# Patient Record
Sex: Female | Born: 1970
Health system: Southern US, Community
[De-identification: ages and names within clinical notes are randomized; demographics above are authoritative.]

## PROBLEM LIST (undated history)

## (undated) DIAGNOSIS — D649 Anemia, unspecified: Secondary | ICD-10-CM

## (undated) DIAGNOSIS — Z853 Personal history of malignant neoplasm of breast: Secondary | ICD-10-CM

## (undated) DIAGNOSIS — C50919 Malignant neoplasm of unspecified site of unspecified female breast: Secondary | ICD-10-CM

## (undated) DIAGNOSIS — M810 Age-related osteoporosis without current pathological fracture: Principal | ICD-10-CM

## (undated) DIAGNOSIS — L509 Urticaria, unspecified: Secondary | ICD-10-CM

## (undated) DIAGNOSIS — K219 Gastro-esophageal reflux disease without esophagitis: Secondary | ICD-10-CM

## (undated) DIAGNOSIS — E785 Hyperlipidemia, unspecified: Secondary | ICD-10-CM

## (undated) DIAGNOSIS — M419 Scoliosis, unspecified: Secondary | ICD-10-CM

## (undated) DIAGNOSIS — Z5189 Encounter for other specified aftercare: Secondary | ICD-10-CM

## (undated) DIAGNOSIS — C801 Malignant (primary) neoplasm, unspecified: Secondary | ICD-10-CM

## (undated) HISTORY — DX: Encounter for other specified aftercare: Z51.89

## (undated) HISTORY — DX: Age-related osteoporosis without current pathological fracture: M81.0

## (undated) HISTORY — DX: Malignant neoplasm of unspecified site of unspecified female breast: C50.919

## (undated) HISTORY — DX: Hyperlipidemia, unspecified: E78.5

## (undated) HISTORY — DX: Urticaria, unspecified: L50.9

## (undated) HISTORY — DX: Personal history of malignant neoplasm of breast: Z85.3

## (undated) HISTORY — PX: MASTECTOMY: SHX3

## (undated) HISTORY — PX: WISDOM TOOTH EXTRACTION: SHX21

## (undated) HISTORY — DX: Malignant (primary) neoplasm, unspecified: C80.1

## (undated) HISTORY — PX: BREAST SURGERY: SHX581

## (undated) HISTORY — PX: REDUCTION MAMMAPLASTY: SUR839

---

## 2004-01-26 ENCOUNTER — Ambulatory Visit (HOSPITAL_COMMUNITY): Admission: RE | Admit: 2004-01-26 | Discharge: 2004-01-26 | Payer: Self-pay | Admitting: Obstetrics and Gynecology

## 2011-08-02 ENCOUNTER — Encounter: Payer: Self-pay | Admitting: Physical Medicine and Rehabilitation

## 2011-08-02 ENCOUNTER — Emergency Department (HOSPITAL_COMMUNITY): Payer: 59

## 2011-08-02 ENCOUNTER — Emergency Department (HOSPITAL_COMMUNITY)
Admission: EM | Admit: 2011-08-02 | Discharge: 2011-08-02 | Disposition: A | Discharge status: Home / Self Care | Payer: 59 | Attending: Emergency Medicine | Admitting: Emergency Medicine

## 2011-08-02 DIAGNOSIS — M549 Dorsalgia, unspecified: Secondary | ICD-10-CM

## 2011-08-02 DIAGNOSIS — M545 Low back pain, unspecified: Secondary | ICD-10-CM | POA: Insufficient documentation

## 2011-08-02 DIAGNOSIS — M4120 Other idiopathic scoliosis, site unspecified: Secondary | ICD-10-CM | POA: Insufficient documentation

## 2011-08-02 HISTORY — DX: Scoliosis, unspecified: M41.9

## 2011-08-02 MED ORDER — ONDANSETRON 4 MG PO TBDP
ORAL_TABLET | ORAL | Status: AC
  Administered 2011-08-02: 4 mg
  Filled 2011-08-02: qty 1

## 2011-08-02 MED ORDER — OXYCODONE-ACETAMINOPHEN 5-325 MG PO TABS: 2.0000 | ORAL_TABLET | ORAL | Status: AC | PRN

## 2011-08-02 MED ORDER — PREDNISONE 10 MG PO TABS: 20.0000 mg | ORAL_TABLET | Freq: Every day | ORAL | Status: DC

## 2011-08-02 MED ORDER — OXYCODONE-ACETAMINOPHEN 5-325 MG PO TABS
2.0000 | ORAL_TABLET | Freq: Once | ORAL | Status: AC
  Administered 2011-08-02: 2 via ORAL
  Filled 2011-08-02: qty 2

## 2011-08-02 MED ORDER — ONDANSETRON 4 MG PO TBDP: 4.0000 mg | ORAL_TABLET | Freq: Once | ORAL | Status: DC

## 2011-08-02 MED ORDER — KETOROLAC TROMETHAMINE 30 MG/ML IJ SOLN
60.0000 mg | Freq: Once | INTRAMUSCULAR | Status: AC
  Administered 2011-08-02: 60 mg via INTRAMUSCULAR
  Filled 2011-08-02: qty 2

## 2011-08-02 NOTE — ED Notes (Addendum)
Pt presents to department for evaluation of lower back pain. Onset this morning. Pt states she got into car and began driving when pain started. 10/10 pain upon arrival to ED. States too painful to walk or bear weight at the time. Pt states she usually doesn't have back problems. She is conscious alert and oriented x4. Denies recent injury to area. Pt has history of scoliosis.

## 2011-08-02 NOTE — ED Notes (Signed)
Pt states she has a history of scolilosis.Marland Kitchenstates she cannot stand due pain. Denies numbness to her legs or weakness in her legs. Denies loss of bowel or bladder control.

## 2011-08-02 NOTE — ED Provider Notes (Signed)
History     CSN: [id number] Arrival date & time: 08/02/2011  6:00 PM   First MD Initiated Contact with Patient 08/02/11 1854      Chief Complaint  Patient presents with  . Back Pain    (Consider location/radiation/quality/duration/timing/severity/associated sxs/prior treatment) HPI  Past Medical History  Diagnosis Date  . Scoliosis     History reviewed. No pertinent past surgical history.  History reviewed. No pertinent family history.  History  Substance Use Topics  . Smoking status: Never Smoker   . Smokeless tobacco: Not on file  . Alcohol Use: No    OB History    Grav Para Term Preterm Abortions TAB SAB Ect Mult Living                  Review of Systems  Allergies  Review of patient's allergies indicates no known allergies.  Home Medications   Current Outpatient Rx  Name Route Sig Dispense Refill  . ACETAMINOPHEN-ASPIRIN BUFFERED 250-250 MG PO TABS Oral Take 1 tablet by mouth every 4 (four) hours as needed. For pain     . CYCLOBENZAPRINE HCL 10 MG PO TABS Oral Take 10 mg by mouth 3 (three) times daily as needed. For muscle spasms     . NAPROXEN 250 MG PO TABS Oral Take 250 mg by mouth 2 (two) times daily as needed. For pain and inflammation       BP 119/76  Pulse 85  Temp(Src) 99 F (37.2 C) (Oral)  Resp 22  SpO2 100%  Physical Exam  ED Course  Procedures (including critical care time)  Labs Reviewed - No data to display Dg Lumbar Spine 2-3 Views  08/02/2011  *RADIOLOGY REPORT*  Clinical Data: Low back pain x1 day, scoliosis  LUMBAR SPINE - 2-3 VIEW  Comparison: None.  Findings: Five lumbar-type vertebral bodies with a partially lumbarized S1.  Normal lumbar lordosis.  Thoracolumbar scoliosis.  No evidence fracture of dislocation.  Vertebral body heights and interverebral disc spaces are maintained.  IMPRESSION: No fracture or dislocation is seen.  Thoracolumbar scoliosis.  Original Report Authenticated By: Charline Bills, M.D.     No  diagnosis found.  Radiology results reviewed and discussed with patient.  MDM         Jimmye Norman, NP 08/13/11 1020

## 2011-08-02 NOTE — ED Notes (Signed)
To car in wheelchair.

## 2011-08-06 NOTE — ED Provider Notes (Signed)
Medical screening examination/treatment/procedure(s) were performed by non-physician practitioner and as supervising physician I was immediately available for consultation/collaboration.  Nicholes Stairs, MD 08/06/11 [phone]

## 2011-08-13 NOTE — ED Provider Notes (Signed)
Medical screening examination/treatment/procedure(s) were performed by non-physician practitioner and as supervising physician I was immediately available for consultation/collaboration.  Nicholes Stairs, MD 08/13/11 1104

## 2012-06-10 ENCOUNTER — Other Ambulatory Visit: Payer: Self-pay | Admitting: Nurse Practitioner

## 2012-06-10 ENCOUNTER — Other Ambulatory Visit: Payer: Self-pay | Admitting: Obstetrics and Gynecology

## 2012-06-10 DIAGNOSIS — Z1231 Encounter for screening mammogram for malignant neoplasm of breast: Secondary | ICD-10-CM

## 2012-07-14 ENCOUNTER — Ambulatory Visit
Admission: RE | Admit: 2012-07-14 | Discharge: 2012-07-14 | Disposition: A | Discharge status: Home / Self Care | Payer: 59 | Source: Ambulatory Visit | Attending: Obstetrics and Gynecology | Admitting: Obstetrics and Gynecology

## 2012-07-14 DIAGNOSIS — Z1231 Encounter for screening mammogram for malignant neoplasm of breast: Secondary | ICD-10-CM

## 2013-08-26 DIAGNOSIS — Z9221 Personal history of antineoplastic chemotherapy: Secondary | ICD-10-CM

## 2013-08-26 HISTORY — DX: Personal history of antineoplastic chemotherapy: Z92.21

## 2013-09-05 HISTORY — PX: PORTACATH PLACEMENT: SHX2246

## 2014-05-09 ENCOUNTER — Other Ambulatory Visit: Payer: Self-pay

## 2014-05-09 DIAGNOSIS — Z1231 Encounter for screening mammogram for malignant neoplasm of breast: Secondary | ICD-10-CM

## 2014-05-24 ENCOUNTER — Ambulatory Visit: Payer: 59

## 2014-05-25 ENCOUNTER — Ambulatory Visit
Admission: RE | Admit: 2014-05-25 | Discharge: 2014-05-25 | Disposition: A | Discharge status: Home / Self Care | Payer: 59 | Source: Ambulatory Visit

## 2014-05-25 DIAGNOSIS — Z1231 Encounter for screening mammogram for malignant neoplasm of breast: Secondary | ICD-10-CM

## 2014-05-26 ENCOUNTER — Other Ambulatory Visit: Payer: Self-pay | Admitting: Obstetrics and Gynecology

## 2014-05-26 DIAGNOSIS — R928 Other abnormal and inconclusive findings on diagnostic imaging of breast: Secondary | ICD-10-CM

## 2014-06-03 ENCOUNTER — Other Ambulatory Visit: Payer: Self-pay | Admitting: Obstetrics and Gynecology

## 2014-06-03 DIAGNOSIS — R928 Other abnormal and inconclusive findings on diagnostic imaging of breast: Secondary | ICD-10-CM

## 2014-06-07 ENCOUNTER — Ambulatory Visit
Admission: RE | Admit: 2014-06-07 | Discharge: 2014-06-07 | Disposition: A | Discharge status: Home / Self Care | Payer: 59 | Source: Ambulatory Visit | Attending: Obstetrics and Gynecology | Admitting: Obstetrics and Gynecology

## 2014-06-07 ENCOUNTER — Other Ambulatory Visit: Payer: Self-pay | Admitting: Obstetrics and Gynecology

## 2014-06-07 DIAGNOSIS — R928 Other abnormal and inconclusive findings on diagnostic imaging of breast: Secondary | ICD-10-CM

## 2014-06-08 ENCOUNTER — Ambulatory Visit
Admission: RE | Admit: 2014-06-08 | Discharge: 2014-06-08 | Disposition: A | Discharge status: Home / Self Care | Payer: 59 | Source: Ambulatory Visit | Attending: Obstetrics and Gynecology | Admitting: Obstetrics and Gynecology

## 2014-06-08 DIAGNOSIS — R928 Other abnormal and inconclusive findings on diagnostic imaging of breast: Secondary | ICD-10-CM

## 2014-06-09 ENCOUNTER — Other Ambulatory Visit: Payer: Self-pay | Admitting: Hematology and Oncology

## 2014-06-09 ENCOUNTER — Other Ambulatory Visit: Payer: Self-pay | Admitting: Obstetrics and Gynecology

## 2014-06-09 ENCOUNTER — Telehealth: Payer: Self-pay | Admitting: *Deleted

## 2014-06-09 DIAGNOSIS — C50911 Malignant neoplasm of unspecified site of right female breast: Secondary | ICD-10-CM

## 2014-06-09 DIAGNOSIS — C50311 Malignant neoplasm of lower-inner quadrant of right female breast: Secondary | ICD-10-CM | POA: Insufficient documentation

## 2014-06-09 NOTE — Telephone Encounter (Signed)
Confirmed [facility] for 06/22/14 at 0800.  Instructions and contact information given.

## 2014-06-17 ENCOUNTER — Ambulatory Visit
Admission: RE | Admit: 2014-06-17 | Discharge: 2014-06-17 | Disposition: A | Discharge status: Home / Self Care | Payer: 59 | Source: Ambulatory Visit | Attending: Hematology and Oncology | Admitting: Hematology and Oncology

## 2014-06-17 MED ORDER — GADOBENATE DIMEGLUMINE 529 MG/ML IV SOLN
19.0000 mL | Freq: Once | INTRAVENOUS | Status: AC | PRN
Start: 2014-06-17 — End: 2014-06-17
  Administered 2014-06-17: 19 mL via INTRAVENOUS

## 2014-06-22 ENCOUNTER — Ambulatory Visit (INDEPENDENT_AMBULATORY_CARE_PROVIDER_SITE_OTHER): Payer: Self-pay | Admitting: Surgery

## 2014-06-22 ENCOUNTER — Encounter (INDEPENDENT_AMBULATORY_CARE_PROVIDER_SITE_OTHER): Payer: Self-pay

## 2014-06-22 ENCOUNTER — Telehealth: Payer: Self-pay | Admitting: Hematology and Oncology

## 2014-06-22 ENCOUNTER — Encounter: Payer: Self-pay | Admitting: Dietician

## 2014-06-22 ENCOUNTER — Ambulatory Visit: Payer: 59 | Attending: Surgery | Admitting: Physical Therapy

## 2014-06-22 ENCOUNTER — Ambulatory Visit (HOSPITAL_BASED_OUTPATIENT_CLINIC_OR_DEPARTMENT_OTHER): Payer: 59 | Admitting: Hematology and Oncology

## 2014-06-22 ENCOUNTER — Encounter: Payer: Self-pay | Admitting: Hematology and Oncology

## 2014-06-22 ENCOUNTER — Other Ambulatory Visit (HOSPITAL_BASED_OUTPATIENT_CLINIC_OR_DEPARTMENT_OTHER): Payer: 59

## 2014-06-22 ENCOUNTER — Encounter: Payer: Self-pay | Admitting: *Deleted

## 2014-06-22 ENCOUNTER — Ambulatory Visit: Payer: 59

## 2014-06-22 ENCOUNTER — Encounter: Payer: Self-pay | Admitting: Radiation Oncology

## 2014-06-22 ENCOUNTER — Other Ambulatory Visit: Payer: Self-pay | Admitting: *Deleted

## 2014-06-22 ENCOUNTER — Ambulatory Visit
Admission: RE | Admit: 2014-06-22 | Discharge: 2014-06-22 | Disposition: A | Discharge status: Home / Self Care | Payer: 59 | Source: Ambulatory Visit | Attending: Radiation Oncology | Admitting: Radiation Oncology

## 2014-06-22 VITALS — BP 123/69 | HR 87 | Temp 99.6°F | Resp 18 | Ht 68.0 in | Wt 199.5 lb

## 2014-06-22 DIAGNOSIS — J986 Disorders of diaphragm: Secondary | ICD-10-CM

## 2014-06-22 DIAGNOSIS — M419 Scoliosis, unspecified: Secondary | ICD-10-CM | POA: Insufficient documentation

## 2014-06-22 DIAGNOSIS — C50919 Malignant neoplasm of unspecified site of unspecified female breast: Secondary | ICD-10-CM | POA: Diagnosis present

## 2014-06-22 DIAGNOSIS — Z170 Estrogen receptor positive status [ER+]: Secondary | ICD-10-CM

## 2014-06-22 DIAGNOSIS — C50311 Malignant neoplasm of lower-inner quadrant of right female breast: Secondary | ICD-10-CM

## 2014-06-22 DIAGNOSIS — Z808 Family history of malignant neoplasm of other organs or systems: Secondary | ICD-10-CM

## 2014-06-22 DIAGNOSIS — R293 Abnormal posture: Secondary | ICD-10-CM | POA: Insufficient documentation

## 2014-06-22 DIAGNOSIS — Z803 Family history of malignant neoplasm of breast: Secondary | ICD-10-CM

## 2014-06-22 LAB — CBC WITH DIFFERENTIAL/PLATELET
BASO%: 0.4 % (ref 0.0–2.0)
Basophils Absolute: 0 10*3/uL (ref 0.0–0.1)
EOS%: 1 % (ref 0.0–7.0)
Eosinophils Absolute: 0.1 10*3/uL (ref 0.0–0.5)
HCT: 39.3 % (ref 34.8–46.6)
HEMOGLOBIN: 12.9 g/dL (ref 11.6–15.9)
LYMPH%: 19.4 % (ref 14.0–49.7)
MCH: 28.3 pg (ref 25.1–34.0)
MCHC: 32.8 g/dL (ref 31.5–36.0)
MCV: 86.3 fL (ref 79.5–101.0)
MONO#: 0.5 10*3/uL (ref 0.1–0.9)
MONO%: 5.6 % (ref 0.0–14.0)
NEUT#: 6.6 10*3/uL — ABNORMAL HIGH (ref 1.5–6.5)
NEUT%: 73.6 % (ref 38.4–76.8)
Platelets: 290 10*3/uL (ref 145–400)
RBC: 4.56 10*6/uL (ref 3.70–5.45)
RDW: 15.5 % — AB (ref 11.2–14.5)
WBC: 8.9 10*3/uL (ref 3.9–10.3)
lymph#: 1.7 10*3/uL (ref 0.9–3.3)

## 2014-06-22 LAB — COMPREHENSIVE METABOLIC PANEL (CC13)
ALT: 14 U/L (ref 0–55)
AST: 10 U/L (ref 5–34)
Albumin: 3.8 g/dL (ref 3.5–5.0)
Alkaline Phosphatase: 97 U/L (ref 40–150)
Anion Gap: 9 mEq/L (ref 3–11)
BUN: 10.4 mg/dL (ref 7.0–26.0)
CALCIUM: 9.6 mg/dL (ref 8.4–10.4)
CO2: 24 mEq/L (ref 22–29)
Chloride: 108 mEq/L (ref 98–109)
Creatinine: 0.9 mg/dL (ref 0.6–1.1)
Glucose: 98 mg/dl (ref 70–140)
Potassium: 3.6 mEq/L (ref 3.5–5.1)
Sodium: 141 mEq/L (ref 136–145)
TOTAL PROTEIN: 7.3 g/dL (ref 6.4–8.3)
Total Bilirubin: 0.63 mg/dL (ref 0.20–1.20)

## 2014-06-22 NOTE — Progress Notes (Signed)
[facility] Psychosocial Distress Screening Clinical Social Work  Patient completed distress screening protocol and scored a 6 on the Psychosocial Distress Thermometer which indicates moderate distress. Clinical Social Worker met with patient in [facility] to assess for distress and other psychosocial needs.  Patient expressed feeling overwhelmed but stated "it was helpful" meeting with the physicians and getting more information on her treatment plan.  CSW and patient discussed common emotional responses to being diagnosed with cancer and the importance of support.  Patient expressed some concern for her 55 year old son and his adjustment to her diagnosis.  Patient also identified her family and a source of support.  CSW informed patient on the support team and support services at [facility], and patient was agreeable to an alight guide referral.  CSW encouraged patient and family to call with any questions or concerns.       Clinical Social Worker follow up needed: Yes.    If yes, follow up plan: Alight Guide referral   [place] 06/22/2014  Screening Type Initial Screening  Mark the number that describes how much distress you have been experiencing in the past week 6  Practical problem type Insurance;Work/school  Family Problem type Children  Emotional problem type Nervousness/Anxiety  Physician notified of physical symptoms Yes  Referral to clinical social work Yes  Referral to support programs Yes   Johnnye Lana, MSW, LCSW, OSW-C Clinical Social Worker [place] [phone]

## 2014-06-22 NOTE — Progress Notes (Signed)
[facility] CONSULT NOTE  Patient Care Team: No Pcp Per Patient as PCP - General (General Practice) Alphonsa Overall, MD as Consulting Physician (General Surgery) Rulon Eisenmenger, MD as Consulting Physician (Hematology and Oncology) Blair Promise, MD as Consulting Physician (Radiation Oncology)  CHIEF COMPLAINTS/PURPOSE OF CONSULTATION:  Newly diagnosed breast cancer  HISTORY OF PRESENTING ILLNESS:  Lydia Stevens 43 y.o. female is here because of recent diagnosis of right-sided breast cancer. She had a regular screening mammogram that revealed a 9 mm abnormality in the right breast which was further evaluated with an ultrasound and a breast MRI on 06/17/2014. The right breast shadow 1 cm biopsy-proven invasive ductal carcinoma with DCIS. The left breast just 7 mm fibroadenoma. Patient that there was a left upper quadrant abdominal diaphragmatic mass measuring 3 x 1.1 cm in size, unclear significance. She was presented this morning at the multidisciplinary tumor board and she is here today to discuss the treatment plan.  I reviewed her records extensively and collaborated the history with the patient.  SUMMARY OF ONCOLOGIC HISTORY:   Breast cancer of lower-inner quadrant of right female breast   06/07/2014 Initial Biopsy Right breast needle biopsy 5:00 position: Invasive ductal carcinoma with DCIS, ER 100%, PR 71%, Ki-67 33%, HER-2 negative ratio 1.03   06/17/2014 Breast MRI Right breast: 10 x 7 x 5 mm biopsy-proven IDC with DCIS, left breast 7 x 7 x 7 mm fibroadenoma, left upper quadrant of the abdomen abutting the peritoneum 3 x 1.1 cm oval soft tissue mass    In terms of breast cancer risk profile:  She menarched at early age of 36  She had one pregnancy, her first child was born at age 39  She is currently on Depakote shots for unknown time in the [place] She was never exposed to fertility medications or hormone replacement therapy.  She has  family history of  Breast/GYN/GI cancer; 2 half-sisters with breast cancer  MEDICAL HISTORY:  Past Medical History  Diagnosis Date  . Scoliosis   . Breast cancer     SURGICAL HISTORY: History reviewed. No pertinent past surgical history.  SOCIAL HISTORY: History   Social History  . Marital Status: Single    Spouse Name: N/A    Number of Children: N/A  . Years of Education: N/A   Occupational History  . Not on file.   Social History Main Topics  . Smoking status: Never Smoker   . Smokeless tobacco: Not on file  . Alcohol Use: No  . Drug Use: No  . Sexual Activity: Not on file   Other Topics Concern  . Not on file   Social History Narrative  . No narrative on file    FAMILY HISTORY: Family History  Problem Relation Age of Onset  . Breast cancer Paternal Aunt   . Breast cancer Paternal Aunt     ALLERGIES:  has No Known Allergies.  MEDICATIONS:  Current Outpatient Prescriptions  Medication Sig Dispense Refill  . medroxyPROGESTERone (DEPO-SUBQ PROVERA 104) 104 MG/0.65ML injection Inject 104 mg into the skin every 3 (three) months.       No current facility-administered medications for this visit.    REVIEW OF SYSTEMS:   Constitutional: Denies fevers, chills or abnormal night sweats Eyes: Denies blurriness of vision, double vision or watery eyes Ears, nose, mouth, throat, and face: Denies mucositis or sore throat Respiratory: Denies cough, dyspnea or wheezes Cardiovascular: Denies palpitation, chest discomfort or lower extremity swelling Gastrointestinal:  Denies  nausea, heartburn or change in bowel habits Skin: Denies abnormal skin rashes Lymphatics: Denies new lymphadenopathy or easy bruising Neurological:Denies numbness, tingling or new weaknesses Behavioral/Psych: Mood is stable, no new changes  Breast:  Denies any palpable lumps or discharge All other systems were reviewed with the patient and are negative.  PHYSICAL EXAMINATION: ECOG PERFORMANCE STATUS: 0 -  Asymptomatic  Filed Vitals:   06/22/14 0853  BP: 123/69  Pulse: 87  Temp: 99.6 F (37.6 C)  Resp: 18   Filed Weights   06/22/14 0853  Weight: 199 lb 8 oz (90.493 kg)    GENERAL:alert, no distress and comfortable SKIN: skin color, texture, turgor are normal, no rashes or significant lesions EYES: normal, conjunctiva are pink and non-injected, sclera clear OROPHARYNX:no exudate, no erythema and lips, buccal mucosa, and tongue normal  NECK: supple, thyroid normal size, non-tender, without nodularity LYMPH:  no palpable lymphadenopathy in the cervical, axillary or inguinal LUNGS: clear to auscultation and percussion with normal breathing effort HEART: regular rate & rhythm and no murmurs and no lower extremity edema ABDOMEN:abdomen soft, non-tender and normal bowel sounds Musculoskeletal:no cyanosis of digits and no clubbing  PSYCH: alert & oriented x 3 with fluent speech NEURO: no focal motor/sensory deficits BREAST: No palpable nodules in breast. No palpable axillary or supraclavicular lymphadenopathy  LABORATORY DATA:  I have reviewed the data as listed Lab Results  Component Value Date   WBC 8.9 06/22/2014   HGB 12.9 06/22/2014   HCT 39.3 06/22/2014   MCV 86.3 06/22/2014   PLT 290 06/22/2014   Lab Results  Component Value Date   NA 141 06/22/2014   K 3.6 06/22/2014   CO2 24 06/22/2014    RADIOGRAPHIC STUDIES: I have personally reviewed the radiological reports and agreed with the findings in the report.  ASSESSMENT AND PLAN:  Breast cancer of lower-inner quadrant of right female breast Right breast invasive ductal carcinoma 9 mm by ultrasound T1 B. N0 M0 stage IA clinical stage ER/PR positive HER-2 negative Ki-67 33%, grade 3 with DCIS, strong family history of breast cancer  Pathology counseling:Discussed with the patient, the details of pathology including the type of breast cancer,the clinical staging, the significance of ER, PR and HER-2/neu receptors and the  implications for treatment. After reviewing the pathology in detail, we proceeded to discuss the different treatment options between surgery, radiation, chemotherapy, antiestrogen therapies.  Recommendation: Breast conserving surgery lumpectomy followed by evaluation for Oncotype DX and determine if she needs chemotherapy. If she does not need chemotherapy procedure radiation followed by antiestrogen therapy.  Oncotype DX counseling I explained to the patient that this is a 17 gene panel to evaluate patient tumors DNA and determine using a recurrence score, regarding prognosis and prediction for chemotherapy benefit. She understands that if her tumor was found to be high risk, she would benefit from systemic chemotherapy. If she was low risk, there would not be any need or benefit from chemotherapy. If she was found to be intermediate risk, we would need to evaluate the score as well as other risk factors and determine if an abbreviated chemotherapy may be of benefit.  Diaphragmatic mass: PET/CT will be obtained. If PET is negative we will watch this and monitor it. If it is positive, we will need to set her up for interventional radiology guided biopsy versus laparoscopic biopsy.   I would like to discontinue Depo-Provera shots and prescribe her once a month Zoladex injections for ovarian suppression.  All questions were answered. The patient  knows to call the clinic with any problems, questions or concerns. I spent 55 minutes counseling the patient face to face. The total time spent in the appointment was 60 minutes and more than 50% was on counseling.     Rulon Eisenmenger, MD 06/22/2014 11:36 AM

## 2014-06-22 NOTE — Progress Notes (Signed)
Patient was seen by RD during [facility] on 06/22/2014  Provided pt with folder of educational materials regarding general nutrition recommendations for breast cancer patients, plant-based diets, antioxidants, cancer facts vs myths, and information on organic foods  Explained importance of healthy nutrition during treatments and encouraged pt to consume daily recommended amount of fruits and vegetables, emphasizing variety of intake for maximum antioxidant and synergistic health benefits. Promoted adequate fiber intake, with use of whole grain and whole wheat products, beans, and lentils. Encouraged patient to follow a low fat diet with use of heart healthy fats, and to opt for plant-based proteins weekly  Recommended pt maintain healthy weight during treatments, and encouraged gradual weight loss as warranted after procedures.  Diet recall indicated regular meals and snacks but "unhealthy" per patient.  Patient had questions regarding weight  Expect good compliance  Provided pt with outpatient oncology RD contact information. Encouraged pt to contact RD with additional follow up questions or nutrition-related concerns.  Antonieta Iba, RD, LDN Clinical Inpatient Dietitian Pager:  [phone] Weekend and after hours pager:  [phone]

## 2014-06-22 NOTE — Progress Notes (Signed)
Checked in new patient with no financial issues prior to seeing the dr. She has appt card and [place].. She has appt card and I gave her packet. She has my card to call if any asst is needed.

## 2014-06-22 NOTE — Telephone Encounter (Signed)
, °

## 2014-06-22 NOTE — H&P (Signed)
Re:   Lydia Stevens DOB:   [birth year] MRN:   [id number]  [facility]  ASSESSMENT AND PLAN: 1.  Right breast cancer - 5 o'clock  Treating oncologist - [facility]  Biopsy - 06/07/2014 - ([id number]) - IDC, Grade 3, ER - 100%, PR - 71%, Her2Neu - negative, Ki67 - 33%  I discussed the options for breast cancer treatment with the patient.  She is in the multidisciplinary clinic and understands the treatment of breast cancer includes medical oncology and radiation oncology.  I discussed the surgical options of lumpectomy vs. mastectomy.  If mastectomy, there is the possibility of reconstruction.   I discussed the options of lymph node biopsy.  The treatment plan depends on the pathologic staging of the tumor and the patient's personal wishes.  The risks of surgery include, but are not limited to, bleeding, infection, the need for further surgery, and nerve injury.  The patient has been given literature on the treatment of breast cancer.  Plan:  1.)  PET/CT for abdominal mass, 2) Genetics, 3) Right breast lumpectomy and right axillary SLNBx   2.  LUQ subdiaphragmatic mass on MRI  Plan PET/CT  3.  Fibroadenoma - Lower inner left breast 4.  Scoliosis 5.  History of anemia/blood transfusion when young, but no current problem  No chief complaint on file.  REFERRING PHYSICIAN: No PCP Per Patient  HISTORY OF PRESENT ILLNESS: Lydia Stevens is a 43 y.o. (DOB: 02/20/1971)  AA  female whose primary care physician is No PCP Per Patient and comes to the [facility] today for a new right breast cancer. She comes by herself.  Her husband is at home resting - he works 3rd shift.  She has 2 half sisters who have had breast cancer.  Both had mastectomies.  She is unsure of their genetic tests.  Mammogram - 06/07/2014 - 1. Indeterminate right breast mass at 5 o'clock, 10 cm from the  nipple. 2. Benign cluster of cysts within the right axillary tail. 3. Indeterminate left breast  mass at 9 o'clock, 10 cm from the nipple.  MRI - 06/17/2014 - 1. 10 x 7 x 5 mm biopsy-proven invasive ductal carcinoma and ductal  carcinoma in situ in the posterior aspect of the lower inner  quadrant of the right breast.  2. 7 x 7 x 7 mm biopsy-proven fibroadenoma in the posterior aspect of the lower inner quadrant of the left breast.  3. 3.0 x 1.1 cm oval soft tissue mass in the anterior aspect of the left upper quadrant of the abdomen, abutting the peritoneum. Differential considerations include unusual accessory splenic tissue and peritoneal malignancy. Further evaluation with an abdomen and pelvis CT with contrast or PET-CT is recommended. Biopsy 06/07/2014 showed right breast cancer.    Past Medical History  Diagnosis Date  . Scoliosis      No past surgical history on file.    Current Outpatient Prescriptions  Medication Sig Dispense Refill  . Acetaminophen-Aspirin Buffered (EXCEDRIN BACK & BODY) 250-250 MG tablet Take 1 tablet by mouth every 4 (four) hours as needed. For pain       . cyclobenzaprine (FLEXERIL) 10 MG tablet Take 10 mg by mouth 3 (three) times daily as needed. For muscle spasms       . naproxen (NAPROSYN) 250 MG tablet Take 250 mg by mouth 2 (two) times daily as needed. For pain and inflammation       . predniSONE (DELTASONE) 10 MG tablet  Take 2 tablets (20 mg total) by mouth daily.  16 tablet  0   No current facility-administered medications for this visit.     No Known Allergies  REVIEW OF SYSTEMS: Skin:  No history of rash.  No history of abnormal moles. Infection:  No history of hepatitis or HIV.  No history of MRSA. Neurologic:  No history of stroke.  No history of seizure.  No history of headaches. Cardiac:  No history of hypertension. No history of heart disease.   Pulmonary:  Does not smoke cigarettes.  No asthma or bronchitis.  No OSA/CPAP.  Endocrine:  No diabetes. No thyroid disease. Gastrointestinal:  No history of stomach disease.  No history of  liver disease.  No history of gall bladder disease.  No history of pancreas disease.  No history of colon disease. Urologic:  No history of kidney stones.  No history of bladder infections. GYN:  She gets Depo shots for birth control.  She is up for one soon. I told her to talk to Dr. Lindi Adie before doing this. Musculoskeletal:  Scoliosis.  Has a TENS unit for her back. Hematologic:  No bleeding disorder.  No history of anemia.  Not anticoagulated. Psycho-social:  The patient is oriented.   The patient has no obvious psychologic or social impairment to understanding our conversation and plan.  SOCIAL and FAMILY HISTORY: Married. Husband works 3rd shift and he stayed at home. Works as Librarian, academic for Therapist, art at [employer]. Has 57 yo son   PHYSICAL EXAM: There were no vitals taken for this visit.  General: AA F who is alert and generally healthy appearing. She is soft spoken. HEENT: Normal. Pupils equal. Neck: Supple. No mass.  No thyroid mass. Lymph Nodes:  No supraclavicular or cervical or axillary nodes. Lungs: Clear to auscultation and symmetric breath sounds. Heart:  RRR. No murmur or rub. Breast:  Right - bruise at 5 o'clock (maybe some small mass effect)  Left - bruise at 9 o'clock (maybe some small mass effect)  Abdomen: Soft. No mass. No tenderness. No hernia. Normal bowel sounds.  No abdominal scars. Rectal: Not done. Extremities:  Good strength and ROM  in upper and lower extremities. Neurologic:  Grossly intact to motor and sensory function. Psychiatric: Has normal mood and affect. Behavior is normal.   DATA REVIEWED: Epic notes  Alphonsa Overall, MD,  [facility] [address] [place]  [place], [place]    [place] Phone:  [place]:  [phone]

## 2014-06-22 NOTE — Progress Notes (Signed)
Radiation Oncology         [phone] ________________________________  Initial outpatient Consultation  Name: Lydia Stevens MRN: [id number]  Date: 06/22/2014  DOB: 1971-04-06  CC:No PCP Per Patient  Lydia Overall, MD   REFERRING PHYSICIAN: Alphonsa Overall, MD  DIAGNOSIS: Clinical stage IA invasive ductal carcinoma of the right breast presenting in the lower inner quadrant  HISTORY OF PRESENT ILLNESS::Lydia Stevens is a 43 y.o. female who is seen out courtesy of Dr. Alphonsa Stevens for an opinion concerning radiation therapy as part of management of patient's recently diagnosed right breast cancer. Earlier this year on routine screening mammography the patient was noted to have a indeterminate mass within the 5:00 position of the right breast.. In addition the patient was noted to have a lesion in the 9 clock position of the left breast.  the patient proceeded to undergo needle core biopsy of the left breast at the 9 clock position which revealed a fibroadenoma with no evidence of malignancy. Biopsy from the right breast, 5:00 position revealed invasive ductal carcinoma likely grade 3. There was some associated ductal carcinoma in situ. The patient did undergo ultrasound-guided   Aspiration of a cyst within the right axillary tail. The lesion within the right breast was estrogen receptor positive at 100%, progesterone receptor positive at 71% and proliferation marker of 33%. There was no HER-2/neu amplification. An MRI of the chest confirmed the solitary lesion within the lower inner aspect of the right breast. Also confirmed the biopsy-proven fibroadenoma within the lower inner aspect of the left breast. In addition there was a 3.0 x 1.1 cm oval mass within the anterior aspect of the left upper quadrant of abdomen, abutting the peritoneum.  With These findings the patient is now seen in the multidisciplinary breast clinic.  PREVIOUS RADIATION THERAPY: No  PAST MEDICAL HISTORY:  has a  past medical history of Scoliosis and Breast cancer.    PAST SURGICAL HISTORY:History reviewed. No pertinent past surgical history.  FAMILY HISTORY: family history includes Breast cancer in her paternal aunt and paternal aunt.  SOCIAL HISTORY:  reports that she has never smoked. She does not have any smokeless tobacco history on file. She reports that she does not drink alcohol or use illicit drugs. she lives in the [place] area with her husband. She has 41 year old son. 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  MEDICATIONS:  Current Outpatient Prescriptions  Medication Sig Dispense Refill  . medroxyPROGESTERone (DEPO-SUBQ PROVERA 104) 104 MG/0.65ML injection Inject 104 mg into the skin every 3 (three) months.       No current facility-administered medications for this encounter.    REVIEW OF SYSTEMS:  A 15 point review of systems is documented in the electronic medical record. This was obtained by the nursing staff. However, I reviewed this with the patient to discuss relevant findings and make appropriate changes.  Prior to diagnosis the patient denied any pain within the right breast area, left breast area, nipple discharge or bleeding. She denies any new bony pain headaches dizziness or blurred vision.   PHYSICAL EXAM:  Vitals - 1 value per visit 61/44/3154  SYSTOLIC 008  DIASTOLIC 69  Pulse 87  Temperature 99.6  Respirations 18  Weight (lb) 199.5  Height 5' 8"   BMI 30.34  VISIT REPORT   In Gen. this is a very pleasant 43 year old female in no acute distress. Examination of the  neck and supraclavicular region reveals no evidence of adenopathy. Axillary areas are free of adenopathy. Examination of the lungs reveals them to be clear. The heart has a regular rhythm and rate. Examination of the left breast reveals no palpable mass nipple discharge or bleeding.  Examination of the right breast reveals  a 1 cm palpable mass within the lower inner quadrant of the right breast. There some Steri-Strips in place medial to this palpable lesion. No other lesions are palpable within the right breast nipple discharge or bleeding.    ECOG = 0  0 - Asymptomatic (Fully active, able to carry on all predisease activities without restriction)  LABORATORY DATA:  Lab Results  Component Value Date   WBC 8.9 06/22/2014   HGB 12.9 06/22/2014   HCT 39.3 06/22/2014   MCV 86.3 06/22/2014   PLT 290 06/22/2014   NEUTROABS 6.6* 06/22/2014   Lab Results  Component Value Date   NA 141 06/22/2014   K 3.6 06/22/2014   CO2 24 06/22/2014   GLUCOSE 98 06/22/2014   CREATININE 0.9 06/22/2014   CALCIUM 9.6 06/22/2014      RADIOGRAPHY: Mr Breast Bilateral W Wo Contrast  06/17/2014   CLINICAL DATA:  Recently diagnosed right breast invasive ductal carcinoma and ductal carcinoma in situ. Recently biopsied left breast fibroadenoma.  LABS:  None obtained today.  EXAM: BILATERAL BREAST MRI WITH AND WITHOUT CONTRAST  TECHNIQUE: Multiplanar, multisequence MR images of both breasts were obtained prior to and following the intravenous administration of 53m of MultiHance.  THREE-DIMENSIONAL MR IMAGE RENDERING ON INDEPENDENT WORKSTATION:  Three-dimensional MR images were rendered by post-processing of the original MR data on an independent workstation. The three-dimensional MR images were interpreted, and findings are reported in the following complete MRI report for this study. Three dimensional images were evaluated at the independent DynaCad workstation  COMPARISON:  Previous mammogram, ultrasound and biopsy examinations.  FINDINGS: Breast composition: c:  Heterogeneous fibroglandular tissue  Background parenchymal enhancement: Mild to moderate with a nodular pattern  Right breast: 10 x 7 x 5 mm mildly irregular enhancing mass containing a biopsy marker clip in the lower inner quadrant  of the right breast, posteriorly. This has a mixture of enhancement kinetics, including rapid wash-in/washout.  Left breast: 7 x 7 x 7 mm rounded, circumscribed enhancing mass in the posterior aspect of the lower inner quadrant of the left breast, with an adjacent biopsy marker clip artifact.  Lymph nodes: No abnormal appearing lymph nodes.  Ancillary findings: There is a 3.0 x 1.1 cm elongated, oval, circumscribed mass in the anterior aspect of the upper abdomen on the left, abutting the peritoneum. This has signal characteristics similar to the liver and spleen on various sequences and diffusely enhances.  IMPRESSION: 1. 10 x 7 x 5 mm biopsy-proven invasive ductal carcinoma and ductal carcinoma in situ in the posterior aspect of the lower inner quadrant of the right breast. 2. 7 x 7 x 7 mm biopsy-proven fibroadenoma in the posterior aspect of the lower inner quadrant of the left breast. 3. 3.0 x 1.1 cm oval soft tissue mass in the anterior aspect of the left upper quadrant of the abdomen, abutting the peritoneum. Differential considerations include unusual accessory splenic tissue and peritoneal malignancy. Further evaluation with an abdomen and pelvis CT with contrast or PET-CT is recommended.  RECOMMENDATION: 1. CT of the abdomen and pelvis with contrast or PET-CT to evaluate the left upper quadrant abdominal peritoneal mass. 2. Treatment plan for the biopsy proven malignancy  in the right breast.  BI-RADS CATEGORY  6: Known biopsy-proven malignancy%period%   Electronically Signed   By: Enrique Sack M.D.   On: 06/17/2014 14:00   US Aspiration  06/20/2014   ADDENDUM REPORT: 06/20/2014 17:48  ADDENDUM: Recommendation: No further follow-up is needed for the aspirated cluster of cysts. The patient has an appointment for surgical/oncology consultation on 06/22/2014 for her right breast cancer.   Electronically Signed   By: Pamelia Hoit M.D.   On: 06/20/2014 17:48   06/20/2014   CLINICAL DATA:  43 year old female  for aspiration of a cluster of cysts within the right axillary tail  EXAM: ULTRASOUND GUIDED RIGHT BREAST CYST ASPIRATION  COMPARISON:  Previous exams.  PROCEDURE: Using sterile technique, 2% lidocaine, ultrasound guidance, and an 18 gauge spinal needle, aspiration was performed of a cluster of cysts within the right axillary tail. The cluster of cysts completely collapsed after aspiration. Post aspiration mammogram confirms absence of an asymmetry within the right axillary tail which was thought to correspond to the cluster of cysts seen on ultrasound.  IMPRESSION: Ultrasound-guided aspiration of a cluster of cysts within the right axillary tail. No apparent complications.  Electronically Signed: By: Pamelia Hoit M.D. On: 06/07/2014 17:01   Mm Digital Diagnostic Bilat  06/07/2014   CLINICAL DATA:  43 year old female status post ultrasound-guided bilateral breast biopsies  EXAM: DIAGNOSTIC BILATERAL MAMMOGRAM POST ULTRASOUND BIOPSY  COMPARISON:  Previous exams  FINDINGS: Mammographic images were obtained following ultrasound guided biopsy of of bilateral breast masses. Post biopsy images confirm accurate placement of a coil clip within the right breast mass at 5 o'clock. Post biopsy images demonstrate a heart shaped clip to be approximately 2 mm anterior to the mass seen at 9 o'clock.  IMPRESSION: 1. Accurate placement of a coil clip within the right breast mass status post ultrasound-guided biopsy. 2. A heart shaped clip is approximately 2 mm anterior to the left breast mass at 9 o'clock status post ultrasound-guided biopsy.  Final Assessment: Post Procedure Mammograms for Marker Placement   Electronically Signed   By: Pamelia Hoit M.D.   On: 06/07/2014 11:43   Mm Digital Diagnostic Bilat  06/07/2014   CLINICAL DATA:  43 year old female, callback from screening mammogram for possible bilateral breast masses  EXAM: DIGITAL DIAGNOSTIC  BILATERAL MAMMOGRAM WITH CAD  ULTRASOUND BILATERAL BREAST  COMPARISON:   05/25/2014, 07/15/2012  ACR Breast Density Category c: The breast tissue is heterogeneously dense, which may obscure small masses.  FINDINGS: On spot compression views of the right breast, there is an irregular mass within the lower, inner quadrant corresponding to the mass seen on screening mammogram. On spot compression views of the left breast, there is a partially circumscribed, partially obscured mass within the medial left breast.  Mammographic images were processed with CAD.  Targeted physical exam of the lower inner quadrants of both breasts demonstrates no discrete mass.  Targeted ultrasound of the lower, inner right breast demonstrates a 9 x 7 x 7 mm irregular, hypoechoic mass at 5 o'clock, 10 cm from the nipple. No abnormal lymph nodes are seen within the right axilla. A cluster of cysts is incidentally imaged within the axillary tail measuring approximately 8 x 6 x 4 mm.  Targeted ultrasound of the lower, inner quadrant of the left breast demonstrates a 5 x 4 x 4 mm hypoechoic mass at 9 o'clock, 10 cm from the nipple with indistinct margins and increased through transmission. This may represent a complicated cyst versus solid mass. No  abnormal lymph nodes are seen within the left axilla.  IMPRESSION: 1. Indeterminate right breast mass at 5 o'clock, 10 cm from the nipple. 2. Benign cluster of cysts within the right axillary tail. 3. Indeterminate left breast mass at 9 o'clock, 10 cm from the nipple.  RECOMMENDATION: 1. Ultrasound-guided biopsy of the right breast mass at 5 o'clock. The patient desires to have the cluster of cysts aspirated within the axillary tail at the time of right breast biopsy. 2. Ultrasound-guided biopsy of the left breast mass at 9 o'clock.  I have discussed the findings and recommendations with the patient. Results were also provided in writing at the conclusion of the visit. If applicable, a reminder letter will be sent to the patient regarding the next appointment.  BI-RADS  CATEGORY  4: Suspicious.   Electronically Signed   By: Pamelia Hoit M.D.   On: 06/07/2014 11:37   Mm Digital Screening Bilateral  05/25/2014   CLINICAL DATA:  Screening.  EXAM: DIGITAL SCREENING BILATERAL MAMMOGRAM WITH CAD  COMPARISON:  Previous Exam(s)  ACR Breast Density Category c: The breast tissue is heterogeneously dense, which may obscure small masses.  FINDINGS: In the left breast possible mass warrants further imaging evaluation with spot compression views and possibly ultrasound. In the right breast, a possible mass warrants further imaging evaluation with spot compression views and possibly ultrasound. Images were processed with CAD.  IMPRESSION: Further imaging evaluation is suggested for possible mass in the left breast.  Further imaging evaluation is suggested for possible mass in the right breast.  RECOMMENDATION: Diagnostic mammogram and possibly ultrasound of both breasts. (Code:FI-B-81M)  The patient will be contacted regarding the findings, and additional imaging will be scheduled.  BI-RADS CATEGORY  0: Incomplete. Need additional imaging evaluation and/or prior mammograms for comparison.   Electronically Signed   By: Luberta Robertson M.D.   On: 05/25/2014 16:29   Mm Radiologist Eval And Mgmt  06/08/2014   CHIEF COMPLAINT: 43 year old female returns for results of ultrasound-guided biopsies of a right breast mass and a left breast mass. Patient has no complaints from the biopsies.  Current Pain Level: 1  EXAM: ESTABLISHED PATIENT OFFICE VISIT - LEVEL III  HISTORY OF PRESENT ILLNESS: 43 year old female with suspicious 7 x 9 mm mass in the inner lower right breast and indeterminate 5 mm mass in the outer left breast. Both of these areas were biopsied with ultrasound guidance and the patient returns today for results.  REVIEW OF SYSTEMS: Patient has no breast complaints today. Patient denies fever, swelling or bleeding from biopsy sites.  FINDINGS: The right breast biopsy site is clean and dry.  There is no evidence of hematoma or infection.  The left breast biopsy site is clean and dry. There is no evidence of hematoma or infection.  PATHOLOGY: RIGHT BREAST:  INVASIVE DUCTAL CARCINOMA AND DCIS.  LEFT BREAST: FIBROADENOMA WITHOUT ATYPIA/MALIGNANCY.  The findings and results were discussed in detail with the patient and her questions answered.  ASSESSMENT AND PLAN: ASSESSMENT AND PLAN Recommend surgery/oncology consultation. The patient and her family desire treatment in [place]. An appointment at the breast cancer multi-disciplinary clinic has been scheduled for 06/22/2014 and the patient informed.  Recommend bilateral breast MRI and will be scheduled by our office.   Electronically Signed   By: Hassan Rowan M.D.   On: 06/08/2014 16:36   US Breast Ltd Uni Left Inc Axilla  06/07/2014   CLINICAL DATA:  43 year old female, callback from screening mammogram for possible bilateral breast  masses  EXAM: DIGITAL DIAGNOSTIC  BILATERAL MAMMOGRAM WITH CAD  ULTRASOUND BILATERAL BREAST  COMPARISON:  05/25/2014, 07/15/2012  ACR Breast Density Category c: The breast tissue is heterogeneously dense, which may obscure small masses.  FINDINGS: On spot compression views of the right breast, there is an irregular mass within the lower, inner quadrant corresponding to the mass seen on screening mammogram. On spot compression views of the left breast, there is a partially circumscribed, partially obscured mass within the medial left breast.  Mammographic images were processed with CAD.  Targeted physical exam of the lower inner quadrants of both breasts demonstrates no discrete mass.  Targeted ultrasound of the lower, inner right breast demonstrates a 9 x 7 x 7 mm irregular, hypoechoic mass at 5 o'clock, 10 cm from the nipple. No abnormal lymph nodes are seen within the right axilla. A cluster of cysts is incidentally imaged within the axillary tail measuring approximately 8 x 6 x 4 mm.  Targeted ultrasound of the lower,  inner quadrant of the left breast demonstrates a 5 x 4 x 4 mm hypoechoic mass at 9 o'clock, 10 cm from the nipple with indistinct margins and increased through transmission. This may represent a complicated cyst versus solid mass. No abnormal lymph nodes are seen within the left axilla.  IMPRESSION: 1. Indeterminate right breast mass at 5 o'clock, 10 cm from the nipple. 2. Benign cluster of cysts within the right axillary tail. 3. Indeterminate left breast mass at 9 o'clock, 10 cm from the nipple.  RECOMMENDATION: 1. Ultrasound-guided biopsy of the right breast mass at 5 o'clock. The patient desires to have the cluster of cysts aspirated within the axillary tail at the time of right breast biopsy. 2. Ultrasound-guided biopsy of the left breast mass at 9 o'clock.  I have discussed the findings and recommendations with the patient. Results were also provided in writing at the conclusion of the visit. If applicable, a reminder letter will be sent to the patient regarding the next appointment.  BI-RADS CATEGORY  4: Suspicious.   Electronically Signed   By: Pamelia Hoit M.D.   On: 06/07/2014 11:37   US Breast Ltd Uni Right Inc Axilla  06/07/2014   CLINICAL DATA:  43 year old female, callback from screening mammogram for possible bilateral breast masses  EXAM: DIGITAL DIAGNOSTIC  BILATERAL MAMMOGRAM WITH CAD  ULTRASOUND BILATERAL BREAST  COMPARISON:  05/25/2014, 07/15/2012  ACR Breast Density Category c: The breast tissue is heterogeneously dense, which may obscure small masses.  FINDINGS: On spot compression views of the right breast, there is an irregular mass within the lower, inner quadrant corresponding to the mass seen on screening mammogram. On spot compression views of the left breast, there is a partially circumscribed, partially obscured mass within the medial left breast.  Mammographic images were processed with CAD.  Targeted physical exam of the lower inner quadrants of both breasts demonstrates no discrete  mass.  Targeted ultrasound of the lower, inner right breast demonstrates a 9 x 7 x 7 mm irregular, hypoechoic mass at 5 o'clock, 10 cm from the nipple. No abnormal lymph nodes are seen within the right axilla. A cluster of cysts is incidentally imaged within the axillary tail measuring approximately 8 x 6 x 4 mm.  Targeted ultrasound of the lower, inner quadrant of the left breast demonstrates a 5 x 4 x 4 mm hypoechoic mass at 9 o'clock, 10 cm from the nipple with indistinct margins and increased through transmission. This may represent a complicated cyst  versus solid mass. No abnormal lymph nodes are seen within the left axilla.  IMPRESSION: 1. Indeterminate right breast mass at 5 o'clock, 10 cm from the nipple. 2. Benign cluster of cysts within the right axillary tail. 3. Indeterminate left breast mass at 9 o'clock, 10 cm from the nipple.  RECOMMENDATION: 1. Ultrasound-guided biopsy of the right breast mass at 5 o'clock. The patient desires to have the cluster of cysts aspirated within the axillary tail at the time of right breast biopsy. 2. Ultrasound-guided biopsy of the left breast mass at 9 o'clock.  I have discussed the findings and recommendations with the patient. Results were also provided in writing at the conclusion of the visit. If applicable, a reminder letter will be sent to the patient regarding the next appointment.  BI-RADS CATEGORY  4: Suspicious.   Electronically Signed   By: Pamelia Hoit M.D.   On: 06/07/2014 11:37   [name] Lt Breast Bx W Loc Dev 1st Lesion Img Bx Spec US Guide  06/07/2014   CLINICAL DATA:  43 year old female with indeterminate left breast mass  EXAM: ULTRASOUND GUIDED LEFT BREAST CORE NEEDLE BIOPSY  COMPARISON:  Previous exams.  PROCEDURE: I met with the patient and we discussed the procedure of ultrasound-guided biopsy, including benefits and alternatives. We discussed the high likelihood of a successful procedure. We discussed the risks of the procedure including infection,  bleeding, tissue injury, clip migration, and inadequate sampling. Informed written consent was given. The usual time-out protocol was performed immediately prior to the procedure.  Using sterile technique and 2% Lidocaine as local anesthetic, under direct ultrasound visualization, a 12 gauge vacuum-assisteddevice was used to perform biopsy of an indeterminate left breast mass at 9 o'clock using a medial approach. At the conclusion of the procedure, a heart tissue marker clip was deployed into the biopsy cavity. Follow-up 2-view mammogram was performed and dictated separately.  IMPRESSION: Ultrasound-guided biopsy of an indeterminate left breast mass at 9 o'clock. No apparent complications.   Electronically Signed   By: Pamelia Hoit M.D.   On: 06/07/2014 11:36   [name] Rt Breast Bx W Loc Dev 1st Lesion Img Bx Spec US Guide  06/07/2014   CLINICAL DATA:  43 year old female with suspicious right breast mass at 5 o'clock  EXAM: ULTRASOUND GUIDED RIGHT BREAST CORE NEEDLE BIOPSY  COMPARISON:  Previous exams.  PROCEDURE: I met with the patient and we discussed the procedure of ultrasound-guided biopsy, including benefits and alternatives. We discussed the high likelihood of a successful procedure. We discussed the risks of the procedure including infection, bleeding, tissue injury, clip migration, and inadequate sampling. Informed written consent was given. The usual time-out protocol was performed immediately prior to the procedure.  Using sterile technique and 2% Lidocaine as local anesthetic, under direct ultrasound visualization, a 12 gauge vacuum-assisteddevice was used to perform biopsy of a suspicious right breast mass using a medial approach. At the conclusion of the procedure, a coil tissue marker clip was deployed into the biopsy cavity. Follow-up 2-view mammogram was performed and dictated separately.  IMPRESSION: Ultrasound-guided biopsy of a suspicious right breast mass at 5 o'clock. No apparent complications.    Electronically Signed   By: Pamelia Hoit M.D.   On: 06/07/2014 11:36      IMPRESSION: Clinical stage I invasive ductal carcinoma of the right breast (T1 B., N0, Mx).  The patient would be a good candidate for breast conservation with lumpectomy and radiation therapy directed at the right breast. I discussed the treatment course  side effects and potential toxicities of radiation therapy in this situation.  The patient appears interested in breast conservation therapy but also is considering mastectomy which 2 of her family members have undergone in the past for management of breast cancer which she describes as half-sisters. Patient does have a questionable lesion in the upper abdomen and will need further evaluation of this issue prior to proceeding with management of what appears to be stage I right breast cancer. The patient would also likely benefit from genetics evaluation in light of her family history and young age at diagnosis  PLAN: PET CT, genetics.    ------------------------------------------------  Blair Promise, MD

## 2014-06-22 NOTE — Progress Notes (Signed)
Note created during office visit by Dr. Gudena. Copy to patient, original to scan. 

## 2014-06-22 NOTE — Assessment & Plan Note (Signed)
Right breast invasive ductal carcinoma 9 mm by ultrasound T1 B. N0 M0 stage IA clinical stage ER/PR positive HER-2 negative Ki-67 33%, grade 3 with DCIS, strong family history of breast cancer  Pathology counseling:Discussed with the patient, the details of pathology including the type of breast cancer,the clinical staging, the significance of ER, PR and HER-2/neu receptors and the implications for treatment. After reviewing the pathology in detail, we proceeded to discuss the different treatment options between surgery, radiation, chemotherapy, antiestrogen therapies.  Recommendation: Breast conserving surgery lumpectomy followed by evaluation for Oncotype DX and determine if she needs chemotherapy. If she does not need chemotherapy procedure radiation followed by antiestrogen therapy.  Oncotype DX counseling I explained to the patient that this is a 17 gene panel to evaluate patient tumors DNA and determine using a recurrence score, regarding prognosis and prediction for chemotherapy benefit. She understands that if her tumor was found to be high risk, she would benefit from systemic chemotherapy. If she was low risk, there would not be any need or benefit from chemotherapy. If she was found to be intermediate risk, we would need to evaluate the score as well as other risk factors and determine if an abbreviated chemotherapy may be of benefit.  Diaphragmatic mass: PET/CT will be obtained. If PET is negative we will watch this and monitor it. If it is positive, we will need to set her up for interventional radiology guided biopsy versus laparoscopic biopsy.

## 2014-06-23 ENCOUNTER — Other Ambulatory Visit (INDEPENDENT_AMBULATORY_CARE_PROVIDER_SITE_OTHER): Payer: Self-pay | Admitting: Surgery

## 2014-06-23 ENCOUNTER — Ambulatory Visit (HOSPITAL_BASED_OUTPATIENT_CLINIC_OR_DEPARTMENT_OTHER): Payer: 59

## 2014-06-23 ENCOUNTER — Ambulatory Visit (INDEPENDENT_AMBULATORY_CARE_PROVIDER_SITE_OTHER): Payer: Self-pay | Admitting: Surgery

## 2014-06-23 ENCOUNTER — Other Ambulatory Visit: Payer: Self-pay | Admitting: *Deleted

## 2014-06-23 VITALS — BP 122/69 | HR 90 | Temp 98.9°F | Resp 18

## 2014-06-23 DIAGNOSIS — C50311 Malignant neoplasm of lower-inner quadrant of right female breast: Secondary | ICD-10-CM

## 2014-06-23 DIAGNOSIS — Z5111 Encounter for antineoplastic chemotherapy: Secondary | ICD-10-CM

## 2014-06-23 MED ORDER — GOSERELIN ACETATE 3.6 MG ~~LOC~~ IMPL
3.6000 mg | DRUG_IMPLANT | Freq: Once | SUBCUTANEOUS | Status: AC
  Administered 2014-06-23: 3.6 mg via SUBCUTANEOUS
  Filled 2014-06-23: qty 3.6

## 2014-06-23 NOTE — Patient Instructions (Signed)
Goserelin injection What is this medicine? GOSERELIN (GOE se rel in) is similar to a hormone found in the body. It lowers the amount of sex hormones that the body makes. Men will have lower testosterone levels and women will have lower estrogen levels while taking this medicine. In men, this medicine is used to treat prostate cancer; the injection is either given once per month or once every 12 weeks. A once per month injection (only) is used to treat women with endometriosis, dysfunctional uterine bleeding, or advanced breast cancer. This medicine may be used for other purposes; ask your health care provider or pharmacist if you have questions. COMMON BRAND NAME(S): Zoladex What should I tell my health care provider before I take this medicine? They need to know if you have any of these conditions (some only apply to women): -diabetes -heart disease or previous heart attack -high blood pressure -high cholesterol -kidney disease -osteoporosis or low bone density -problems passing urine -spinal cord injury -stroke -tobacco smoker -an unusual or allergic reaction to goserelin, hormone therapy, other medicines, foods, dyes, or preservatives -pregnant or trying to get pregnant -breast-feeding How should I use this medicine? This medicine is for injection under the skin. It is given by a health care professional in a hospital or clinic setting. Men receive this injection once every 4 weeks or once every 12 weeks. Women will only receive the once every 4 weeks injection. Talk to your pediatrician regarding the use of this medicine in children. Special care may be needed. Overdosage: If you think you have taken too much of this medicine contact a poison control center or emergency room at once. NOTE: This medicine is only for you. Do not share this medicine with others. What if I miss a dose? It is important not to miss your dose. Call your doctor or health care professional if you are unable to  keep an appointment. What may interact with this medicine? -female hormones like estrogen -herbal or dietary supplements like black cohosh, chasteberry, or DHEA -female hormones like testosterone -prasterone This list may not describe all possible interactions. Give your health care provider a list of all the medicines, herbs, non-prescription drugs, or dietary supplements you use. Also tell them if you smoke, drink alcohol, or use illegal drugs. Some items may interact with your medicine. What should I watch for while using this medicine? Visit your doctor or health care professional for regular checks on your progress. Your symptoms may appear to get worse during the first weeks of this therapy. Tell your doctor or healthcare professional if your symptoms do not start to get better or if they get worse after this time. Your bones may get weaker if you take this medicine for a long time. If you smoke or frequently drink alcohol you may increase your risk of bone loss. A family history of osteoporosis, chronic use of drugs for seizures (convulsions), or corticosteroids can also increase your risk of bone loss. Talk to your doctor about how to keep your bones strong. This medicine should stop regular monthly menstration in women. Tell your doctor if you continue to menstrate. Women should not become pregnant while taking this medicine or for 12 weeks after stopping this medicine. Women should inform their doctor if they wish to become pregnant or think they might be pregnant. There is a potential for serious side effects to an unborn child. Talk to your health care professional or pharmacist for more information. Do not breast-feed an infant while taking   this medicine. Men should inform their doctors if they wish to father a child. This medicine may lower sperm counts. Talk to your health care professional or pharmacist for more information. What side effects may I notice from receiving this  medicine? Side effects that you should report to your doctor or health care professional as soon as possible: -allergic reactions like skin rash, itching or hives, swelling of the face, lips, or tongue -bone pain -breathing problems -changes in vision -chest pain -feeling faint or lightheaded, falls -fever, chills -pain, swelling, warmth in the leg -pain, tingling, numbness in the hands or feet -signs and symptoms of low blood pressure like dizziness; feeling faint or lightheaded, falls; unusually weak or tired -stomach pain -swelling of the ankles, feet, hands -trouble passing urine or change in the amount of urine -unusually high or low blood pressure -unusually weak or tired Side effects that usually do not require medical attention (report to your doctor or health care professional if they continue or are bothersome): -change in sex drive or performance -changes in breast size in both males and females -changes in emotions or moods -headache -hot flashes -irritation at site where injected -loss of appetite -skin problems like acne, dry skin -vaginal dryness This list may not describe all possible side effects. Call your doctor for medical advice about side effects. You may report side effects to FDA at 1-800-FDA-1088. Where should I keep my medicine? This drug is given in a hospital or clinic and will not be stored at home. NOTE: This sheet is a summary. It may not cover all possible information. If you have questions about this medicine, talk to your doctor, pharmacist, or health care provider.  2015, Elsevier/Gold Standard. (2013-10-19 11:10:35)  

## 2014-06-24 ENCOUNTER — Other Ambulatory Visit (INDEPENDENT_AMBULATORY_CARE_PROVIDER_SITE_OTHER): Payer: Self-pay | Admitting: Surgery

## 2014-06-24 ENCOUNTER — Ambulatory Visit (HOSPITAL_COMMUNITY): Payer: 59

## 2014-06-24 DIAGNOSIS — D0511 Intraductal carcinoma in situ of right breast: Secondary | ICD-10-CM

## 2014-06-26 ENCOUNTER — Ambulatory Visit (INDEPENDENT_AMBULATORY_CARE_PROVIDER_SITE_OTHER): Payer: Self-pay | Admitting: Surgery

## 2014-06-26 DIAGNOSIS — C50911 Malignant neoplasm of unspecified site of right female breast: Secondary | ICD-10-CM

## 2014-06-27 ENCOUNTER — Other Ambulatory Visit: Payer: Self-pay | Admitting: *Deleted

## 2014-06-27 DIAGNOSIS — C50311 Malignant neoplasm of lower-inner quadrant of right female breast: Secondary | ICD-10-CM

## 2014-06-27 NOTE — Progress Notes (Signed)
Spoke to pt concerning [place] from 06/22/14. Pt relate her insurance is not paying for PET scan. Informed pt that we would get a CT CAP/bone scan per Dr. Lindi Adie. Pt has also requested to have right mastectomy with immediate reconstruction. Informed md team. Told pt that as soon as surgery date was scheduled I would then schedule f/u appt with Dr. Lindi Adie 1 wk after surgery. Pt denies further questions. No concerns related to dx or treatment care plan. Encourage pt to call with needs. Received verbal understanding.

## 2014-06-28 ENCOUNTER — Telehealth: Payer: Self-pay | Admitting: *Deleted

## 2014-06-28 ENCOUNTER — Encounter (HOSPITAL_COMMUNITY): Payer: 59

## 2014-06-28 NOTE — Telephone Encounter (Signed)
Spoke to pt concerning [place] from 10/28. Pt denies questions or concerns regarding dx. Pt would like to change surgery to right mastectomy with immediate reconstruction. Also insurance will not pay for PET. CT CAP and bone scan ordered per Dr Lindi Adie. Discussed with pt that surgery will need to be r/s to do combo case. Referral made to Dr. Iran Planas for plastic referral and appt will be made with Dr Lucia Gaskins to discuss mastectomy. Confirmed future appts. Pt denies needs at this time. Encourage pt to call with concerns. Contact information given.

## 2014-06-29 ENCOUNTER — Encounter: Payer: Self-pay | Admitting: Genetic Counselor

## 2014-06-29 ENCOUNTER — Ambulatory Visit (HOSPITAL_BASED_OUTPATIENT_CLINIC_OR_DEPARTMENT_OTHER): Payer: 59 | Admitting: Genetic Counselor

## 2014-06-29 ENCOUNTER — Other Ambulatory Visit: Payer: 59

## 2014-06-29 DIAGNOSIS — C50311 Malignant neoplasm of lower-inner quadrant of right female breast: Secondary | ICD-10-CM

## 2014-06-29 DIAGNOSIS — Z315 Encounter for genetic counseling: Secondary | ICD-10-CM

## 2014-06-29 DIAGNOSIS — Z803 Family history of malignant neoplasm of breast: Secondary | ICD-10-CM

## 2014-06-29 NOTE — Progress Notes (Signed)
Dr.  Nicholas Lose requested a consultation for Lydia counseling and risk assessment for Lydia Stevens, a 43 y.o. female, for discussion of her personal and family history of breast cancer.  She presents to clinic today to discuss the possibility of a Lydia predisposition to cancer, and to further clarify her risks, as well as her family members' risks for cancer.   HISTORY OF PRESENT ILLNESS: In 2015, at the age of 34, Lydia Stevens was diagnosed with invasive dutacl carcinoma of the right breast.  The tumor is ER+/PR+/Her2-. This was treated with unilateral mastectomy, and depending on the oncotypeDX, possible chemotherapy and radiation.  Lydia Stevens has never had a colonoscopy.  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    Past Medical History  Diagnosis Date  . Scoliosis   . Breast cancer     History reviewed. No pertinent past surgical history.  History   Social History  . Marital Status: Single    Spouse Name: N/A    Number of Children: 1  . Years of Education: N/A   Social History Main Topics  . Smoking status: Never Smoker   . Smokeless tobacco: None  . Alcohol Use: No  . Drug Use: No  . Sexual Activity: None   Other Topics Concern  . None   Social History Narrative    REPRODUCTIVE HISTORY AND PERSONAL RISK ASSESSMENT FACTORS: Menarche was at age 28.   premenopausal Uterus Intact: yes Ovaries Intact: yes G1P1A0, first live birth at age 73  She has not previously undergone treatment for infertility.   Oral Contraceptive use: 19 years of depo provera   She has not used HRT in the past.    FAMILY HISTORY:  We obtained a detailed, 4-generation family history.  Significant diagnoses are listed below: Family History  Problem Relation Age of Onset  . Breast cancer Paternal Aunt 86  . Heart attack Father   . Breast cancer Maternal Aunt 36  . Breast cancer Sister 37  . Breast cancer Maternal Aunt 54  The  patient reports that her sister had a partial hysterectomy as a result of fibroids at age 61 and a skin tag on her neck that is "much larger than a skin tag" that was removed when she was little but has grown back.    Patient's maternal ancestors are of [place] and [place] American descent, and paternal ancestors are of African American descent. There is no reported Ashkenazi Jewish ancestry. There is no known consanguinity.  Lydia Stevens is a 43 y.o. female with a personal and family history of breast cancer which somewhat suggestive of a hereditary cancer syndrome and predisposition to cancer. We, therefore, discussed and recommended the following at today's visit.   DISCUSSION: We reviewed the characteristics, features and inheritance patterns of hereditary cancer syndromes. We also discussed Lydia testing, including the appropriate family members to test, the process of testing, insurance coverage and turn-around-time for results. We reviewed specific cancer syndromes based on her family hisotry of cancer including BRCA and PTEN mutations.  We also discussed that there could be other genes involved with an increased risk for cancer.  We discussed targeted testing of these three genes, as well as larger panel testing.  Lydia Stevens wanted to start with the three genes, and have the option of adding on others if these are negative.  To do this we will use [employer]'s custom panel option.  PLAN: After considering the risks, benefits, and limitations, Lydia Stevens provided informed consent to pursue Lydia testing and the blood sample will be sent to [facility] for analysis of the BRCA1/BRCA2 and PTEN genes. We discussed the implications of a positive, negative and/ or variant of uncertain significance Lydia test result. Results should be available within approximately 2-3 weeks' time, at which point they will be disclosed by telephone to  Lydia Stevens, as will any additional recommendations warranted by these results. Lydia Stevens will receive a summary of her Lydia counseling visit and a copy of her results once available. This information will also be available in Epic. We encouraged Lydia Stevens to remain in contact with cancer genetics annually so that we can continuously update the family history and inform her of any changes in cancer genetics and testing that may be of benefit for her family. Lydia Stevens's questions were answered to her satisfaction today. Our contact information was provided should additional questions or concerns arise.  The patient was seen for a total of 60 minutes, greater than 50% of which was spent face-to-face counseling.  This note will also be sent to the referring provider via the electronic medical record. The patient will be supplied with a summary of this Lydia counseling discussion as well as educational information on the discussed hereditary cancer syndromes following the conclusion of their visit.    _______________________________________________________________________ For Office Staff:  Number of people involved in session: 1 Was an Intern/ student involved with case: no

## 2014-07-04 ENCOUNTER — Encounter: Payer: Self-pay | Admitting: Hematology and Oncology

## 2014-07-04 ENCOUNTER — Telehealth: Payer: Self-pay

## 2014-07-04 NOTE — Progress Notes (Signed)
Put fmla form on nurse's desk °

## 2014-07-04 NOTE — Telephone Encounter (Signed)
Let pt know the insurance company denied the PET scan.  Need to cancel the PET for tomorrow.  Dr Lindi Adie will review CT scan results to see if the PET can be re-ordered.  Pt voiced understanding.

## 2014-07-05 ENCOUNTER — Encounter (HOSPITAL_COMMUNITY): Payer: 59

## 2014-07-05 ENCOUNTER — Encounter: Payer: Self-pay | Admitting: Hematology and Oncology

## 2014-07-05 NOTE — Progress Notes (Signed)
Faxed fmla form to [employer] @ [phone] °

## 2014-07-06 ENCOUNTER — Encounter (HOSPITAL_COMMUNITY): Payer: Self-pay

## 2014-07-06 ENCOUNTER — Encounter (HOSPITAL_COMMUNITY)
Admission: RE | Admit: 2014-07-06 | Discharge: 2014-07-06 | Disposition: A | Discharge status: Home / Self Care | Payer: 59 | Source: Ambulatory Visit | Attending: Radiology | Admitting: Radiology

## 2014-07-06 ENCOUNTER — Ambulatory Visit (HOSPITAL_COMMUNITY)
Admission: RE | Admit: 2014-07-06 | Discharge: 2014-07-06 | Disposition: A | Discharge status: Home / Self Care | Payer: 59 | Source: Ambulatory Visit | Attending: Diagnostic Radiology | Admitting: Diagnostic Radiology

## 2014-07-06 ENCOUNTER — Telehealth: Payer: Self-pay | Admitting: Hematology and Oncology

## 2014-07-06 ENCOUNTER — Other Ambulatory Visit: Payer: Self-pay | Admitting: Hematology and Oncology

## 2014-07-06 ENCOUNTER — Encounter (HOSPITAL_COMMUNITY)
Admission: RE | Admit: 2014-07-06 | Discharge: 2014-07-06 | Disposition: A | Discharge status: Home / Self Care | Payer: 59 | Source: Ambulatory Visit | Attending: Hematology and Oncology | Admitting: Hematology and Oncology

## 2014-07-06 DIAGNOSIS — C50311 Malignant neoplasm of lower-inner quadrant of right female breast: Secondary | ICD-10-CM | POA: Insufficient documentation

## 2014-07-06 DIAGNOSIS — N2889 Other specified disorders of kidney and ureter: Secondary | ICD-10-CM | POA: Insufficient documentation

## 2014-07-06 DIAGNOSIS — R188 Other ascites: Secondary | ICD-10-CM | POA: Diagnosis not present

## 2014-07-06 DIAGNOSIS — R1902 Left upper quadrant abdominal swelling, mass and lump: Secondary | ICD-10-CM | POA: Diagnosis not present

## 2014-07-06 MED ORDER — IOHEXOL 300 MG/ML  SOLN
25.0000 mL | Freq: Once | INTRAMUSCULAR | Status: AC | PRN
  Administered 2014-07-06: 25 mL via ORAL

## 2014-07-06 MED ORDER — IOHEXOL 300 MG/ML  SOLN
100.0000 mL | Freq: Once | INTRAMUSCULAR | Status: AC | PRN
  Administered 2014-07-06: 100 mL via INTRAVENOUS

## 2014-07-06 MED ORDER — TECHNETIUM TC 99M MEDRONATE IV KIT
26.4000 | PACK | Freq: Once | INTRAVENOUS | Status: AC | PRN
  Administered 2014-07-06: 26.4 via INTRAVENOUS

## 2014-07-06 NOTE — Telephone Encounter (Signed)
[facility] will contact pt re ct - no other orders per 11/11 pof

## 2014-07-08 ENCOUNTER — Encounter (HOSPITAL_COMMUNITY): Payer: Self-pay

## 2014-07-08 ENCOUNTER — Ambulatory Visit (HOSPITAL_COMMUNITY)
Admission: RE | Admit: 2014-07-08 | Discharge: 2014-07-08 | Disposition: A | Discharge status: Home / Self Care | Payer: 59 | Source: Ambulatory Visit | Attending: Hematology and Oncology | Admitting: Hematology and Oncology

## 2014-07-08 ENCOUNTER — Other Ambulatory Visit: Payer: Self-pay | Admitting: Hematology and Oncology

## 2014-07-08 DIAGNOSIS — C50311 Malignant neoplasm of lower-inner quadrant of right female breast: Secondary | ICD-10-CM | POA: Diagnosis not present

## 2014-07-08 DIAGNOSIS — G939 Disorder of brain, unspecified: Secondary | ICD-10-CM | POA: Diagnosis not present

## 2014-07-08 DIAGNOSIS — R948 Abnormal results of function studies of other organs and systems: Secondary | ICD-10-CM | POA: Diagnosis present

## 2014-07-08 DIAGNOSIS — M278 Other specified diseases of jaws: Secondary | ICD-10-CM | POA: Insufficient documentation

## 2014-07-08 MED ORDER — IOHEXOL 300 MG/ML  SOLN: 100.0000 mL | Freq: Once | INTRAMUSCULAR | Status: AC | PRN

## 2014-07-11 ENCOUNTER — Other Ambulatory Visit (HOSPITAL_COMMUNITY): Payer: 59

## 2014-07-11 ENCOUNTER — Telehealth: Payer: Self-pay

## 2014-07-11 ENCOUNTER — Other Ambulatory Visit: Payer: Self-pay | Admitting: Hematology and Oncology

## 2014-07-11 ENCOUNTER — Ambulatory Visit (HOSPITAL_BASED_OUTPATIENT_CLINIC_OR_DEPARTMENT_OTHER): Admission: RE | Admit: 2014-07-11 | Payer: 59 | Source: Ambulatory Visit | Admitting: Surgery

## 2014-07-11 ENCOUNTER — Telehealth: Payer: Self-pay | Admitting: Hematology and Oncology

## 2014-07-11 ENCOUNTER — Encounter (HOSPITAL_BASED_OUTPATIENT_CLINIC_OR_DEPARTMENT_OTHER): Admission: RE | Payer: Self-pay | Source: Ambulatory Visit

## 2014-07-11 DIAGNOSIS — C50311 Malignant neoplasm of lower-inner quadrant of right female breast: Secondary | ICD-10-CM

## 2014-07-11 SURGERY — RADIOACTIVE SEED GUIDED PARTIAL MASTECTOMY WITH AXILLARY SENTINEL LYMPH NODE BIOPSY
Anesthesia: General | Laterality: Right

## 2014-07-11 NOTE — Telephone Encounter (Signed)
s.w pt and advised on NOV and DEC appt....pt did not know anything about the neurosurgery referral..i transferred her to the nurse

## 2014-07-11 NOTE — Telephone Encounter (Signed)
LMOVM - pt to return call to clinic.   

## 2014-07-14 ENCOUNTER — Ambulatory Visit (INDEPENDENT_AMBULATORY_CARE_PROVIDER_SITE_OTHER): Payer: Self-pay | Admitting: Surgery

## 2014-07-14 ENCOUNTER — Telehealth: Payer: Self-pay | Admitting: Genetic Counselor

## 2014-07-14 DIAGNOSIS — C50911 Malignant neoplasm of unspecified site of right female breast: Secondary | ICD-10-CM

## 2014-07-14 NOTE — Telephone Encounter (Signed)
Revealed negative genetic testing for BRCA1, BRCA2, and PTEN.

## 2014-07-14 NOTE — Progress Notes (Signed)
Faxed FMLA form to [employer].  Sent to scan.

## 2014-07-15 ENCOUNTER — Encounter: Payer: Self-pay | Admitting: Genetic Counselor

## 2014-07-15 DIAGNOSIS — Z1379 Encounter for other screening for genetic and chromosomal anomalies: Secondary | ICD-10-CM | POA: Insufficient documentation

## 2014-07-15 DIAGNOSIS — Z0000 Encounter for general adult medical examination without abnormal findings: Secondary | ICD-10-CM | POA: Insufficient documentation

## 2014-07-15 NOTE — Progress Notes (Signed)
HPI: Ms.. Lydia Stevens was previously seen in the [facility] due to a personal history of cancer and concerns regarding a hereditary predisposition to cancer. Please refer to our prior cancer genetics clinic note for more information regarding Ms.. Lydia Stevens's medical, social and family histories, and our assessment and recommendations, at the time. Ms.. Lydia Stevens's recent genetic test results were disclosed to her, as were recommendations warranted by these results. These results and recommendations are discussed in more detail below.  GENETIC TEST RESULTS: At the time of Ms.. Lydia Stevens's visit, we recommended she pursue genetic testing. This test, which included sequencing and deletion/duplication analysis of the following genes:  BRCA1, BRCA2 and PTEN.  The report date is July 14, 2014.  Testing was performed at [facility]. Genetic testing was normal, and did not reveal a deleterious mutation in these genes. The test report has been scanned into EPIC and is located under the Media tab.   We discussed with Ms.Lydia Stevens that since the current genetic testing is not perfect, it is possible there may be a gene mutation in one of these genes that current testing cannot detect, but that chance is small. We also discussed, that it is possible that another gene that has not yet been discovered, or that we have not yet tested, is responsible for the cancer diagnoses in the family, and it is, therefore, important to remain in touch with cancer genetics in the future so that we can continue to offer Ms.Lydia Stevens the most up to date genetic testing.   CANCER SCREENING RECOMMENDATIONS: This result is reassuring and suggests that Ms.. Lydia Stevens's cancer was most likely not due to an inherited predisposition associated with one of these genes. Most cancers happen by chance and this negative test, along with details of her family history, suggests that her cancer  falls into this category. We, therefore, recommended she continue to follow the cancer management and screening guidelines provided by her oncology and primary providers.   RECOMMENDATIONS FOR FAMILY MEMBERS: Women in this family might be at some increased risk of developing cancer, over the general population risk, simply due to the family history of cancer. We recommended women in this family have a yearly mammogram beginning at age 56, an an annual clinical breast exam, and perform monthly breast self-exams. Women in this family should also have a gynecological exam as recommended by their primary provider. All family members should have a colonoscopy by age 52.  FOLLOW-UP: Lastly, we discussed with Ms.Lydia Stevens that cancer genetics is a rapidly advancing field and it is possible that new genetic tests will be appropriate for her and/or her family members in the future. We encouraged her to remain in contact with cancer genetics on an annual basis so we can update her personal and family histories and let her know of advances in cancer genetics that may benefit this family.   Our contact number was provided. Ms.. Lydia Stevens's questions were answered to her satisfaction, and she knows she is welcome to call us at anytime with additional questions or concerns.   Roma Kayser, MS, [facility] Certified Genetic Counselor [email or web address]

## 2014-07-19 ENCOUNTER — Telehealth: Payer: Self-pay | Admitting: Hematology and Oncology

## 2014-07-19 ENCOUNTER — Encounter: Payer: Self-pay | Admitting: *Deleted

## 2014-07-20 ENCOUNTER — Other Ambulatory Visit (HOSPITAL_COMMUNITY): Payer: 59

## 2014-07-20 NOTE — Pre-Procedure Instructions (Signed)
Lydia Stevens  07/20/2014   Your procedure is scheduled on:  Wednesday, December 2nd  Report to [facility] Admitting at 630 AM.  Call this number if you have problems the morning of surgery: [phone]   Remember:   Do not eat food or drink liquids after midnight.   Take these medicines the morning of surgery with A SIP OF WATER: none  Stop taking excedrin migraine as of today.   Do not wear jewelry, make-up or nail polish.  Do not wear lotions, powders, or perfume, deodorant.  Do not shave 48 hours prior to surgery. Men may shave face and neck.  Do not bring valuables to the hospital.  [facility] is not responsible  for any belongings or valuables.               Contacts, dentures or bridgework may not be worn into surgery.  Leave suitcase in the car. After surgery it may be brought to your room.  For patients admitted to the hospital, discharge time is determined by your  treatment team.       Please read over the following fact sheets that you were given: Pain Booklet, Coughing and Deep Breathing, MRSA Information and Surgical Site Infection Prevention  [facility] - Preparing for Surgery  Before surgery, you can play an important role.  Because skin is not sterile, your skin needs to be as free of germs as possible.  You can reduce the number of germs on you skin by washing with CHG (chlorahexidine gluconate) soap before surgery.  CHG is an antiseptic cleaner which kills germs and bonds with the skin to continue killing germs even after washing.  Please DO NOT use if you have an allergy to CHG or antibacterial soaps.  If your skin becomes reddened/irritated stop using the CHG and inform your nurse when you arrive at Short Stay.  Do not shave (including legs and underarms) for at least 48 hours prior to the first CHG shower.  You may shave your face.  Please follow these instructions carefully:   1.  Shower with CHG Soap the night before surgery and the  morning of Surgery.  2.  If you choose to wash your hair, wash your hair first as usual with your normal shampoo.  3.  After you shampoo, rinse your hair and body thoroughly to remove the shampoo.  4.  Use CHG as you would any other liquid soap.  You can apply CHG directly to the skin and wash gently with scrungie or a clean washcloth.  5.  Apply the CHG Soap to your body ONLY FROM THE NECK DOWN.  Do not use on open wounds or open sores.  Avoid contact with your eyes, ears, mouth and genitals (private parts).  Wash genitals (private parts) with your normal soap.  6.  Wash thoroughly, paying special attention to the area where your surgery will be performed.  7.  Thoroughly rinse your body with warm water from the neck down.  8.  DO NOT shower/wash with your normal soap after using and rinsing off the CHG Soap.  9.  Pat yourself dry with a clean towel.            10.  Wear clean pajamas.            11.  Place clean sheets on your bed the night of your first shower and do not sleep with pets.  Day of Surgery  Do not apply  any lotions/deoderants the morning of surgery.  Please wear clean clothes to the hospital/surgery center.

## 2014-07-22 ENCOUNTER — Encounter (HOSPITAL_COMMUNITY)
Admission: RE | Admit: 2014-07-22 | Discharge: 2014-07-22 | Disposition: A | Discharge status: Home / Self Care | Payer: 59 | Source: Ambulatory Visit | Attending: Surgery | Admitting: Surgery

## 2014-07-22 ENCOUNTER — Ambulatory Visit (HOSPITAL_BASED_OUTPATIENT_CLINIC_OR_DEPARTMENT_OTHER): Payer: 59

## 2014-07-22 ENCOUNTER — Ambulatory Visit: Payer: 59

## 2014-07-22 ENCOUNTER — Encounter (HOSPITAL_COMMUNITY): Payer: Self-pay

## 2014-07-22 DIAGNOSIS — Z01812 Encounter for preprocedural laboratory examination: Secondary | ICD-10-CM | POA: Insufficient documentation

## 2014-07-22 DIAGNOSIS — Z5111 Encounter for antineoplastic chemotherapy: Secondary | ICD-10-CM

## 2014-07-22 DIAGNOSIS — C50311 Malignant neoplasm of lower-inner quadrant of right female breast: Secondary | ICD-10-CM

## 2014-07-22 HISTORY — DX: Anemia, unspecified: D64.9

## 2014-07-22 LAB — BASIC METABOLIC PANEL
Anion gap: 14 (ref 5–15)
BUN: 10 mg/dL (ref 6–23)
CHLORIDE: 107 meq/L (ref 96–112)
CO2: 23 mEq/L (ref 19–32)
CREATININE: 0.85 mg/dL (ref 0.50–1.10)
Calcium: 9.1 mg/dL (ref 8.4–10.5)
GFR calc Af Amer: >90 mL/min (ref >90)
GFR, EST NON AFRICAN AMERICAN: 83 mL/min — AB (ref >90)
Glucose, Bld: 82 mg/dL (ref 70–99)
POTASSIUM: 3.6 meq/L — AB (ref 3.7–5.3)
Sodium: 144 mEq/L (ref 137–147)

## 2014-07-22 LAB — CBC
HCT: 36.8 % (ref 36.0–46.0)
HEMOGLOBIN: 12.3 g/dL (ref 12.0–15.0)
MCH: 28.7 pg (ref 26.0–34.0)
MCHC: 33.4 g/dL (ref 30.0–36.0)
MCV: 85.8 fL (ref 78.0–100.0)
PLATELETS: 273 10*3/uL (ref 150–400)
RBC: 4.29 MIL/uL (ref 3.87–5.11)
RDW: 15.1 % (ref 11.5–15.5)
WBC: 7.8 10*3/uL (ref 4.0–10.5)

## 2014-07-22 LAB — HCG, SERUM, QUALITATIVE: PREG SERUM: NEGATIVE

## 2014-07-22 MED ORDER — CHLORHEXIDINE GLUCONATE 4 % EX LIQD: 1.0000 "application " | Freq: Once | CUTANEOUS | Status: DC

## 2014-07-22 MED ORDER — GOSERELIN ACETATE 3.6 MG ~~LOC~~ IMPL
3.6000 mg | DRUG_IMPLANT | Freq: Once | SUBCUTANEOUS | Status: AC
  Administered 2014-07-22: 3.6 mg via SUBCUTANEOUS
  Filled 2014-07-22: qty 3.6

## 2014-07-22 NOTE — Patient Instructions (Signed)
Goserelin injection What is this medicine? GOSERELIN (GOE se rel in) is similar to a hormone found in the body. It lowers the amount of sex hormones that the body makes. Men will have lower testosterone levels and women will have lower estrogen levels while taking this medicine. In men, this medicine is used to treat prostate cancer; the injection is either given once per month or once every 12 weeks. A once per month injection (only) is used to treat women with endometriosis, dysfunctional uterine bleeding, or advanced breast cancer. This medicine may be used for other purposes; ask your health care provider or pharmacist if you have questions. COMMON BRAND NAME(S): Zoladex What should I tell my health care provider before I take this medicine? They need to know if you have any of these conditions (some only apply to women): -diabetes -heart disease or previous heart attack -high blood pressure -high cholesterol -kidney disease -osteoporosis or low bone density -problems passing urine -spinal cord injury -stroke -tobacco smoker -an unusual or allergic reaction to goserelin, hormone therapy, other medicines, foods, dyes, or preservatives -pregnant or trying to get pregnant -breast-feeding How should I use this medicine? This medicine is for injection under the skin. It is given by a health care professional in a hospital or clinic setting. Men receive this injection once every 4 weeks or once every 12 weeks. Women will only receive the once every 4 weeks injection. Talk to your pediatrician regarding the use of this medicine in children. Special care may be needed. Overdosage: If you think you have taken too much of this medicine contact a poison control center or emergency room at once. NOTE: This medicine is only for you. Do not share this medicine with others. What if I miss a dose? It is important not to miss your dose. Call your doctor or health care professional if you are unable to  keep an appointment. What may interact with this medicine? -female hormones like estrogen -herbal or dietary supplements like black cohosh, chasteberry, or DHEA -female hormones like testosterone -prasterone This list may not describe all possible interactions. Give your health care provider a list of all the medicines, herbs, non-prescription drugs, or dietary supplements you use. Also tell them if you smoke, drink alcohol, or use illegal drugs. Some items may interact with your medicine. What should I watch for while using this medicine? Visit your doctor or health care professional for regular checks on your progress. Your symptoms may appear to get worse during the first weeks of this therapy. Tell your doctor or healthcare professional if your symptoms do not start to get better or if they get worse after this time. Your bones may get weaker if you take this medicine for a long time. If you smoke or frequently drink alcohol you may increase your risk of bone loss. A family history of osteoporosis, chronic use of drugs for seizures (convulsions), or corticosteroids can also increase your risk of bone loss. Talk to your doctor about how to keep your bones strong. This medicine should stop regular monthly menstration in women. Tell your doctor if you continue to menstrate. Women should not become pregnant while taking this medicine or for 12 weeks after stopping this medicine. Women should inform their doctor if they wish to become pregnant or think they might be pregnant. There is a potential for serious side effects to an unborn child. Talk to your health care professional or pharmacist for more information. Do not breast-feed an infant while taking   this medicine. Men should inform their doctors if they wish to father a child. This medicine may lower sperm counts. Talk to your health care professional or pharmacist for more information. What side effects may I notice from receiving this  medicine? Side effects that you should report to your doctor or health care professional as soon as possible: -allergic reactions like skin rash, itching or hives, swelling of the face, lips, or tongue -bone pain -breathing problems -changes in vision -chest pain -feeling faint or lightheaded, falls -fever, chills -pain, swelling, warmth in the leg -pain, tingling, numbness in the hands or feet -signs and symptoms of low blood pressure like dizziness; feeling faint or lightheaded, falls; unusually weak or tired -stomach pain -swelling of the ankles, feet, hands -trouble passing urine or change in the amount of urine -unusually high or low blood pressure -unusually weak or tired Side effects that usually do not require medical attention (report to your doctor or health care professional if they continue or are bothersome): -change in sex drive or performance -changes in breast size in both males and females -changes in emotions or moods -headache -hot flashes -irritation at site where injected -loss of appetite -skin problems like acne, dry skin -vaginal dryness This list may not describe all possible side effects. Call your doctor for medical advice about side effects. You may report side effects to FDA at 1-800-FDA-1088. Where should I keep my medicine? This drug is given in a hospital or clinic and will not be stored at home. NOTE: This sheet is a summary. It may not cover all possible information. If you have questions about this medicine, talk to your doctor, pharmacist, or health care provider.  2015, Elsevier/Gold Standard. (2013-10-19 11:10:35)  

## 2014-07-26 MED ORDER — CEFAZOLIN SODIUM-DEXTROSE 2-3 GM-% IV SOLR
2.0000 g | INTRAVENOUS | Status: AC
  Administered 2014-07-27 (×2): 2 g via INTRAVENOUS
  Filled 2014-07-26: qty 50

## 2014-07-26 NOTE — H&P (Signed)
Re: Lydia Stevens DOB: [birth year] MRN: [id number]  [facility]  ASSESSMENT AND PLAN: 1. Right breast cancer - 5 o'clock Treating oncologist - Gudena/Kinard Biopsy - 06/07/2014 - ([id number]) - IDC, Grade 3, ER - 100%, PR - 71%, Her2Neu - negative, Ki67 - 33% I discussed the options for breast cancer treatment with the patient. She is in the multidisciplinary clinic and understands the treatment of breast cancer includes medical oncology and radiation oncology. I discussed the surgical options of lumpectomy vs. mastectomy. If mastectomy, there is the possibility of reconstruction. I discussed the options of lymph node biopsy. The treatment plan depends on the pathologic staging of the tumor and the patient's personal wishes. The risks of surgery include, but are not limited to, bleeding, infection, the need for further surgery, and nerve injury. The patient has been given literature on the treatment of breast cancer. Plan: 1.) PET/CT for abdominal mass (Note PET was denied by insurance company), 2) Genetics, 3) Right breast lumpectomy and right axillary SLNBx  Addendum Note: The patient has decided to go with a right mastectomy and immediate reconstruction. She has seen Dr. Iran Planas and wants a TRAM. She does not want a foreign body. Genetics per K. Powell on 07/15/2014 were negative.  Her abdominal mass is probably splenic tissue. But this lead to a CT scan of her head on 07/08/2014: 1. 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 2. 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 Follow-up MRI of the brain with and without Contrast recommended for further assessment.  2. LUQ subdiaphragmatic mass on MRI Plan PET/CT  3. Fibroadenoma - Lower  inner left breast 4. Scoliosis 5. History of anemia/blood transfusion when young, but no current problem  No chief complaint on file.  REFERRING PHYSICIAN: No PCP Per Patient  HISTORY OF PRESENT ILLNESS: Lydia Stevens is a 43 y.o. (DOB: [birth year]) AA female whose primary care physician is No PCP Per Patient and comes to the [facility] today for a new right breast cancer. She comes by herself. Her husband is at home resting - he works 3rd shift.  She has 2 half sisters who have had breast cancer. Both had mastectomies. She is unsure of their genetic tests.  Mammogram - 06/07/2014 - 1. Indeterminate right breast mass at 5 o'clock, 10 cm from the  nipple. 2. Benign cluster of cysts within the right axillary tail. 3. Indeterminate left breast mass at 9 o'clock, 10 cm from the nipple.  MRI - 06/17/2014 - 1. 10 x 7 x 5 mm biopsy-proven invasive ductal carcinoma and ductal carcinoma in situ in the posterior aspect of the lower inner quadrant of the right breast.  2. 7 x 7 x 7 mm biopsy-proven fibroadenoma in the posterior aspect of the lower inner quadrant of the left breast.  3. 3.0 x 1.1 cm oval soft tissue mass in the anterior aspect of the left upper quadrant of the abdomen, abutting the peritoneum. Differential considerations include unusual accessory splenic tissue and peritoneal malignancy. Further evaluation with an abdomen and pelvis CT with contrast or PET-CT is recommended. Biopsy 06/07/2014 showed right breast cancer.   Past Medical History  Diagnosis Date  . Scoliosis     No past surgical history on file.   Current Outpatient Prescriptions  Medication Sig Dispense Refill  . Acetaminophen-Aspirin Buffered (EXCEDRIN BACK & BODY) 250-250 MG  tablet Take 1 tablet by mouth every 4 (four) hours as needed. For pain     . cyclobenzaprine (FLEXERIL) 10 MG tablet Take 10 mg by mouth 3 (three) times daily as  needed. For muscle spasms     . naproxen (NAPROSYN) 250 MG tablet Take 250 mg by mouth 2 (two) times daily as needed. For pain and inflammation     . predniSONE (DELTASONE) 10 MG tablet Take 2 tablets (20 mg total) by mouth daily. 16 tablet 0   No current facility-administered medications for this visit.    No Known Allergies  REVIEW OF SYSTEMS: Skin: No history of rash. No history of abnormal moles. Infection: No history of hepatitis or HIV. No history of MRSA. Neurologic: No history of stroke. No history of seizure. No history of headaches. Cardiac: No history of hypertension. No history of heart disease.  Pulmonary: Does not smoke cigarettes. No asthma or bronchitis. No OSA/CPAP.  Endocrine: No diabetes. No thyroid disease. Gastrointestinal: No history of stomach disease. No history of liver disease. No history of gall bladder disease. No history of pancreas disease. No history of colon disease. Urologic: No history of kidney stones. No history of bladder infections. GYN: She gets Depo shots for birth control. She is up for one soon. I told her to talk to Dr. Lindi Adie before doing this. Musculoskeletal: Scoliosis. Has a TENS unit for her back. Hematologic: No bleeding disorder. No history of anemia. Not anticoagulated. Psycho-social: The patient is oriented. The patient has no obvious psychologic or social impairment to understanding our conversation and plan.  SOCIAL and FAMILY HISTORY: Married. Husband works 3rd shift and he stayed at home. Works as Librarian, academic for Therapist, art at [employer]. Has 40 yo son   PHYSICAL EXAM: There were no vitals taken for this visit.  General: AA F who is alert and generally healthy appearing. She is soft spoken. HEENT: Normal. Pupils equal. Neck: Supple. No mass. No thyroid mass. Lymph Nodes: No supraclavicular or cervical or axillary nodes. Lungs: Clear to auscultation and  symmetric breath sounds. Heart: RRR. No murmur or rub. Breast: Right - bruise at 5 o'clock (maybe some small mass effect) Left - bruise at 9 o'clock (maybe some small mass effect)  Abdomen: Soft. No mass. No tenderness. No hernia. Normal bowel sounds. No abdominal scars. Rectal: Not done. Extremities: Good strength and ROM in upper and lower extremities. Neurologic: Grossly intact to motor and sensory function. Psychiatric: Has normal mood and affect. Behavior is normal.   DATA REVIEWED: Epic notes  Alphonsa Overall, MD, [facility], [place] [place], [place]

## 2014-07-26 NOTE — H&P (Signed)
  Subjective:    Patient ID: Lydia Stevens is a 43 y.o. female.  HPI  Here for follow up discussion with husband regarding breast reconstruction. She presented following screening MMG that revealed a 9 mm abnormality in the right breast. Bx demonstrated invasive ductal carcinoma with DCIS. The left breast just 7 mm fibroadenoma. Patient that there was a left upper quadrant abdominal diaphragmatic mass measuring 3 x 1.1 cm in size, unclear significance. Mass 9 mm by ultrasound. ER/PR positive HER-2 negative. Her genetics testing is negative.   She was initially unable to get PET scan due to insurance. CT C/A/P revealed corresponding to MRI mass Isolated left upper quadrant "Mass" which is indeterminate. Given its density similarity to the spleen, splenule is suspected. Atypical isolated appearance of omental/peritoneal metastasis felt less likely. Consider confirmation of splenule with nuclear medicine damaged right blood cell or liver/ spleen MAA scan. Follow up NM scan revealed uptake in mandible. 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 2. 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 Follow-up MRI of the brain with and without Contrast recommended for further assessment." Per chart review has been referred to Neurosurgery.  Current DD bra, wt stable. Desires smaller size.   Review of Systems  All other systems reviewed and are negative.  11 point system review negative.    Objective:    Physical Exam  Cardiovascular: Normal rate. Normal heart sounds Pulmonary/Chest: Effort normal. Clear to auscultation Abdominal: Soft.  moderate redundant soft tissue without panniculus  GU no palpable masses Grade I ptosis bilat, L > R volume SN to nipple R 29.5 L 29 cm BW R 20 L 19 cm Nipple to IMF R 11 L 13 cm      Assessment:      Right breast lower inner quadrant cancer     Plan:      Reviewed that reconstructed breast will be significantly smaller than present with any method. Patient desires to avoid implants and plan for pedicled TRAM flap. Counseled with TRAM reconstruction concerned volume of tissus available will not be sufficient and may desire implant to supplement in future. With TRAM ,discussed 3-4 day hospital stay, multiple drains, risk abdominal bulge, hernia, scar from hip to hip. Additional risks including but not limited to bleeding, seroma, hematoma, DVT/PE, cardiopulmonary complications, fat necrosis, need for additional surgery, damage to deeper structures, failure of flap. Reviewed staged nature of reconstruction, multiple procedures. Reviewed timing of secondary surgeries will depend on any adjuvant treatments. Reviewed symmetry procedures in future namely reduction opposite breast. Counseled on post op positioning, walking bent over. Reviewed bowel regimen and encouraged to take stool softener/laxative today as she has not had BM. Reviewed bra, no underwire post op and abdominal binder.   Irene Limbo, MD [facility] [phone]

## 2014-07-27 ENCOUNTER — Inpatient Hospital Stay (HOSPITAL_COMMUNITY)
Admission: RE | Admit: 2014-07-27 | Discharge: 2014-07-30 | DRG: 581 | Disposition: A | Discharge status: Home / Self Care | Payer: 59 | Source: Ambulatory Visit | Attending: Plastic Surgery | Admitting: Plastic Surgery

## 2014-07-27 ENCOUNTER — Encounter (HOSPITAL_COMMUNITY)
Admission: RE | Disposition: A | Discharge status: Home / Self Care | Payer: Self-pay | Source: Ambulatory Visit | Attending: Plastic Surgery

## 2014-07-27 ENCOUNTER — Ambulatory Visit (HOSPITAL_COMMUNITY): Payer: 59 | Admitting: Anesthesiology

## 2014-07-27 ENCOUNTER — Ambulatory Visit (HOSPITAL_COMMUNITY)
Admission: RE | Admit: 2014-07-27 | Discharge: 2014-07-27 | Disposition: A | Discharge status: Home / Self Care | Payer: 59 | Source: Ambulatory Visit | Attending: Surgery | Admitting: Surgery

## 2014-07-27 ENCOUNTER — Encounter (HOSPITAL_COMMUNITY): Payer: Self-pay | Admitting: *Deleted

## 2014-07-27 DIAGNOSIS — Z170 Estrogen receptor positive status [ER+]: Secondary | ICD-10-CM | POA: Diagnosis not present

## 2014-07-27 DIAGNOSIS — C50919 Malignant neoplasm of unspecified site of unspecified female breast: Secondary | ICD-10-CM | POA: Diagnosis present

## 2014-07-27 DIAGNOSIS — C50311 Malignant neoplasm of lower-inner quadrant of right female breast: Principal | ICD-10-CM | POA: Diagnosis present

## 2014-07-27 DIAGNOSIS — C50911 Malignant neoplasm of unspecified site of right female breast: Secondary | ICD-10-CM

## 2014-07-27 DIAGNOSIS — M419 Scoliosis, unspecified: Secondary | ICD-10-CM | POA: Diagnosis present

## 2014-07-27 DIAGNOSIS — Z7952 Long term (current) use of systemic steroids: Secondary | ICD-10-CM

## 2014-07-27 DIAGNOSIS — D242 Benign neoplasm of left breast: Secondary | ICD-10-CM | POA: Diagnosis present

## 2014-07-27 HISTORY — PX: LATISSIMUS FLAP TO BREAST: SHX5357

## 2014-07-27 HISTORY — PX: MASTECTOMY W/ SENTINEL NODE BIOPSY: SHX2001

## 2014-07-27 SURGERY — MASTECTOMY WITH SENTINEL LYMPH NODE BIOPSY
Anesthesia: General | Site: Breast | Laterality: Right

## 2014-07-27 MED ORDER — DEXAMETHASONE SODIUM PHOSPHATE 4 MG/ML IJ SOLN
INTRAMUSCULAR | Status: AC
  Filled 2014-07-27: qty 2

## 2014-07-27 MED ORDER — FENTANYL CITRATE 0.05 MG/ML IJ SOLN
INTRAMUSCULAR | Status: DC | PRN
  Administered 2014-07-27 (×3): 50 ug via INTRAVENOUS
  Administered 2014-07-27: 25 ug via INTRAVENOUS
  Administered 2014-07-27: 100 ug via INTRAVENOUS
  Administered 2014-07-27 (×4): 50 ug via INTRAVENOUS

## 2014-07-27 MED ORDER — GLYCOPYRROLATE 0.2 MG/ML IJ SOLN
INTRAMUSCULAR | Status: AC
  Filled 2014-07-27: qty 3

## 2014-07-27 MED ORDER — BUPIVACAINE HCL (PF) 0.25 % IJ SOLN
INTRAMUSCULAR | Status: AC
  Filled 2014-07-27: qty 30

## 2014-07-27 MED ORDER — GLYCOPYRROLATE 0.2 MG/ML IJ SOLN
INTRAMUSCULAR | Status: DC | PRN
  Administered 2014-07-27: 0.6 mg via INTRAVENOUS

## 2014-07-27 MED ORDER — HYDROMORPHONE 0.3 MG/ML IV SOLN
INTRAVENOUS | Status: AC
  Filled 2014-07-27: qty 25

## 2014-07-27 MED ORDER — ONDANSETRON HCL 4 MG/2ML IJ SOLN
INTRAMUSCULAR | Status: DC | PRN
  Administered 2014-07-27: 4 mg via INTRAVENOUS

## 2014-07-27 MED ORDER — SODIUM CHLORIDE 0.9 % IJ SOLN
INTRAMUSCULAR | Status: DC | PRN
  Administered 2014-07-27: 1.5 mL

## 2014-07-27 MED ORDER — TECHNETIUM TC 99M SULFUR COLLOID FILTERED
1.0000 | Freq: Once | INTRAVENOUS | Status: AC | PRN
  Administered 2014-07-27: 1 via INTRADERMAL

## 2014-07-27 MED ORDER — LIDOCAINE HCL (CARDIAC) 20 MG/ML IV SOLN
INTRAVENOUS | Status: DC | PRN
  Administered 2014-07-27: 100 mg via INTRAVENOUS

## 2014-07-27 MED ORDER — MEPERIDINE HCL 25 MG/ML IJ SOLN: 6.2500 mg | INTRAMUSCULAR | Status: DC | PRN

## 2014-07-27 MED ORDER — FENTANYL CITRATE 0.05 MG/ML IJ SOLN
INTRAMUSCULAR | Status: AC
  Filled 2014-07-27: qty 5

## 2014-07-27 MED ORDER — DEXTROSE 5 % IV SOLN
INTRAVENOUS | Status: DC | PRN
  Administered 2014-07-27 (×2): via INTRAVENOUS

## 2014-07-27 MED ORDER — NALOXONE HCL 0.4 MG/ML IJ SOLN: 0.4000 mg | INTRAMUSCULAR | Status: DC | PRN

## 2014-07-27 MED ORDER — METHYLENE BLUE 1 % INJ SOLN
INTRAMUSCULAR | Status: AC
  Filled 2014-07-27: qty 10

## 2014-07-27 MED ORDER — 0.9 % SODIUM CHLORIDE (POUR BTL) OPTIME
TOPICAL | Status: DC | PRN
  Administered 2014-07-27 (×3): 1000 mL

## 2014-07-27 MED ORDER — PROMETHAZINE HCL 25 MG/ML IJ SOLN
INTRAMUSCULAR | Status: AC
  Filled 2014-07-27: qty 1

## 2014-07-27 MED ORDER — FENTANYL CITRATE 0.05 MG/ML IJ SOLN
25.0000 ug | INTRAMUSCULAR | Status: DC | PRN
  Administered 2014-07-27: 25 ug via INTRAVENOUS

## 2014-07-27 MED ORDER — CHLORHEXIDINE GLUCONATE 4 % EX LIQD
1.0000 "application " | Freq: Once | CUTANEOUS | Status: DC
  Filled 2014-07-27: qty 15

## 2014-07-27 MED ORDER — CEFAZOLIN SODIUM 1-5 GM-% IV SOLN
1.0000 g | Freq: Three times a day (TID) | INTRAVENOUS | Status: AC
  Administered 2014-07-27 – 2014-07-29 (×5): 1 g via INTRAVENOUS
  Filled 2014-07-27 (×6): qty 50

## 2014-07-27 MED ORDER — DIPHENHYDRAMINE HCL 50 MG/ML IJ SOLN: 12.5000 mg | Freq: Four times a day (QID) | INTRAMUSCULAR | Status: DC | PRN

## 2014-07-27 MED ORDER — HEPARIN SODIUM (PORCINE) 5000 UNIT/ML IJ SOLN
INTRAMUSCULAR | Status: AC
  Administered 2014-07-27: 5000 [IU] via SUBCUTANEOUS
  Filled 2014-07-27: qty 1

## 2014-07-27 MED ORDER — PROPOFOL 10 MG/ML IV BOLUS
INTRAVENOUS | Status: DC | PRN
  Administered 2014-07-27: 150 mg via INTRAVENOUS

## 2014-07-27 MED ORDER — BUPIVACAINE-EPINEPHRINE (PF) 0.5% -1:200000 IJ SOLN
INTRAMUSCULAR | Status: DC | PRN
  Administered 2014-07-27: 25 mL via PERINEURAL

## 2014-07-27 MED ORDER — ROCURONIUM BROMIDE 50 MG/5ML IV SOLN
INTRAVENOUS | Status: AC
  Filled 2014-07-27: qty 2

## 2014-07-27 MED ORDER — SODIUM CHLORIDE 0.9 % IJ SOLN: 9.0000 mL | INTRAMUSCULAR | Status: DC | PRN

## 2014-07-27 MED ORDER — FAMOTIDINE IN NACL 20-0.9 MG/50ML-% IV SOLN
20.0000 mg | Freq: Two times a day (BID) | INTRAVENOUS | Status: DC
  Administered 2014-07-27 – 2014-07-29 (×4): 20 mg via INTRAVENOUS
  Filled 2014-07-27 (×6): qty 50

## 2014-07-27 MED ORDER — ONDANSETRON HCL 4 MG/2ML IJ SOLN
4.0000 mg | Freq: Four times a day (QID) | INTRAMUSCULAR | Status: DC | PRN
  Administered 2014-07-27: 4 mg via INTRAVENOUS
  Filled 2014-07-27: qty 2

## 2014-07-27 MED ORDER — ONDANSETRON HCL 4 MG/2ML IJ SOLN
INTRAMUSCULAR | Status: AC
  Filled 2014-07-27: qty 2

## 2014-07-27 MED ORDER — LACTATED RINGERS IV SOLN
INTRAVENOUS | Status: DC | PRN
  Administered 2014-07-27 (×4): via INTRAVENOUS

## 2014-07-27 MED ORDER — DIPHENHYDRAMINE HCL 12.5 MG/5ML PO ELIX: 12.5000 mg | ORAL_SOLUTION | Freq: Four times a day (QID) | ORAL | Status: DC | PRN

## 2014-07-27 MED ORDER — METHYLPREDNISOLONE SODIUM SUCC 125 MG IJ SOLR
80.0000 mg | Freq: Three times a day (TID) | INTRAMUSCULAR | Status: AC
  Administered 2014-07-27 – 2014-07-28 (×3): 80 mg via INTRAVENOUS
  Filled 2014-07-27: qty 1.28
  Filled 2014-07-27: qty 2
  Filled 2014-07-27 (×2): qty 1.28

## 2014-07-27 MED ORDER — FENTANYL CITRATE 0.05 MG/ML IJ SOLN
INTRAMUSCULAR | Status: AC
  Filled 2014-07-27: qty 2

## 2014-07-27 MED ORDER — HYDROMORPHONE 0.3 MG/ML IV SOLN
INTRAVENOUS | Status: DC
  Administered 2014-07-27 (×2): 0.799 mg via INTRAVENOUS
  Administered 2014-07-27: 16:00:00 via INTRAVENOUS
  Administered 2014-07-28 (×2): 0.999 mg via INTRAVENOUS
  Administered 2014-07-28: 2.59 mg via INTRAVENOUS
  Administered 2014-07-28: 1.39 mg via INTRAVENOUS
  Administered 2014-07-28: 0.2 mg via INTRAVENOUS
  Administered 2014-07-28: 0.799 mg via INTRAVENOUS
  Administered 2014-07-29: 0.6 mg via INTRAVENOUS
  Administered 2014-07-29: 1.19 mg via INTRAVENOUS
  Administered 2014-07-29: 09:00:00 via INTRAVENOUS
  Administered 2014-07-29: 1.79 mg via INTRAVENOUS
  Filled 2014-07-27 (×2): qty 25

## 2014-07-27 MED ORDER — ENOXAPARIN SODIUM 30 MG/0.3ML ~~LOC~~ SOLN
30.0000 mg | SUBCUTANEOUS | Status: DC
  Administered 2014-07-28 – 2014-07-30 (×3): 30 mg via SUBCUTANEOUS
  Filled 2014-07-27 (×5): qty 0.3

## 2014-07-27 MED ORDER — ONDANSETRON HCL 4 MG PO TABS: 4.0000 mg | ORAL_TABLET | Freq: Four times a day (QID) | ORAL | Status: DC | PRN

## 2014-07-27 MED ORDER — ACETAMINOPHEN 10 MG/ML IV SOLN
1000.0000 mg | INTRAVENOUS | Status: AC
  Administered 2014-07-27: 1000 mg via INTRAVENOUS
  Filled 2014-07-27: qty 100

## 2014-07-27 MED ORDER — SODIUM CHLORIDE 0.9 % IJ SOLN
INTRAMUSCULAR | Status: AC
  Filled 2014-07-27: qty 10

## 2014-07-27 MED ORDER — BUPIVACAINE-EPINEPHRINE (PF) 0.25% -1:200000 IJ SOLN
INTRAMUSCULAR | Status: AC
  Filled 2014-07-27: qty 30

## 2014-07-27 MED ORDER — PROPOFOL 10 MG/ML IV BOLUS
INTRAVENOUS | Status: AC
  Filled 2014-07-27: qty 20

## 2014-07-27 MED ORDER — ROCURONIUM BROMIDE 100 MG/10ML IV SOLN
INTRAVENOUS | Status: DC | PRN
  Administered 2014-07-27: 5 mg via INTRAVENOUS
  Administered 2014-07-27: 10 mg via INTRAVENOUS
  Administered 2014-07-27: 30 mg via INTRAVENOUS
  Administered 2014-07-27: 10 mg via INTRAVENOUS
  Administered 2014-07-27: 20 mg via INTRAVENOUS
  Administered 2014-07-27: 40 mg via INTRAVENOUS

## 2014-07-27 MED ORDER — ARTIFICIAL TEARS OP OINT
TOPICAL_OINTMENT | OPHTHALMIC | Status: AC
  Filled 2014-07-27: qty 3.5

## 2014-07-27 MED ORDER — ARTIFICIAL TEARS OP OINT
TOPICAL_OINTMENT | OPHTHALMIC | Status: DC | PRN
  Administered 2014-07-27: 1 via OPHTHALMIC

## 2014-07-27 MED ORDER — MIDAZOLAM HCL 2 MG/2ML IJ SOLN
INTRAMUSCULAR | Status: AC
  Filled 2014-07-27: qty 2

## 2014-07-27 MED ORDER — HEPARIN SODIUM (PORCINE) 5000 UNIT/ML IJ SOLN
5000.0000 [IU] | Freq: Once | INTRAMUSCULAR | Status: AC
  Administered 2014-07-27: 5000 [IU] via SUBCUTANEOUS

## 2014-07-27 MED ORDER — DEXAMETHASONE SODIUM PHOSPHATE 4 MG/ML IJ SOLN
INTRAMUSCULAR | Status: DC | PRN
  Administered 2014-07-27: 8 mg via INTRAVENOUS

## 2014-07-27 MED ORDER — LIDOCAINE HCL (CARDIAC) 20 MG/ML IV SOLN
INTRAVENOUS | Status: AC
Start: 2014-07-27 — End: 2014-07-27
  Filled 2014-07-27: qty 5

## 2014-07-27 MED ORDER — BUPIVACAINE-EPINEPHRINE 0.25% -1:200000 IJ SOLN
INTRAMUSCULAR | Status: DC | PRN
  Administered 2014-07-27: 30 mL

## 2014-07-27 MED ORDER — OXYCODONE-ACETAMINOPHEN 5-325 MG PO TABS
1.0000 | ORAL_TABLET | ORAL | Status: DC | PRN
  Filled 2014-07-27: qty 2

## 2014-07-27 MED ORDER — NEOSTIGMINE METHYLSULFATE 10 MG/10ML IV SOLN
INTRAVENOUS | Status: DC | PRN
  Administered 2014-07-27: 4 mg via INTRAVENOUS

## 2014-07-27 MED ORDER — NEOSTIGMINE METHYLSULFATE 10 MG/10ML IV SOLN
INTRAVENOUS | Status: AC
  Filled 2014-07-27: qty 1

## 2014-07-27 MED ORDER — PROMETHAZINE HCL 25 MG/ML IJ SOLN
6.2500 mg | INTRAMUSCULAR | Status: DC | PRN
  Administered 2014-07-27: 6.25 mg via INTRAVENOUS

## 2014-07-27 MED ORDER — KCL IN DEXTROSE-NACL 20-5-0.45 MEQ/L-%-% IV SOLN
INTRAVENOUS | Status: DC
  Administered 2014-07-27 – 2014-07-30 (×5): via INTRAVENOUS
  Filled 2014-07-27 (×8): qty 1000

## 2014-07-27 MED ORDER — MIDAZOLAM HCL 5 MG/5ML IJ SOLN
INTRAMUSCULAR | Status: DC | PRN
  Administered 2014-07-27 (×2): 1 mg via INTRAVENOUS

## 2014-07-27 SURGICAL SUPPLY — 100 items
APPLIER CLIP 9.375 MED OPEN (MISCELLANEOUS) ×6
APPLIER CLIP 9.375 SM OPEN (CLIP) ×6
APR CLP MED 9.3 20 MLT OPN (MISCELLANEOUS) ×2
APR CLP SM 9.3 20 MLT OPN (CLIP) ×2
ATCH SMKEVC FLXB CAUT HNDSWH (FILTER) ×1 IMPLANT
BINDER ABDOMINAL 12 ML 46-62 (SOFTGOODS) ×2 IMPLANT
BLADE 10 SAFETY STRL DISP (BLADE) ×3 IMPLANT
BLADE SURG 10 STRL SS (BLADE) ×3 IMPLANT
BLADE SURG 15 STRL LF DISP TIS (BLADE) ×1 IMPLANT
BLADE SURG 15 STRL SS (BLADE) ×3
CANISTER SUCTION 2500CC (MISCELLANEOUS) ×6 IMPLANT
CHLORAPREP W/TINT 26ML (MISCELLANEOUS) ×6 IMPLANT
CLIP APPLIE 9.375 MED OPEN (MISCELLANEOUS) ×1 IMPLANT
CLIP APPLIE 9.375 SM OPEN (CLIP) IMPLANT
CLOSURE WOUND 1/2 X4 (GAUZE/BANDAGES/DRESSINGS) ×2
CONT SPEC 4OZ CLIKSEAL STRL BL (MISCELLANEOUS) ×7 IMPLANT
COVER PROBE W GEL 5X96 (DRAPES) ×3 IMPLANT
COVER SURGICAL LIGHT HANDLE (MISCELLANEOUS) ×6 IMPLANT
DRAIN CHANNEL 15F RND FF W/TCR (WOUND CARE) ×6 IMPLANT
DRAPE CHEST BREAST 15X10 FENES (DRAPES) ×3 IMPLANT
DRAPE INCISE 23X17 IOBAN STRL (DRAPES)
DRAPE INCISE 23X17 STRL (DRAPES) IMPLANT
DRAPE INCISE IOBAN 23X17 STRL (DRAPES) IMPLANT
DRAPE INCISE IOBAN 85X60 (DRAPES) IMPLANT
DRAPE ORTHO SPLIT 77X108 STRL (DRAPES) ×6
DRAPE PROXIMA HALF (DRAPES) ×9 IMPLANT
DRAPE SURG ORHT 6 SPLT 77X108 (DRAPES) ×2 IMPLANT
DRAPE UTILITY XL STRL (DRAPES) ×6 IMPLANT
DRAPE WARM FLUID 44X44 (DRAPE) ×3 IMPLANT
DRSG MEPILEX BORDER 4X8 (GAUZE/BANDAGES/DRESSINGS) ×3 IMPLANT
DRSG PAD ABDOMINAL 8X10 ST (GAUZE/BANDAGES/DRESSINGS) ×14 IMPLANT
ELECT BLADE 6.5 EXT (BLADE) ×3 IMPLANT
ELECT CAUTERY BLADE 6.4 (BLADE) ×12 IMPLANT
ELECT REM PT RETURN 9FT ADLT (ELECTROSURGICAL) ×6
ELECTRODE REM PT RTRN 9FT ADLT (ELECTROSURGICAL) ×2 IMPLANT
EVACUATOR SILICONE 100CC (DRAIN) ×9 IMPLANT
EVACUATOR SMOKE ACCUVAC VALLEY (FILTER) ×2
GAUZE SPONGE 4X4 12PLY STRL (GAUZE/BANDAGES/DRESSINGS) ×3 IMPLANT
GAUZE XEROFORM 1X8 LF (GAUZE/BANDAGES/DRESSINGS) ×2 IMPLANT
GAUZE XEROFORM 5X9 LF (GAUZE/BANDAGES/DRESSINGS) ×3 IMPLANT
GLOVE BIO SURGEON STRL SZ 6 (GLOVE) ×12 IMPLANT
GLOVE BIO SURGEON STRL SZ 6.5 (GLOVE) ×2 IMPLANT
GLOVE BIO SURGEON STRL SZ7 (GLOVE) ×2 IMPLANT
GLOVE BIO SURGEON STRL SZ7.5 (GLOVE) ×4 IMPLANT
GLOVE BIO SURGEONS STRL SZ 6.5 (GLOVE) ×1
GLOVE BIOGEL PI IND STRL 6.5 (GLOVE) ×2 IMPLANT
GLOVE BIOGEL PI IND STRL 7.0 (GLOVE) ×2 IMPLANT
GLOVE BIOGEL PI INDICATOR 6.5 (GLOVE) ×4
GLOVE BIOGEL PI INDICATOR 7.0 (GLOVE) ×4
GLOVE SURG SIGNA 7.5 PF LTX (GLOVE) ×3 IMPLANT
GLOVE SURG SS PI 6.5 STRL IVOR (GLOVE) ×10 IMPLANT
GOWN STRL REUS W/ TWL LRG LVL3 (GOWN DISPOSABLE) ×5 IMPLANT
GOWN STRL REUS W/ TWL XL LVL3 (GOWN DISPOSABLE) ×1 IMPLANT
GOWN STRL REUS W/TWL LRG LVL3 (GOWN DISPOSABLE) ×18
GOWN STRL REUS W/TWL XL LVL3 (GOWN DISPOSABLE) ×3
KIT BASIN OR (CUSTOM PROCEDURE TRAY) ×6 IMPLANT
KIT ROOM TURNOVER OR (KITS) ×3 IMPLANT
LIQUID BAND (GAUZE/BANDAGES/DRESSINGS) ×10 IMPLANT
MARKER SKIN DUAL TIP RULER LAB (MISCELLANEOUS) ×5 IMPLANT
NDL 18GX1X1/2 (RX/OR ONLY) (NEEDLE) ×1 IMPLANT
NEEDLE 18GX1X1/2 (RX/OR ONLY) (NEEDLE) ×3 IMPLANT
NEEDLE FILTER BLUNT 18X 1/2SAF (NEEDLE) ×2
NEEDLE FILTER BLUNT 18X1 1/2 (NEEDLE) ×1 IMPLANT
NEEDLE HYPO 25GX1X1/2 BEV (NEEDLE) ×6 IMPLANT
NS IRRIG 1000ML POUR BTL (IV SOLUTION) ×9 IMPLANT
PACK GENERAL/GYN (CUSTOM PROCEDURE TRAY) ×6 IMPLANT
PAD ARMBOARD 7.5X6 YLW CONV (MISCELLANEOUS) ×12 IMPLANT
PENCIL BUTTON HOLSTER BLD 10FT (ELECTRODE) ×3 IMPLANT
PREFILTER EVAC NS 1 1/3-3/8IN (MISCELLANEOUS) ×3 IMPLANT
SET COLLECT BLD 21X3/4 12 PB (MISCELLANEOUS) IMPLANT
SPECIMEN JAR X LARGE (MISCELLANEOUS) ×3 IMPLANT
SPONGE GAUZE 4X4 12PLY STER LF (GAUZE/BANDAGES/DRESSINGS) ×6 IMPLANT
SPONGE LAP 18X18 X RAY DECT (DISPOSABLE) ×12 IMPLANT
STAPLER PROXIMATE 75MM BLUE (STAPLE) ×2 IMPLANT
STAPLER VISISTAT 35W (STAPLE) ×6 IMPLANT
STRIP CLOSURE SKIN 1/2X4 (GAUZE/BANDAGES/DRESSINGS) ×4 IMPLANT
SUCTION FRAZIER TIP 8 FR DISP (SUCTIONS) ×2
SUCTION TUBE FRAZIER 8FR DISP (SUCTIONS) IMPLANT
SUT ETHILON 2 0 FS 18 (SUTURE) ×7 IMPLANT
SUT MNCRL AB 3-0 PS2 18 (SUTURE) ×6 IMPLANT
SUT MNCRL AB 4-0 PS2 18 (SUTURE) ×15 IMPLANT
SUT PDS AB 0 CT 36 (SUTURE) ×6 IMPLANT
SUT PDS II 0 TP-1 LOOPED 60 (SUTURE) ×2 IMPLANT
SUT PLAIN 5 0 P 3 18 (SUTURE) ×2 IMPLANT
SUT SILK 2 0 FS (SUTURE) ×3 IMPLANT
SUT SILK 2 0 SH CR/8 (SUTURE) ×2 IMPLANT
SUT VIC AB 3-0 FS2 27 (SUTURE) ×2 IMPLANT
SUT VIC AB 3-0 PS2 18 (SUTURE) ×12
SUT VIC AB 3-0 PS2 18XBRD (SUTURE) ×4 IMPLANT
SUT VIC AB 3-0 SH 18 (SUTURE) ×5 IMPLANT
SUT VIC AB 3-0 SH 8-18 (SUTURE) ×3 IMPLANT
SUT VICRYL 4-0 PS2 18IN ABS (SUTURE) ×8 IMPLANT
SUT VLOC 180 0 24IN GS25 (SUTURE) ×6 IMPLANT
SYR BULB IRRIGATION 50ML (SYRINGE) ×3 IMPLANT
SYR CONTROL 10ML LL (SYRINGE) ×5 IMPLANT
TOWEL OR 17X24 6PK STRL BLUE (TOWEL DISPOSABLE) ×6 IMPLANT
TOWEL OR 17X26 10 PK STRL BLUE (TOWEL DISPOSABLE) ×6 IMPLANT
TRAY FOLEY CATH 14FRSI W/METER (CATHETERS) ×3 IMPLANT
TUBE CONNECTING 12'X1/4 (SUCTIONS) ×2
TUBE CONNECTING 12X1/4 (SUCTIONS) ×3 IMPLANT

## 2014-07-27 NOTE — Anesthesia Preprocedure Evaluation (Addendum)
Anesthesia Evaluation  Patient identified by MRN, date of birth, ID band Patient awake    Reviewed: Allergy & Precautions, H&P , NPO status , Patient's Chart, lab work & pertinent test results, reviewed documented beta blocker date and time   Airway Mallampati: II  TM Distance: >3 FB Neck ROM: Full    Dental  (+) Teeth Intact, Dental Advisory Given   Pulmonary  breath sounds clear to auscultation        Cardiovascular Rhythm:Regular     Neuro/Psych    GI/Hepatic   Endo/Other    Renal/GU      Musculoskeletal   Abdominal (+)  Abdomen: soft.    Peds  Hematology H/H 12/36   Anesthesia Other Findings   Reproductive/Obstetrics                           Anesthesia Physical Anesthesia Plan  ASA: II  Anesthesia Plan: General   Post-op Pain Management: MAC Combined w/ Regional for Post-op pain   Induction: Intravenous  Airway Management Planned:   Additional Equipment:   Intra-op Plan:   Post-operative Plan: Extubation in OR  Informed Consent: I have reviewed the patients History and Physical, chart, labs and discussed the procedure including the risks, benefits and alternatives for the proposed anesthesia with the patient or authorized representative who has indicated his/her understanding and acceptance.     Plan Discussed with:   Anesthesia Plan Comments:         Anesthesia Quick Evaluation

## 2014-07-27 NOTE — Anesthesia Procedure Notes (Addendum)
Anesthesia Regional Block:  Pectoralis block  Pre-Anesthetic Checklist: ,, timeout performed, Correct Patient, Correct Site, Correct Laterality, Correct Procedure, Correct Position, site marked, Risks and benefits discussed,  Surgical consent,  Pre-op evaluation,  At surgeon's request and post-op pain management  Laterality: Right  Prep: chloraprep       Needles:   Needle Type: Echogenic Stimulator Needle     Needle Length: 10cm 10 cm Needle Gauge: 22 and 22 G    Additional Needles:  Procedures: ultrasound guided (picture in chart) Pectoralis block Narrative:  Injection made incrementally with aspirations every 5 mL.  Performed by: Personally  Anesthesiologist: Alexis Frock  Additional Notes: R PEC II Block, 61ml .5% marcaine with epi, multiple asp, deposited between pec minor and seratus ant., no complications, forgot to take picture    Procedure Name: Intubation Date/Time: 07/27/2014 9:46 AM Performed by: Williemae Area B Pre-anesthesia Checklist: Patient identified, Emergency Drugs available, Suction available and Patient being monitored Patient Re-evaluated:Patient Re-evaluated prior to inductionOxygen Delivery Method: Circle system utilized Preoxygenation: Pre-oxygenation with 100% oxygen Intubation Type: IV induction Ventilation: Mask ventilation without difficulty Laryngoscope Size: Mac and 3 Grade View: Grade II Tube type: Oral Tube size: 7.5 mm Number of attempts: 1 Airway Equipment and Method: Stylet Placement Confirmation: ETT inserted through vocal cords under direct vision,  breath sounds checked- equal and bilateral and positive ETCO2 Secured at: 21 (cm at teeth) cm Tube secured with: Tape Dental Injury: Teeth and Oropharynx as per pre-operative assessment

## 2014-07-27 NOTE — Interval H&P Note (Signed)
History and Physical Interval Note:  07/27/2014 7:41 AM  [name]  has presented today for surgery, with the diagnosis of right breast cancer  The various methods of treatment have been discussed with the patient and family. After consideration of risks, benefits and other options for treatment, the patient has consented to  Procedure(s): RIGHT MASTECTOMY WITH RIGHT AXILLARY SENTINEL LYMPH NODE BIOPSY (Right) TRAM FLAP RECONSTUCTION RIGHT CHEST (Right) as a surgical intervention .  The patient's history has been reviewed, patient examined, no change in status, stable for surgery.  I have reviewed the patient's chart and labs.  Questions were answered to the patient's satisfaction.     Vandana Haman

## 2014-07-27 NOTE — Plan of Care (Signed)
Problem: Consults Goal: General Surgical Patient Education (See Patient Education module for education specifics) Outcome: Progressing Goal: Skin Care Protocol Initiated - if Braden Score 18 or less If consults are not indicated, leave blank or document N/A Outcome: Not Applicable Date Met:  07/27/14 Goal: Nutrition Consult-if indicated Outcome: Not Applicable Date Met:  07/27/14 Goal: Diabetes Guidelines if Diabetic/Glucose > 140 If diabetic or lab glucose is > 140 mg/dl - Initiate Diabetes/Hyperglycemia Guidelines & Document Interventions  Outcome: Not Applicable Date Met:  07/27/14  Problem: Phase I Progression Outcomes Goal: Pain controlled with appropriate interventions Outcome: Progressing Dilaudid PCA custom dosing.     Goal: OOB as tolerated unless otherwise ordered Outcome: Not Applicable Date Met:  07/27/14 Bedrest per order at this time.    Goal: Incision/dressings dry and intact Outcome: Completed/Met Date Met:  07/27/14 Goal: Sutures/staples intact Outcome: Progressing Goal: Tubes/drains patent Outcome: Completed/Met Date Met:  07/27/14 To suction, drained Q8 per order     Goal: Initial discharge plan identified Outcome: Completed/Met Date Met:  07/27/14 Goal: Voiding-avoid urinary catheter unless indicated Outcome: Not Met (add Reason) Foley catheter in place upon assessment from PACU Goal: Vital signs/hemodynamically stable Outcome: Completed/Met Date Met:  07/27/14 Goal: Other Phase I Outcomes/Goals Outcome: Completed/Met Date Met:  07/27/14     

## 2014-07-27 NOTE — Interval H&P Note (Signed)
History and Physical Interval Note:  07/27/2014 8:10 AM  Lydia Stevens  has presented today for surgery, with the diagnosis of right breast cancer  The various methods of treatment have been discussed with the patient and family.  Husband and mother at bedside.  She has a good understanding of the operation.  After consideration of risks, benefits and other options for treatment, the patient has consented to  Procedure(s): RIGHT MASTECTOMY WITH RIGHT AXILLARY SENTINEL LYMPH NODE BIOPSY (Right) TRAM FLAP RECONSTUCTION RIGHT CHEST (Right) as a surgical intervention .  The patient's history has been reviewed, patient examined, no change in status, stable for surgery.  I have reviewed the patient's chart and labs.  Questions were answered to the patient's satisfaction.     Abbie Jablon H

## 2014-07-27 NOTE — Brief Op Note (Signed)
07/27/2014  2:33 PM  PATIENT:  Lydia Stevens  43 y.o. female  PRE-OPERATIVE DIAGNOSIS:  right breast cancer  POST-OPERATIVE DIAGNOSIS:  right breast cancer  PROCEDURE:  Procedure(s): RIGHT MASTECTOMY WITH RIGHT AXILLARY SENTINEL LYMPH NODE BIOPSY (Right) TRAM FLAP RECONSTUCTION RIGHT CHEST (Right)  SURGEON:  Surgeon(s) and Role: Panel 1:    * Alphonsa Overall, MD - Primary  Panel 2:    * Irene Limbo, MD - Primary  PHYSICIAN ASSISTANT:   ASSISTANTS: S. Rayburn PA-C   ANESTHESIA:   general  EBL:  Total I/O In: 2100 [I.V.:2100] Out: 700 [Urine:525; Blood:175]  BLOOD ADMINISTERED:none  DRAINS: (15 Fr) Jackson-Pratt drain(s) with closed bulb suction in the right and left abdomen subcutaneous, right breast   LOCAL MEDICATIONS USED:  MARCAINE     SPECIMEN:  Source of Specimen:  additional superior and inferior mastectomy skin flaps  DISPOSITION OF SPECIMEN:  PATHOLOGY  COUNTS:  YES  TOURNIQUET:  * No tourniquets in log *  DICTATION: .Note written in EPIC  PLAN OF CARE: Admit to inpatient   PATIENT DISPOSITION:  PACU - hemodynamically stable.   Delay start of Pharmacological VTE agent (>24hrs) due to surgical blood loss or risk of bleeding: no

## 2014-07-27 NOTE — Transfer of Care (Signed)
Immediate Anesthesia Transfer of Care Note  Patient: Lydia Stevens  Procedure(s) Performed: Procedure(s): RIGHT MASTECTOMY WITH RIGHT AXILLARY SENTINEL LYMPH NODE BIOPSY (Right) TRAM FLAP RECONSTUCTION RIGHT CHEST (Right)  Patient Location: PACU  Anesthesia Type:General  Level of Consciousness: awake and patient cooperative  Airway & Oxygen Therapy: Patient Spontanous Breathing and Patient connected to nasal cannula oxygen  Post-op Assessment: Report given to PACU RN and Post -op Vital signs reviewed and stable  Post vital signs: Reviewed and stable  Complications: No apparent anesthesia complications

## 2014-07-27 NOTE — Op Note (Signed)
Operative Note   DATE OF OPERATION: 12.2.2015  LOCATION: [facility]- inpatient  SURGICAL DIVISION: Plastic Surgery  PREOPERATIVE DIAGNOSES:  1. Right breast cancer 2. Family history breast cancer  POSTOPERATIVE DIAGNOSES:  same  PROCEDURE:  Transverse rectus abdominus myocutaneous flap for right breast reconstruction  SURGEON: Irene Limbo MD MBA  ASSISTANT: S. Rayburn PA-C  ANESTHESIA:  General.   EBL: 150 ml for entire procedure  COMPLICATIONS: None immediate.   INDICATIONS FOR PROCEDURE:  The patient, Lydia Stevens, is a 43 y.o. female born on 1970-10-05, is here for immediate reconstruction with TRAM flap following mastectomy.   FINDINGS: Ipsilateral TRAM flap completed  DESCRIPTION OF PROCEDURE:  Subcutaneous heparin was administered.The patient was taken to the operating room. SCDs were placed and IV antibiotics were given. The patient's operative site was prepped and draped in a sterile fashion. A time out was performed and all information was confirmed to be correct.Following completion of mastectomy, Subcutaneous tunnel developed into abdomen medially from the mastectomy cavity.  A doppler was used to identify the periumbilcal perforators over right hemiabdomen. Low transverse incision made. Superior extent for resection marked by palpation through skin flap that would allow tension free closure and included periumbilical perforators. This was at just superior to level of umbilicus. Umbilicus was released from skin flap and dissected with scissors to abdominal wall. Right hemiabdomen selected for flap and skin an subcutaneous tissue elevated off underlying abdominal wall fascia, progressing from lateral to midline. When lateral row of rectus abdominus muscle perforators encountered, dissection stopped. Incision made in anterior sheath rectus fascia at lower edge of skin paddle. Muscle freed from sheath with blunt dissection. Inferior epigastric pedicle identified and  ligated with hemoclips. Muscle divided with GIA 75 mm stapler caudally. Incision made over left hemiabdomen to include ipsilateral medial and lateral  zones in flap. Approximately 50%f of contralateral medial zone included and the remainder discarded. Incision made in anterior rectus fascia cephalic to skin paddle and muscle freed from fascia. Skin paddle and facsia secured to muscle with interrupted 3-0 vicryl suture. Muscle with skin paddle then placed through subcutaneous tunnel and rotated into breast. Abdomen irrigated thoroughly and hemostasis obtained. Two 15 Fr JP drains placed in subcutaneous position. Anterior sheeth fascia repaired primarily with 0-looped PDS suture and additional interrupted 0-PDS suture infigure of eight fashion. Superiorly based U shaped incision made at lever of anterior iliac spine and umbilicus delivered. Abdomen closed with interrupted 0 PDS suture for approximation of Scarps fascia and 2-0 V-lock suture for approximation dermis. Skin closure completed with 4-0 monocryl subcuticular. Umbilicus inset with interrupted 4-0 vicryl in dermis and running 5-0 plain gut for skin closure. All drains secured with 2-0 nylon.   Mastectomy cavity again irrigated and inspected for hemostasis prior to placement of drain. The flap was tailor tacked into position. Additional mastectomy skin excised to allow inset of flap at desired inframammary fold. Additional areas of flap marked for deepithelization. Following deepithelization, flap then positioned in right breast and secured to underlying pectoralis with interrupted 2-0 PDS suture. Closure with completed with4-0 vicryl in dermis and running 4-0 monocryl subcuticular. Dermabond applied to incisions and Xeroform bolster placed in umbilicus.   Abdominal binder and dry dressing placed. The patient was allowed to wake from anesthesia, extubated and taken to the recovery room in satisfactory condition.   SPECIMENS: additional superior and  inferior mastectomy skin flaps  DRAINS: 15 Fr JP in right and left subcutaneous abdomen, 15 Fr JP in right reconstructed breast  Irene Limbo, MD [facility] Surgery [phone]

## 2014-07-27 NOTE — Anesthesia Postprocedure Evaluation (Signed)
  Anesthesia Post-op Note  Patient: Lydia Stevens  Procedure(s) Performed: Procedure(s): RIGHT MASTECTOMY WITH RIGHT AXILLARY SENTINEL LYMPH NODE BIOPSY (Right) TRAM FLAP RECONSTUCTION RIGHT CHEST (Right)  Patient Location: PACU  Anesthesia Type:General  Level of Consciousness: awake and alert   Airway and Oxygen Therapy: Patient Spontanous Breathing and Patient connected to nasal cannula oxygen  Post-op Pain: mild  Post-op Assessment: Post-op Vital signs reviewed, Patient's Cardiovascular Status Stable, Respiratory Function Stable, Patent Airway and No signs of Nausea or vomiting  Post-op Vital Signs: Reviewed and stable  Last Vitals:  Filed Vitals:   07/27/14 1515  BP:   Pulse: 66  Temp:   Resp: 18    Complications: No apparent anesthesia complications

## 2014-07-27 NOTE — Op Note (Signed)
07/27/2014  10:03 AM  PATIENT:  Lydia Stevens, 43 y.o., female, MRN: [id number]  PREOP DIAGNOSIS:  right breast cancer  POSTOP DIAGNOSIS:   Right breast cancer  , 5 o'clock position (T1, N0)  PROCEDURE:   Procedure(s): RIGHT MASTECTOMY WITH RIGHT AXILLARY SENTINEL LYMPH NODE BIOPSY, Injection of methylene blue (1.5 cc), TRAM FLAP RECONSTUCTION RIGHT CHEST  SURGEON:   Alphonsa Overall, M.D.  ASSISTANT:   B. Iran Planas, MD  ANESTHESIA:   general  Anesthesiologist: Alexis Frock, MD CRNA: Terrill Mohr, CRNA  General  ASA:  2  EBL:  100  ml  BLOOD ADMINISTERED: none  DRAINS: none   LOCAL MEDICATIONS USED:   None  SPECIMEN:   right breast (suture marks lateral margin), right axillary sentinel lymph node (hot and blue)  COUNTS CORRECT:  YES  INDICATIONS FOR PROCEDURE:  [name] is a 43 y.o. (DOB: October 27, 1970) AA  female whose primary care physician is No PCP Per Patient and comes for right mastectomy with TRAM reconstruction for treatment of a right breast cancer.  Drs Lindi Adie and Sondra Come are her treating oncologist.   The indications and risks of the surgery were explained to the patient.  The risks include, but are not limited to, infection, bleeding, and nerve injury.  OPERATIVE NOTE;  The patient was taken to room # 8 at [facility] where she underwent a general anesthesia  supervised by Anesthesiologist: Alexis Frock, MD CRNA: Terrill Mohr, CRNA. Her chest and abdomen were prepped with  ChloraPrep and sterilely draped.    A time-out and the surgical check list was reviewed.    I injected about 0.5 mL of 40% methylene blue around her right areola.   I made an elliptical incision including the areola in the right breast.  I developed skin flaps medially to the lateral edge of the sternum, inferiorly to the investing fascia of the rectus abdominus muscle, laterally to the anterior edge of the latissimus dorsi muscle, and superiorly to about 2 finger breaths  below the clavicle.  The breast was reflected off the pectoralis muscle from medial to lateral.  The lateral attachments in the right axilla were divided and the breast removed.  A long suture was placed on the lateral aspect of the breast.   I dissected into the right axilla and found a sentinel lymph node.  I used the Neoprobe to help identify the sentinel lymph node.   The node had counts of 160 (hot) with a background counts of 0.  The lymph node was blue.  This was sent as a separate specimen.  I also used the Neoprobe to check the internal mammary node area and the supraclavicular area and there was no activity in these areas.   The wound was checked for bleeding.  I irrigated the wound with 1,000 cc of saline.  The wound was open with Dr. Iran Planas in the room.   Dr. Iran Planas assisted with the mastectomy.  She is planing a TRAM reconstruction of the right breast.  She will dictate this portion of the operation.  Alphonsa Overall, MD, [facility] Pager: [phone] Office phone:  [phone]

## 2014-07-28 ENCOUNTER — Other Ambulatory Visit (HOSPITAL_COMMUNITY): Payer: 59

## 2014-07-28 MED ORDER — POLYETHYLENE GLYCOL 3350 17 G PO PACK
17.0000 g | PACK | Freq: Every day | ORAL | Status: DC | PRN
  Administered 2014-07-29: 17 g via ORAL
  Filled 2014-07-28 (×2): qty 1

## 2014-07-28 MED ORDER — SENNOSIDES-DOCUSATE SODIUM 8.6-50 MG PO TABS
1.0000 | ORAL_TABLET | Freq: Every day | ORAL | Status: DC
  Administered 2014-07-28 – 2014-07-29 (×2): 1 via ORAL
  Filled 2014-07-28 (×2): qty 1

## 2014-07-28 NOTE — Progress Notes (Signed)
POD#1 R mastectomy, SLN, TRAM flap  Temp:  [97.6 F (36.4 C)-98.8 F (37.1 C)] 98.3 F (36.8 C) (12/03 0518) Pulse Rate:  [66-90] 79 (12/03 0518) Resp:  [15-25] 18 (12/03 0518) BP: (94-114)/(49-70) 99/52 mmHg (12/03 0518) SpO2:  [98 %-100 %] 98 % (12/03 0518)   JPs 145/20/200 No PO recorded, had nausea now resolved Pain with any movement like "gunshot"  PE  Abdomen soft incisions intact umbilicus viable Chest flap soft developing ecchymoses mastectomy flaps JPs old blood  A/P Need to cut top appr 10 cm of abdominal binder as too tall and putting pressure on breast and pedicle- RN notified D/c foley PT eval, ambulate bent at hips Advance diet Bowel regimen  Irene Limbo, MD [facility] Plastic & Reconstructive Surgery [phone]

## 2014-07-28 NOTE — Progress Notes (Signed)
General Surgery Note  LOS: 1 day  POD -  1 Day Post-Op  Assessment/Plan: 1.  RIGHT MASTECTOMY WITH RIGHT AXILLARY SENTINEL LYMPH NODE BIOPSY, TRAM FLAP RECONSTUCTION RIGHT CHEST - 07/27/2014 - Lydia Stevens/Thimmappa  For right breast cancer - Biopsy - 06/07/2014 - ([id number]) - IDC, Grade 3, ER - 100%, PR - 71%, Her2Neu - negative, Ki67 - 33%  Looks good  2.  Fibroadenoma - Lower inner left breast 3. Scoliosis 4.  DVT prophylaxis - Lovenox  Active Problems:   Breast cancer   Subjective:  Doing okay except for sore throat.  Her husband is in the room with her. Objective:   Filed Vitals:   07/28/14 0518  BP: 99/52  Pulse: 79  Temp: 98.3 F (36.8 C)  Resp: 18     Intake/Output from previous day:  12/02 0701 - 12/03 0700 In: 3100 [I.V.:3100] Out: 2065 [Urine:1525; Drains:365; Blood:175]  Intake/Output this shift:      Physical Exam:   General: WN AA F who is alert and oriented.    HEENT: Normal. Pupils equal. .   Lungs: Clear   Abdomen: Has binder.   Wound: right breast wounds looks good   Lab Results:   No results for input(s): WBC, HGB, HCT, PLT in the last 72 hours.  BMET  No results for input(s): NA, K, CL, CO2, GLUCOSE, BUN, CREATININE, CALCIUM in the last 72 hours.  PT/INR  No results for input(s): LABPROT, INR in the last 72 hours.  ABG  No results for input(s): PHART, HCO3 in the last 72 hours.  Invalid input(s): PCO2, PO2   Studies/Results:  Nm Sentinel Node Inj-no Rpt (breast)  07/27/2014   CLINICAL DATA: right breast cancer   Sulfur colloid was injected intradermally by the nuclear medicine  technologist for breast cancer sentinel node localization.      Anti-infectives:   Anti-infectives    Start     Dose/Rate Route Frequency Ordered Stop   07/27/14 2045  ceFAZolin (ANCEF) IVPB 1 g/50 mL premix     1 g100 mL/hr over 30 Minutes Intravenous Every 8 hours 07/27/14 1734 07/29/14 1244   07/27/14 0600  ceFAZolin (ANCEF) IVPB 2 g/50 mL premix     2 g100  mL/hr over 30 Minutes Intravenous On call to O.R. 07/26/14 1425 07/27/14 1245      Alphonsa Overall, MD, FACS Pager: [place] [facility] Office: [phone] 07/28/2014

## 2014-07-28 NOTE — Evaluation (Signed)
Physical Therapy Evaluation Patient Details Name: Lydia Stevens MRN: [id number] DOB: August 20, 1971 Today's Date: 07/28/2014   History of Present Illness  Patient is a 43 y/o female s/p right mastectomy with right axillary sentinel lymph node biopsy and tram flap reconstruction right chest 07/27/2014 secondary to breast cancer. PMH of scoliosis and anemia.    Clinical Impression  Patient presents with increased pain and generalized weakness limiting mobility on PT evaluation. Pt independent PTA. Tolerated short distance ambulation and able to maintain precautions (bent at hips and not standing straight) during mobility and transfers. Pt would benefit from skilled PT to improve transfers, gait, balance and safe mobility so pt can maximize independence and return to PLOF.    Follow Up Recommendations Supervision/Assistance - 24 hour;No PT follow up    Equipment Recommendations  None recommended by PT    Recommendations for Other Services       Precautions / Restrictions Precautions Precautions: Fall Precaution Comments: Ambulate bent at hips, do not stand up straight or lie flat. Required Braces or Orthoses: Other Brace/Splint Other Brace/Splint: abdominal binder Restrictions Weight Bearing Restrictions: No      Mobility  Bed Mobility Overal bed mobility: Needs Assistance Bed Mobility: Rolling;Sidelying to Sit Rolling: Supervision Sidelying to sit: Min guard;HOB elevated       General bed mobility comments: VC's for log roll technique to reduce pain/stress through abdomen. VC's to keep BLEs flexed during transition and use of rails.  Transfers Overall transfer level: Needs assistance Equipment used: Rolling walker (2 wheeled) Transfers: Sit to/from Stand Sit to Stand: Min guard         General transfer comment: Min guard for safety. Able to maintain precautions of staying bent at hips. Stood from [employer], from [employer].  Ambulation/Gait Ambulation/Gait assistance:  Min guard Ambulation Distance (Feet): 18 Feet (x2 bouts) Assistive device: Rolling walker (2 wheeled) Gait Pattern/deviations: Step-through pattern;Decreased stride length;Trunk flexed   Gait velocity interpretation: Below normal speed for age/gender General Gait Details: Pt with slow, guarded gait due to pain in abdomen. Compliant with precautions of staying bent at hips and not standing straight. Increased pain with mobility.   Stairs            Wheelchair Mobility    Modified Rankin (Stroke Patients Only)       Balance Overall balance assessment: Needs assistance Sitting-balance support: Feet supported;Bilateral upper extremity supported Sitting balance-Leahy Scale: Fair Sitting balance - Comments: BUE support for pain control.    Standing balance support: During functional activity;Bilateral upper extremity supported Standing balance-Leahy Scale: Poor Standing balance comment: Requires BUE support on RW for balance/pain control and safety.                             Pertinent Vitals/Pain Pain Assessment: 0-10 Pain Score: 6  Pain Location: surgical site Pain Descriptors / Indicators: Sore;Aching Pain Intervention(s): Monitored during session;PCA encouraged;Premedicated before session;Repositioned    Home Living Family/patient expects to be discharged to:: Private residence Living Arrangements: Spouse/significant other Available Help at Discharge: Family;Available 24 hours/day Type of Home: House Home Access: Stairs to enter Entrance Stairs-Rails: None Entrance Stairs-Number of Steps: 1 Home Layout: One level Home Equipment: Walker - 2 wheels      Prior Function Level of Independence: Independent         Comments: Husband works 3rd shift so will be there during the day to assist.      [place]  Extremity/Trunk Assessment   Upper Extremity Assessment: Overall WFL for tasks assessed           Lower Extremity  Assessment: Generalized weakness         Communication   Communication: No difficulties  Cognition Arousal/Alertness: Awake/alert Behavior During Therapy: WFL for tasks assessed/performed Overall Cognitive Status: Within Functional Limits for tasks assessed                      General Comments General comments (skin integrity, edema, etc.): Pt emotional during PT evaluation. Discussed purpose of PT evaluation/plans for therapy. Encouraged being OOB and mobility to/from bathroom as tolerated. Discussed precautions.    Exercises        Assessment/Plan    PT Assessment Patient needs continued PT services  PT Diagnosis Difficulty walking;Acute pain   PT Problem List Pain;Decreased activity tolerance;Decreased mobility;Decreased balance  PT Treatment Interventions Balance training;Gait training;Patient/family education;Functional mobility training;Therapeutic activities;Therapeutic exercise;Stair training   PT Goals (Current goals can be found in the Care Plan section) Acute Rehab PT Goals Patient Stated Goal: to return to independence PT Goal Formulation: With patient Time For Goal Achievement: 08/11/14 Potential to Achieve Goals: Good    Frequency Min 4X/week   Barriers to discharge        Co-evaluation               End of Session   Activity Tolerance: Patient limited by pain;Patient tolerated treatment well Patient left: in chair;with call bell/phone within reach Nurse Communication: Mobility status    Functional Assessment Tool Used: Clinical judgment Functional Limitation: Mobility: Walking and moving around Mobility: Walking and Moving Around Current Status (S6015): At least 1 percent but less than 20 percent impaired, limited or restricted Mobility: Walking and Moving Around Goal Status [phone]): At least 1 percent but less than 20 percent impaired, limited or restricted    Time: 0926-0956 PT Time Calculation (min) (ACUTE ONLY): 30  min   Charges:   PT Evaluation $Initial PT Evaluation Tier I: 1 Procedure PT Treatments $Therapeutic Activity: 8-22 mins   PT G Codes:   Functional Assessment Tool Used: Clinical judgment Functional Limitation: Mobility: Walking and moving around    [place], [place] A 07/28/2014, 10:40 AM  Candy Sledge, PT, DPT [phone]

## 2014-07-29 ENCOUNTER — Encounter (HOSPITAL_COMMUNITY): Payer: Self-pay | Admitting: Surgery

## 2014-07-29 MED ORDER — FAMOTIDINE 20 MG PO TABS
20.0000 mg | ORAL_TABLET | Freq: Two times a day (BID) | ORAL | Status: DC
  Administered 2014-07-29 – 2014-07-30 (×2): 20 mg via ORAL
  Filled 2014-07-29 (×2): qty 1

## 2014-07-29 MED ORDER — KETOROLAC TROMETHAMINE 15 MG/ML IJ SOLN
INTRAMUSCULAR | Status: AC
  Filled 2014-07-29: qty 1

## 2014-07-29 MED ORDER — KETOROLAC TROMETHAMINE 15 MG/ML IJ SOLN
15.0000 mg | Freq: Three times a day (TID) | INTRAMUSCULAR | Status: AC
  Administered 2014-07-29 (×3): 15 mg via INTRAVENOUS
  Filled 2014-07-29: qty 1

## 2014-07-29 MED ORDER — HYDROMORPHONE HCL 1 MG/ML IJ SOLN
0.5000 mg | INTRAMUSCULAR | Status: DC | PRN
  Administered 2014-07-30: 1 mg via INTRAVENOUS

## 2014-07-29 MED ORDER — KETOROLAC TROMETHAMINE 30 MG/ML IJ SOLN
INTRAMUSCULAR | Status: AC
  Filled 2014-07-29: qty 1

## 2014-07-29 NOTE — Progress Notes (Signed)
General Surgery Note  LOS: 2 days  POD -  2 Days Post-Op  Epic was down when I made rounds.  Assessment/Plan: 1.  RIGHT MASTECTOMY WITH RIGHT AXILLARY SENTINEL LYMPH NODE BIOPSY, TRAM FLAP RECONSTUCTION RIGHT CHEST - 07/27/2014 - Esli Jernigan/Thimmappa  For right breast cancer - Biopsy - 06/07/2014 - ([id number]) - IDC, Grade 3, ER - 100%, PR - 71%, Her2Neu - negative, Ki67 - 33%  Doing okay.  Still not ambulating enough.  2.  Fibroadenoma - Lower inner left breast 3. Scoliosis 4.  DVT prophylaxis - Lovenox  Active Problems:   Breast cancer   Subjective:  Somewhat distended.  PO intake modest.  Has not walked much.  Her husband is in the room with her. Objective:   Filed Vitals:   07/29/14 0812  BP:   Pulse:   Temp:   Resp: 24     Intake/Output from previous day:  12/03 0701 - 12/04 0700 In: 3952.4 [P.O.:840; I.V.:2712.4; IV Piggyback:400] Out: 210 [Drains:210]  Intake/Output this shift:      Physical Exam:   General: WN AA F who is alert and oriented.    HEENT: Normal. Pupils equal. .   Lungs: Clear   Abdomen: Has binder.   Wound: Right breast wounds looks good.  Drains 1/2/3 - 100/20/90.   Lab Results:   No results for input(s): WBC, HGB, HCT, PLT in the last 72 hours.  BMET  No results for input(s): NA, K, CL, CO2, GLUCOSE, BUN, CREATININE, CALCIUM in the last 72 hours.  PT/INR  No results for input(s): LABPROT, INR in the last 72 hours.  ABG  No results for input(s): PHART, HCO3 in the last 72 hours.  Invalid input(s): PCO2, PO2   Studies/Results:  No results found.   Anti-infectives:   Anti-infectives    Start     Dose/Rate Route Frequency Ordered Stop   07/27/14 2045  ceFAZolin (ANCEF) IVPB 1 g/50 mL premix     1 g100 mL/hr over 30 Minutes Intravenous Every 8 hours 07/27/14 1734 07/29/14 0554   07/27/14 0600  ceFAZolin (ANCEF) IVPB 2 g/50 mL premix     2 g100 mL/hr over 30 Minutes Intravenous On call to O.R. 07/26/14 1425 07/27/14 1245      Alphonsa Overall, MD, FACS Pager: [facility] [facility] Office: [phone] 07/29/2014

## 2014-07-29 NOTE — Progress Notes (Signed)
POD#2 R mastectomy, SLN, TRAM flap  Temp:  [97.9 F (36.6 C)-98.7 F (37.1 C)] 98.2 F (36.8 C) (12/04 0659) Pulse Rate:  [72-99] 99 (12/04 0659) Resp:  [16-24] 24 (12/04 0812) BP: (90-103)/(49-60) 100/57 mmHg (12/04 0659) SpO2:  [93 %-100 %] 96 % (12/04 0812)   JPs 100/20/90 PCA making a lot of noise Ambulated with PT and with RN- got dizzy after short distance in hall  PE  Abdomen soft  Flap viable, soft  A/P Continue ambulation assist, ok to shower Bowel regimen- no BM over 3 days, give Miralax dose today D/c PCA, try oral meds Will give Toradol Anticipate home in day or two, pending pain control SL IV  Irene Limbo, MD [facility] Plastic & Reconstructive Surgery [phone]

## 2014-07-29 NOTE — Progress Notes (Signed)
Physical Therapy Treatment Patient Details Name: Lydia Stevens MRN: [id number] DOB: 10-Dec-1970 Today's Date: 07/29/2014    History of Present Illness Patient is a 43 y/o female s/p right mastectomy with right axillary sentinel lymph node biopsy and tram flap reconstruction right chest 07/27/2014 secondary to breast cancer. PMH of scoliosis and anemia.    PT Comments    Pt demonstrating improved endurance and functional mobility tolerance though still limited due to dizziness. Pt and husband educated on importance of self-monitoring at home and energy conservation techniques. Encouraged to continue frequent mobility with nursing staff as well prior to d/c to increase overall activity tolerance. Pt overall close S/steady A for balance and functional gait with RW at this time and anticipate will continue to progress quickly as pain decreases and endurance increases.   Follow Up Recommendations  Supervision/Assistance - 24 hour;No PT follow up     Equipment Recommendations  None recommended by PT    Recommendations for Other Services       Precautions / Restrictions Precautions Precautions: Fall Precaution Comments: Ambulate bent at hips, do not stand up straight or lie flat. Required Braces or Orthoses: Other Brace/Splint Other Brace/Splint: abdominal binder Restrictions Weight Bearing Restrictions: No    Mobility  Bed Mobility               General bed mobility comments: Pt already up in recliner  Transfers Overall transfer level: Needs assistance Equipment used: Rolling walker (2 wheeled) Transfers: Sit to/from Stand Sit to Stand: Supervision         General transfer comment: steady A to close S for transfers; cues to stay more flexed at hips to maitnain precautions.  Ambulation/Gait Ambulation/Gait assistance: Min guard Ambulation Distance (Feet): 75 Feet Assistive device: Rolling walker (2 wheeled) Gait Pattern/deviations: Step-through pattern;Decreased  stride length;Trunk flexed (trunk flexed due to precautions) Gait velocity: decreased   General Gait Details: Pt limited gait distance due to c/o lightheadedness and feeling uneasy. Resolved with seated break after a few minutes and BP taken (106/63 mmHg).   Stairs            Wheelchair Mobility    Modified Rankin (Stroke Patients Only)       Balance Overall balance assessment: Needs assistance         Standing balance support: No upper extremity supported;During functional activity Standing balance-Leahy Scale: Fair Standing balance comment: Pt donning gown in standing without UE support and close S to steady A intermittently                    Cognition Arousal/Alertness: Awake/alert Behavior During Therapy: WFL for tasks assessed/performed Overall Cognitive Status: Within Functional Limits for tasks assessed                      Exercises      General Comments General comments (skin integrity, edema, etc.): Education provided on energy conservation and self-monitoring of endurance when return to home. Pt still limited with mobility due to dizziness      Pertinent Vitals/Pain Pain Location: surgical site Pain Descriptors / Indicators: Aching Pain Intervention(s): Monitored during session;Premedicated before session    Home Living                      Prior Function            PT Goals (current goals can now be found in the care plan section) Acute Rehab PT Goals Patient Stated  Goal: go home soon PT Goal Formulation: With patient/family Time For Goal Achievement: 08/11/14 Potential to Achieve Goals: Good Progress towards PT goals: Progressing toward goals    Frequency  Min 4X/week    PT Plan      Co-evaluation             End of Session   Activity Tolerance: Other (comment);Patient tolerated treatment well (Mobility distance limited due to becoming dizzy) Patient left: in chair;with call bell/phone within  reach;with family/visitor present     Time: 4627-0350 PT Time Calculation (min) (ACUTE ONLY): 20 min  Charges:  $Gait Training: 8-22 mins                    G Codes:      Allayne Gitelman 07/29/2014, 2:16 PM

## 2014-07-30 MED ORDER — OXYCODONE HCL 5 MG PO TABS
5.0000 mg | ORAL_TABLET | ORAL | Status: DC | PRN
  Administered 2014-07-30: 10 mg via ORAL

## 2014-07-30 MED ORDER — OXYCODONE HCL 5 MG PO TABS
ORAL_TABLET | ORAL | Status: AC
  Filled 2014-07-30: qty 2

## 2014-07-30 MED ORDER — BISACODYL 10 MG RE SUPP
RECTAL | Status: AC
  Administered 2014-07-30: 11:00:00
  Filled 2014-07-30: qty 1

## 2014-07-30 MED ORDER — OXYCODONE HCL 5 MG PO TABS: 5.0000 mg | ORAL_TABLET | ORAL | Status: DC | PRN

## 2014-07-30 MED ORDER — HYDROMORPHONE HCL 1 MG/ML IJ SOLN
INTRAMUSCULAR | Status: AC
  Administered 2014-07-30: 08:00:00
  Filled 2014-07-30: qty 1

## 2014-07-30 MED ORDER — SENNOSIDES-DOCUSATE SODIUM 8.6-50 MG PO TABS: 1.0000 | ORAL_TABLET | Freq: Every evening | ORAL | Status: DC | PRN

## 2014-07-30 MED ORDER — BISACODYL 10 MG RE SUPP
10.0000 mg | Freq: Once | RECTAL | Status: AC
  Administered 2014-07-30: 10 mg via RECTAL

## 2014-07-30 NOTE — Progress Notes (Signed)
3 Days Post-Op  Subjective: Stable and alert. states pain control is good.  Constipated. Taking prune juice. Declines Mira lax. able to ambulate. All 3 drains functioning normally. Pathology pending   Objective: Vital signs in last 24 hours: Temp:  [97.5 F (36.4 C)-98.6 F (37 C)] 97.5 F (36.4 C) (12/05 0449) Pulse Rate:  [80-104] 97 (12/05 0449) Resp:  [17-24] 18 (12/05 0449) BP: (95-111)/(44-66) 105/62 mmHg (12/05 0449) SpO2:  [93 %-98 %] 96 % (12/05 0449)    Intake/Output from previous day: 12/04 0701 - 12/05 0700 In: 3060 [P.O.:1260; I.V.:1800] Out: 1158 [Urine:850; Drains:308] Intake/Output this shift: Total I/O In: 2240 [P.O.:440; I.V.:1800] Out: 988 [Urine:850; Drains:138]  General appearance: Alert. Pleasant. Soft-spoken. Doesn't appear in any distress. Resp: clear to auscultation bilaterally Breasts: normal appearance, no masses or tenderness, Right TRAM flap warm and viable. Skin edges look good. Drainage serosanguineous. GI: Abdomen soft. I did not take down binder.  Lab Results:  No results found for this or any previous visit (from the past 24 hour(s)).   Studies/Results: No results found.  . enoxaparin (LOVENOX) injection  30 mg Subcutaneous Q24H  . famotidine  20 mg Oral BID  . senna-docusate  1 tablet Oral QHS     Assessment/Plan: s/p Procedure(s): RIGHT MASTECTOMY WITH RIGHT AXILLARY SENTINEL LYMPH NODE BIOPSY TRAM FLAP RECONSTUCTION RIGHT CHEST  POD #3. Right mastectomy with TRAM flap reconstruction. Doing well surgically. Continue to advance diet and activities. PT involved. She says she is going home tomorrow Discharge planning per Dr. Ashley Mariner She has an appointment with Dr. Lucia Gaskins on December 23. Check pathology.   @PROBHOSP @  LOS: 3 days    Dae Antonucci M 07/30/2014  . .prob

## 2014-07-30 NOTE — Plan of Care (Signed)
Problem: Phase I Progression Outcomes Goal: Pain controlled with appropriate interventions Outcome: Completed/Met Date Met:  07/30/14 Goal: Voiding-avoid urinary catheter unless indicated Outcome: Completed/Met Date Met:  07/30/14

## 2014-07-30 NOTE — Progress Notes (Signed)
Discharge instructions gone over with patient. Home medications gone over. Prescription given. Diet, activity, and incisional care discussed. Drain care discussed. Husband demonstrates appropriate drain care. Follow up appointments are made. Signs and symptoms of infection and reasons to call the doctor discussed. Patient verbalized understanding of instructions.

## 2014-07-30 NOTE — Plan of Care (Signed)
Problem: Phase I Progression Outcomes Goal: Sutures/staples intact Outcome: Not Applicable Date Met:  24/09/73  Problem: Phase II Progression Outcomes Goal: Progressing with IS, TCDB Outcome: Completed/Met Date Met:  07/30/14  Problem: Phase III Progression Outcomes Goal: Nasogastric tube discontinued Outcome: Not Applicable Date Met:  53/29/92

## 2014-07-30 NOTE — Progress Notes (Signed)
POD#3 R mastectomy, SLN, TRAM flap  Temp:  [97.5 F (36.4 C)-98.6 F (37 C)] 97.5 F (36.4 C) (12/05 0449) Pulse Rate:  [80-104] 97 (12/05 0449) Resp:  [17-22] 18 (12/05 0449) BP: (95-111)/(44-66) 105/62 mmHg (12/05 0449) SpO2:  [93 %-98 %] 96 % (12/05 0449)   JPs 125/38/145 Per pt did take Miralax, no BM- no nausea Had min pain until evening  PE  Abdomen soft umbilicus with dried scabs Flap viable, soft  A/P Assist with shower today if desired.  Bowel regimen- dulcolax today Home today if able to tolerate oxycodone for pain- instructed to use Motrin and or Tylenol at home, continue Miralax PRN and stool softener daily  Irene Limbo, MD [facility] & Reconstructive Surgery [phone]

## 2014-07-30 NOTE — Plan of Care (Signed)
Problem: Phase II Progression Outcomes Goal: Pain controlled Outcome: Completed/Met Date Met:  07/30/14 Goal: Progress activity as tolerated unless otherwise ordered Outcome: Completed/Met Date Met:  07/30/14 Goal: Vital signs stable Outcome: Completed/Met Date Met:  07/30/14 Goal: Surgical site without signs of infection Outcome: Completed/Met Date Met:  07/30/14 Goal: Dressings dry/intact Outcome: Completed/Met Date Met:  07/30/14 Goal: Foley discontinued Outcome: Completed/Met Date Met:  07/30/14 Goal: Tolerating diet Outcome: Completed/Met Date Met:  07/30/14

## 2014-07-30 NOTE — Plan of Care (Signed)
Problem: Discharge Progression Outcomes Goal: Discharge plan in place and appropriate Outcome: Completed/Met Date Met:  07/30/14 Goal: Pain controlled with appropriate interventions Outcome: Completed/Met Date Met:  07/30/14 Goal: Hemodynamically stable Outcome: Completed/Met Date Met:  69/50/72 Goal: Complications resolved/controlled Outcome: Completed/Met Date Met:  07/30/14 Goal: Tolerating diet Outcome: Completed/Met Date Met:  07/30/14 Goal: Activity appropriate for discharge plan Outcome: Completed/Met Date Met:  07/30/14 Goal: Tubes and drains discontinued if indicated Outcome: Completed/Met Date Met:  07/30/14 Goal: Staples/sutures removed Outcome: Not Applicable Date Met:  25/75/05 Goal: Steri-Strips applied Outcome: Not Applicable Date Met:  18/33/58

## 2014-07-31 NOTE — Discharge Summary (Signed)
Physician Discharge Summary  Patient ID: Lydia Stevens MRN: [id number] DOB/AGE: 42-Sep-1972 42 y.o.  Admit date: 07/27/2014 Discharge date: 07/31/2014  Admission Diagnoses: Right breast cancer  Discharge Diagnoses:  Active Problems:   Breast cancer   Discharged Condition: stable  Hospital Course: Patient maintained on PCA for first 2 nights then able to transition to oral pain medication. Worked with PT to learn ambulation restrictions post TRAM. Had not had BM prior to discharge but instructed on continued bowel regimen.  Consults: PT  Treatments: surgery: Right mastectomy with SLN, TRAM flap reconstruction  Discharge Exam: Blood pressure 105/62, pulse 97, temperature 97.5 F (36.4 C), temperature source Oral, resp. rate 18, height 5\' 8"  (1.727 m), weight 91.627 kg (202 lb), last menstrual period 07/11/2014, SpO2 96 %. Incision/Wound: All incisions intact, flap soft and viable  Disposition: 01-Home or Self Care  Discharge Instructions    Call MD for:  redness, tenderness, or signs of infection (pain, swelling, bleeding, redness, odor or green/yellow discharge around incision site)    Complete by:  As directed      Call MD for:  severe or increased pain, loss or decreased feeling  in affected limb(s)    Complete by:  As directed      Discharge instructions    Complete by:  As directed   Ok to remove binder and dressings and shower normally. Pat incisions dry. Dry dressing as desired.  Abdominal binder all times. Soft bra (no underwire) as desired.  Strip and record JP drains twice daily, bring log to clinic visit.  Sleep with two pillows beneath head and one below knees until follow up. Walk bent at hips until comfortable standing straight.     Driving Restrictions    Complete by:  As directed   No driving while taking narcotics     Lifting restrictions    Complete by:  As directed   No lifting greater than 5-10 lbs     Resume previous diet    Complete by:  As  directed             Medication List    TAKE these medications        aspirin-acetaminophen-caffeine 250-250-65 MG per tablet  Commonly known as:  EXCEDRIN MIGRAINE  Take 1 tablet by mouth every 6 (six) hours as needed for headache.     oxyCODONE 5 MG immediate release tablet  Commonly known as:  Oxy IR/ROXICODONE  Take 1-2 tablets (5-10 mg total) by mouth every 4 (four) hours as needed for severe pain.     senna-docusate 8.6-50 MG per tablet  Commonly known as:  Senokot-S  Take 1 tablet by mouth at bedtime as needed for mild constipation.           Follow-up Information    Follow up with [place] [facility], MD.   Specialty:  General Surgery   Why:  as scheduled   Contact information:   [address] [place] [address] [phone]       Follow up with Irene Limbo, MD On 08/05/2014.   Specialty:  Plastic Surgery   Why:  1:45 pm   Contact information:   [place] [place] [address] [phone]       Signed: Irene Limbo 07/31/2014, 8:00 AM

## 2014-08-03 ENCOUNTER — Encounter: Payer: Self-pay | Admitting: *Deleted

## 2014-08-03 ENCOUNTER — Telehealth: Payer: Self-pay | Admitting: Hematology and Oncology

## 2014-08-03 ENCOUNTER — Ambulatory Visit (HOSPITAL_BASED_OUTPATIENT_CLINIC_OR_DEPARTMENT_OTHER): Payer: 59 | Admitting: Hematology and Oncology

## 2014-08-03 VITALS — BP 102/52 | HR 76 | Temp 99.3°F | Resp 20 | Ht 68.0 in | Wt 196.1 lb

## 2014-08-03 DIAGNOSIS — C50311 Malignant neoplasm of lower-inner quadrant of right female breast: Secondary | ICD-10-CM

## 2014-08-03 DIAGNOSIS — Z170 Estrogen receptor positive status [ER+]: Secondary | ICD-10-CM

## 2014-08-03 NOTE — Progress Notes (Signed)
Received order for oncotype testing from Dr. Gudena. Requisition sent to pathology. Received by Tammy. 

## 2014-08-03 NOTE — Telephone Encounter (Signed)
per pof tosch ptappt-gave pt copy of inj

## 2014-08-03 NOTE — Progress Notes (Signed)
Patient Care Team: No Pcp Per Patient as PCP - General (General Practice) Alphonsa Overall, MD as Consulting Physician (General Surgery) Rulon Eisenmenger, MD as Consulting Physician (Hematology and Oncology) Blair Promise, MD as Consulting Physician (Radiation Oncology)  DIAGNOSIS: Breast cancer of lower-inner quadrant of right female breast   Staging form: Breast, AJCC 7th Edition     Clinical: Stage IA (T1b, N0, cM0) - Unsigned       Staging comments: Staged at breast conference 10.28.15      Pathologic: No stage assigned - Unsigned   SUMMARY OF ONCOLOGIC HISTORY:   Breast cancer of lower-inner quadrant of right female breast   06/07/2014 Initial Biopsy Right breast needle biopsy 5:00 position: Invasive ductal carcinoma with DCIS, ER 100%, PR 71%, Ki-67 33%, HER-2 negative ratio 1.03   06/17/2014 Breast MRI Right breast: 10 x 7 x 5 mm biopsy-proven IDC with DCIS, left breast 7 x 7 x 7 mm fibroadenoma, left upper quadrant of the abdomen abutting the peritoneum 3 x 1.1 cm oval soft tissue mass   07/27/2014 Surgery Right breast mastectomy: Invasive ductal carcinoma grade 3, 2 cm, intermediate grade DCIS, lymphovascular invasion identified, 2 SLN negative, T1 C. N0 M0 stage IA, ER positive, PR 7%, HER-2 negative, Ki-67 33%    CHIEF COMPLIANT: followup upper mastectomy  INTERVAL HISTORY: Lydia Stevens is a 42 year old African American lady with above-mentioned history of right-sided breast cancer treated with mastectomy and is here today for followup. She had breast reconstruction extremely sore from recent procedures she has multiple drains appears to be very uncomfortable for followup. She denies any fevers or chills. She is yet to discuss final pathology report and to discuss further testing and treatment options.  REVIEW OF SYSTEMS:   Constitutional: Denies fevers, chills or abnormal weight loss Eyes: Denies blurriness of vision Ears, nose, mouth, throat, and face: Denies mucositis or  sore throat Respiratory: Denies cough, dyspnea or wheezes Cardiovascular: Denies palpitation, chest discomfort or lower extremity swelling Gastrointestinal:  Denies nausea, heartburn or change in bowel habits Skin: Denies abnormal skin rashes Lymphatics: Denies new lymphadenopathy or easy bruising Neurological:Denies numbness, tingling or new weaknesses Behavioral/Psych: Mood is stable, no new changes  Breast:very sore from recent surgeries All other systems were reviewed with the patient and are negative.  I have reviewed the past medical history, past surgical history, social history and family history with the patient and they are unchanged from previous note.  ALLERGIES:  has No Known Allergies.  MEDICATIONS:  Current Outpatient Prescriptions  Medication Sig Dispense Refill  . aspirin-acetaminophen-caffeine (EXCEDRIN MIGRAINE) 250-250-65 MG per tablet Take 1 tablet by mouth every 6 (six) hours as needed for headache.    . oxyCODONE (OXY IR/ROXICODONE) 5 MG immediate release tablet Take 1-2 tablets (5-10 mg total) by mouth every 4 (four) hours as needed for severe pain. 50 tablet 0  . senna-docusate (SENOKOT-S) 8.6-50 MG per tablet Take 1 tablet by mouth at bedtime as needed for mild constipation. 30 tablet 0   No current facility-administered medications for this visit.    PHYSICAL EXAMINATION: ECOG PERFORMANCE STATUS: 1 - Symptomatic but completely ambulatory  Filed Vitals:   08/03/14 1020  BP: 102/52  Pulse: 76  Temp: 99.3 F (37.4 C)  Resp: 20   Filed Weights   08/03/14 1020  Weight: 196 lb 1.6 oz (88.95 kg)    GENERAL:alert, no distress and comfortable SKIN: skin color, texture, turgor are normal, no rashes or significant lesions EYES: normal, Conjunctiva are  pink and non-injected, sclera clear OROPHARYNX:no exudate, no erythema and lips, buccal mucosa, and tongue normal  NECK: supple, thyroid normal size, non-tender, without nodularity LYMPH:  no palpable  lymphadenopathy in the cervical, axillary or inguinal LUNGS: clear to auscultation and percussion with normal breathing effort HEART: regular rate & rhythm and no murmurs and no lower extremity edema ABDOMEN:abdomen soft, non-tender and normal bowel sounds Musculoskeletal:no cyanosis of digits and no clubbing  NEURO: alert & oriented x 3 with fluent speech, no focal motor/sensory deficits  LABORATORY DATA:  I have reviewed the data as listed   Chemistry      Component Value Date/Time   NA 144 07/22/2014 1055   NA 141 06/22/2014 0829   K 3.6* 07/22/2014 1055   K 3.6 06/22/2014 0829   CL 107 07/22/2014 1055   CO2 23 07/22/2014 1055   CO2 24 06/22/2014 0829   BUN 10 07/22/2014 1055   BUN 10.4 06/22/2014 0829   CREATININE 0.85 07/22/2014 1055   CREATININE 0.9 06/22/2014 0829      Component Value Date/Time   CALCIUM 9.1 07/22/2014 1055   CALCIUM 9.6 06/22/2014 0829   ALKPHOS 97 06/22/2014 0829   AST 10 06/22/2014 0829   ALT 14 06/22/2014 0829   BILITOT 0.63 06/22/2014 0829       Lab Results  Component Value Date   WBC 7.8 07/22/2014   HGB 12.3 07/22/2014   HCT 36.8 07/22/2014   MCV 85.8 07/22/2014   PLT 273 07/22/2014   NEUTROABS 6.6* 06/22/2014    ASSESSMENT & PLAN:  Breast cancer of lower-inner quadrant of right female breast Right breast invasive ductal carcinoma status post mastectomy 12 2015 followed by reconstruction 2 cm tumor with intermediate grade DCIS, lymphovascular invasion noted, 2 SLN negative, grade 3, T1 C. N0 M0 stage IA  Pathology counseling: I discussed the pathology report in great detail including the final pathologic staging as well as the lymph node status. Patient understands that the high risk features of her cancer are lymphovascular invasion as well as being grade 3. She is ER/PR positive HER-2 negative with a Ki-67 of 33%.  Recommendation: Oncotype DX testing followed by discussion regarding chemotherapy. Continue with once a month Zoladex  injections for ovarian suppression. Return to clinic December 28 to discuss Oncotype DX results as well as to receive Zoladex shot.    No orders of the defined types were placed in this encounter.   The patient has a good understanding of the overall plan. she agrees with it. She will call with any problems that may develop before her next visit here.   Rulon Eisenmenger, MD 08/03/2014 11:00 AM

## 2014-08-03 NOTE — Assessment & Plan Note (Signed)
Right breast invasive ductal carcinoma status post mastectomy 12 2015 followed by reconstruction 2 cm tumor with intermediate grade DCIS, lymphovascular invasion noted, 2 SLN negative, grade 3, T1 C. N0 M0 stage IA  Pathology counseling: I discussed the pathology report in great detail including the final pathologic staging as well as the lymph node status. Patient understands that the high risk features of her cancer are lymphovascular invasion as well as being grade 3. She is ER/PR positive HER-2 negative with a Ki-67 of 33%.  Recommendation: Oncotype DX testing followed by discussion regarding chemotherapy. Continue with once a month Zoladex injections for ovarian suppression. Return to clinic December 28 to discuss Oncotype DX results as well as to receive Zoladex shot.

## 2014-08-12 ENCOUNTER — Encounter (HOSPITAL_COMMUNITY): Payer: Self-pay

## 2014-08-15 ENCOUNTER — Other Ambulatory Visit (HOSPITAL_COMMUNITY): Payer: Self-pay | Admitting: Plastic Surgery

## 2014-08-15 DIAGNOSIS — S301XXA Contusion of abdominal wall, initial encounter: Secondary | ICD-10-CM

## 2014-08-16 ENCOUNTER — Other Ambulatory Visit: Payer: Self-pay | Admitting: Radiology

## 2014-08-16 ENCOUNTER — Encounter (HOSPITAL_COMMUNITY): Payer: Self-pay

## 2014-08-17 ENCOUNTER — Encounter (HOSPITAL_COMMUNITY): Payer: Self-pay

## 2014-08-17 ENCOUNTER — Ambulatory Visit (HOSPITAL_COMMUNITY)
Admission: RE | Admit: 2014-08-17 | Discharge: 2014-08-17 | Disposition: A | Discharge status: Home / Self Care | Payer: 59 | Source: Ambulatory Visit | Attending: Plastic Surgery | Admitting: Plastic Surgery

## 2014-08-17 ENCOUNTER — Other Ambulatory Visit (HOSPITAL_COMMUNITY): Payer: Self-pay | Admitting: Plastic Surgery

## 2014-08-17 DIAGNOSIS — S301XXA Contusion of abdominal wall, initial encounter: Secondary | ICD-10-CM

## 2014-08-17 DIAGNOSIS — C50911 Malignant neoplasm of unspecified site of right female breast: Secondary | ICD-10-CM | POA: Diagnosis present

## 2014-08-17 DIAGNOSIS — M419 Scoliosis, unspecified: Secondary | ICD-10-CM | POA: Diagnosis not present

## 2014-08-17 LAB — CBC
HCT: 37.5 % (ref 36.0–46.0)
Hemoglobin: 12.3 g/dL (ref 12.0–15.0)
MCH: 29 pg (ref 26.0–34.0)
MCHC: 32.8 g/dL (ref 30.0–36.0)
MCV: 88.4 fL (ref 78.0–100.0)
Platelets: 307 10*3/uL (ref 150–400)
RBC: 4.24 MIL/uL (ref 3.87–5.11)
RDW: 15.5 % (ref 11.5–15.5)
WBC: 8.5 10*3/uL (ref 4.0–10.5)

## 2014-08-17 LAB — PROTIME-INR
INR: 1.1 (ref 0.00–1.49)
PROTHROMBIN TIME: 14.3 s (ref 11.6–15.2)

## 2014-08-17 LAB — APTT: aPTT: 34 seconds (ref 24–37)

## 2014-08-17 LAB — HCG, QUANTITATIVE, PREGNANCY: hCG, Beta Chain, Quant, S: <1 m[IU]/mL (ref <5)

## 2014-08-17 MED ORDER — SODIUM CHLORIDE 0.9 % IV SOLN
Freq: Once | INTRAVENOUS | Status: AC
  Administered 2014-08-17: 12:00:00 via INTRAVENOUS

## 2014-08-17 MED ORDER — MIDAZOLAM HCL 2 MG/2ML IJ SOLN
INTRAMUSCULAR | Status: AC | PRN
  Administered 2014-08-17 (×2): 1 mg via INTRAVENOUS

## 2014-08-17 MED ORDER — MIDAZOLAM HCL 2 MG/2ML IJ SOLN
INTRAMUSCULAR | Status: AC
  Filled 2014-08-17: qty 4

## 2014-08-17 MED ORDER — FENTANYL CITRATE 0.05 MG/ML IJ SOLN
INTRAMUSCULAR | Status: AC
  Filled 2014-08-17: qty 4

## 2014-08-17 MED ORDER — LIDOCAINE HCL (PF) 1 % IJ SOLN
INTRAMUSCULAR | Status: AC
  Filled 2014-08-17: qty 10

## 2014-08-17 MED ORDER — FENTANYL CITRATE 0.05 MG/ML IJ SOLN
INTRAMUSCULAR | Status: AC | PRN
  Administered 2014-08-17 (×2): 50 ug via INTRAVENOUS

## 2014-08-17 NOTE — H&P (Signed)
Chief Complaint: Rt Breast cancer   Referring Physician(s): Thimmappa,Brinda  History of Present Illness: Lydia Stevens is a 43 y.o. female  Breast Cancer Rt mastectomy 07/27/2014 Seroma Rt chest wall per order from Dr Iran Planas Pt states she can feel tightness and fluid wave in upper abdomen And Rt chest wall area Noticed swelling now several days Scheduled now for evaluation and possible seroma aspiration possible drain placement  Past Medical History  Diagnosis Date  . Scoliosis   . Breast cancer     2015  . Anemia     before     Past Surgical History  Procedure Laterality Date  . Cesarean section  1996  . Wisdom tooth extraction    . Mastectomy w/ sentinel node biopsy Right 07/27/2014    Procedure: RIGHT MASTECTOMY WITH RIGHT AXILLARY SENTINEL LYMPH NODE BIOPSY;  Surgeon: Alphonsa Overall, MD;  Location: [facility];  Service: General;  Laterality: Right;  . Latissimus flap to breast Right 07/27/2014    Procedure: TRAM FLAP RECONSTUCTION RIGHT CHEST;  Surgeon: Irene Limbo, MD;  Location: [facility];  Service: Plastics;  Laterality: Right;    Allergies: Review of patient's allergies indicates no known allergies.  Medications: Prior to Admission medications   Medication Sig Start Date End Date Taking? Authorizing Provider  aspirin-acetaminophen-caffeine (EXCEDRIN MIGRAINE) [phone] MG per tablet Take 1 tablet by mouth every 6 (six) hours as needed for headache.   Yes Historical Provider, MD  bisacodyl (DULCOLAX) 10 MG suppository Place 10 mg rectally daily as needed for moderate constipation.   Yes Historical Provider, MD  oxyCODONE (OXY IR/ROXICODONE) 5 MG immediate release tablet Take 1-2 tablets (5-10 mg total) by mouth every 4 (four) hours as needed for severe pain. 07/30/14  Yes Irene Limbo, MD  senna-docusate (SENOKOT-S) 8.6-50 MG per tablet Take 1 tablet by mouth at bedtime as needed for mild constipation. 07/30/14  Yes Irene Limbo, MD    Family History   Problem Relation Age of Onset  . Breast cancer Paternal Aunt 18  . Heart attack Father   . Breast cancer Maternal Aunt 46  . Breast cancer Sister 12  . Breast cancer Maternal Aunt 54    History   Social History  . Marital Status: Married    Spouse Name: N/A    Number of Children: 1  . Years of Education: N/A   Social History Main Topics  . Smoking status: Never Smoker   . Smokeless tobacco: Never Used  . Alcohol Use: No  . Drug Use: No  . Sexual Activity: None   Other Topics Concern  . None   Social History Narrative     Review of Systems: A 12 point ROS discussed and pertinent positives are indicated in the HPI above.  All other systems are negative.  Review of Systems  Constitutional: Positive for activity change and fatigue.  Respiratory: Positive for chest tightness. Negative for cough and shortness of breath.   Cardiovascular: Positive for chest pain.  Gastrointestinal: Positive for abdominal pain.  Genitourinary: Negative for difficulty urinating.  Musculoskeletal: Negative for back pain.  Neurological: Positive for weakness.  Psychiatric/Behavioral: Negative for behavioral problems and confusion.    Vital Signs: BP 106/66 mmHg  Pulse 80  Temp(Src) 98.3 F (36.8 C) (Oral)  Resp 20  Ht 5\' 8"  (1.727 m)  Wt 86.183 kg (190 lb)  BMI 28.90 kg/m2  SpO2 99%  LMP 07/11/2014  Physical Exam  Constitutional: She is oriented to person, place, and time. She appears  well-nourished.  Cardiovascular: Normal rate, regular rhythm and normal heart sounds.   No murmur heard. Pulmonary/Chest: Effort normal and breath sounds normal. She has no wheezes.  Abdominal: Soft. Bowel sounds are normal. There is no tenderness.  Musculoskeletal: Normal range of motion.  Neurological: She is alert and oriented to person, place, and time.  Skin: Skin is warm and dry.  Psychiatric: She has a normal mood and affect. Her behavior is normal. Judgment and thought content normal.    Nursing note and vitals reviewed.   Imaging: Nm Sentinel Node Inj-no Rpt (breast)  07/27/2014   CLINICAL DATA: right breast cancer   Sulfur colloid was injected intradermally by the nuclear medicine  technologist for breast cancer sentinel node localization.     Labs:  CBC:  Recent Labs  06/22/14 0828 07/22/14 1055  WBC 8.9 7.8  HGB 12.9 12.3  HCT 39.3 36.8  PLT 290 273    COAGS: No results for input(s): INR, APTT in the last 8760 hours.  BMP:  Recent Labs  06/22/14 0829 07/22/14 1055  NA 141 144  K 3.6 3.6*  CL  --  107  CO2 24 23  GLUCOSE 98 82  BUN 10.4 10  CALCIUM 9.6 9.1  CREATININE 0.9 0.85  GFRNONAA  --  83*  GFRAA  --  >90    LIVER FUNCTION TESTS:  Recent Labs  06/22/14 0829  BILITOT 0.63  AST 10  ALT 14  ALKPHOS 97  PROT 7.3  ALBUMIN 3.8    TUMOR MARKERS: No results for input(s): AFPTM, CEA, CA199, CHROMGRNA in the last 8760 hours.  Assessment and Plan:  Breast Ca Rt mastectomy 07/27/2014 Rt chest wall swelling- probable seroma Scheduled for evaluation with possible aspiration/drain placement Pt aware of procedure benefits and risks and agreeable to proceed Consent signed andin chart  Thank you for this interesting consult.  I greatly enjoyed meeting Lydia Stevens and look forward to participating in their care.     I spent a total of 20 minutes face to face in clinical consultation, greater than 50% of which was counseling/coordinating care for seroma aspiration/drain  Signed: Joenathan Sakuma A 08/17/2014, 11:30 AM

## 2014-08-17 NOTE — Procedures (Signed)
US guided drainage of abdominal seroma.  60 ml of red serous fluid was removed.  No immediate complication.

## 2014-08-17 NOTE — Sedation Documentation (Signed)
33ml Fluid drained off

## 2014-08-17 NOTE — Discharge Instructions (Signed)
Fine Needle Aspiration °Fine needle aspiration is a procedure used to remove a piece of tissue. The tissue may be removed from a swelling or abnormal growth (tumor). It is also used to confirm a cyst. A cyst is a fluid filled sac. The procedure may be done by your caregiver or a specialist. °LET YOUR CAREGIVER KNOW ABOUT:  °· Any medications you take, especially blood thinners like aspirin. °· Any problems you may have had with similar procedures in the past. °RISKS AND COMPLICATIONS °This is a safe procedure. There is a very small risk of infection and or bleeding. Other complications can occur if the aspiration site is deep in the body. Your caregiver or specialist will explain this to you.  °BEFORE THE PROCEDURE  °· No special preparation is needed in most cases. Your caregiver will let you know if there are special requirements, such as an empty stomach. °· Be sure to ask your caregiver any questions before the procedure starts. °PROCEDURE  °· This procedure is done under local or no anesthetic. °· The skin is cleaned carefully. °· A thin needle is directed into a lump. The needle is directed in several different directions into the lump while suction is applied to the needle. When samples are removed, the needle is withdrawn. °· The contents obtained are placed on a slide. They are then fixed, stained and examined under the microscope. The slide is examined by a specialist in the examination of tissue (pathologist). °· A diagnosis can then be made. The pathologist will decide if the specimen is cancerous (malignant) or not cancerous (benign). If fluid is taken from a cyst, cells from the fluid can be examined. If no material is obtained from a fine needle aspiration, the sample may not rule out a problem. Sometimes the procedure is done again. The pathologist may need several days before a result is available. °AFTER THE PROCEDURE  °· There are usually no limits on diet or activity after a fine needle  aspiration. °· Your caregiver may give you instructions regarding the aspiration site. This will include information about keeping the site clean and dry. Follow these instructions carefully. °· Call for your test results as instructed by your caregiver. Remember, it is your responsibility to get the results of your testing. Do not think everything is fine if you have not heard from your caregiver. °· Keep a close watch on the aspiration site. Report any redness, swelling or drainage. °· You should not have much pain at the aspiration site. °· Take any medications as told by your caregiver. °SEEK MEDICAL CARE IF:  °· You have pain or drainage at the aspiration site that does not go away. °· Swelling at the aspiration site does not gradually go away. °SEEK IMMEDIATE MEDICAL CARE IF:  °· You develop a fever a day or two after the aspiration. °· You develop severe pain at the aspiration site. °· You develop a warm, tender swelling at the aspiration site. °Document Released: 08/09/2000 Document Revised: 11/04/2011 Document Reviewed: 04/22/2008 °Exit[employer]® Patient Information ©2015 [employer]. This information is not intended to replace advice given to you by your health care provider. Make sure you discuss any questions you have with your health care provider. ° °

## 2014-08-17 NOTE — Progress Notes (Signed)
Jannifer Franklin PA notified that pt ate 1 inch square of bread with pain med this morning@ 8;15.

## 2014-08-17 NOTE — Sedation Documentation (Signed)
Bandaid applied to site Right lower abd.

## 2014-08-18 ENCOUNTER — Ambulatory Visit: Payer: 59

## 2014-08-22 ENCOUNTER — Ambulatory Visit: Payer: 59

## 2014-08-22 ENCOUNTER — Ambulatory Visit (HOSPITAL_BASED_OUTPATIENT_CLINIC_OR_DEPARTMENT_OTHER): Payer: 59 | Admitting: Hematology and Oncology

## 2014-08-22 ENCOUNTER — Telehealth: Payer: Self-pay | Admitting: Hematology and Oncology

## 2014-08-22 ENCOUNTER — Encounter: Payer: Self-pay | Admitting: *Deleted

## 2014-08-22 VITALS — BP 97/54 | HR 70 | Temp 98.6°F | Resp 20 | Ht 68.0 in | Wt 193.6 lb

## 2014-08-22 DIAGNOSIS — C50311 Malignant neoplasm of lower-inner quadrant of right female breast: Secondary | ICD-10-CM

## 2014-08-22 MED ORDER — GOSERELIN ACETATE 3.6 MG ~~LOC~~ IMPL
3.6000 mg | DRUG_IMPLANT | Freq: Once | SUBCUTANEOUS | Status: DC
  Filled 2014-08-22: qty 3.6

## 2014-08-22 NOTE — Telephone Encounter (Signed)
cld 7 spoke to pt & gave time & date for chemo class-sent MW emailt o sch trmt-pt stated has MY CHART & will look for upcoming dates & appt

## 2014-08-22 NOTE — Patient Instructions (Signed)
Patient 3 weeks out from TRAM surgery.  Patient sent to see Dr Lindi Adie first.  Per Dr Lindi Adie no Zoladex given today.

## 2014-08-22 NOTE — Progress Notes (Signed)
Patient Care Team: No Pcp Per Patient as PCP - General (General Practice) Alphonsa Overall, MD as Consulting Physician (General Surgery) Rulon Eisenmenger, MD as Consulting Physician (Hematology and Oncology) Blair Promise, MD as Consulting Physician (Radiation Oncology)  DIAGNOSIS: Breast cancer of lower-inner quadrant of right female breast   Staging form: Breast, AJCC 7th Edition     Clinical: Stage IA (T1b, N0, cM0) - Unsigned       Staging comments: Staged at breast conference 10.28.15      Pathologic: No stage assigned - Unsigned   SUMMARY OF ONCOLOGIC HISTORY:   Breast cancer of lower-inner quadrant of right female breast   06/07/2014 Initial Biopsy Right breast needle biopsy 5:00 position: Invasive ductal carcinoma with DCIS, ER 100%, PR 71%, Ki-67 33%, HER-2 negative ratio 1.03   06/17/2014 Breast MRI Right breast: 10 x 7 x 5 mm biopsy-proven IDC with DCIS, left breast 7 x 7 x 7 mm fibroadenoma, left upper quadrant of the abdomen abutting the peritoneum 3 x 1.1 cm oval soft tissue mass   07/27/2014 Surgery Right breast mastectomy: Invasive ductal carcinoma grade 3, 2 cm, intermediate grade DCIS, lymphovascular invasion identified, 2 SLN negative, T1 C. N0 M0 stage IA, ER positive, PR 7%, HER-2 negative, Ki-67 33%    CHIEF COMPLIANT: Follow-up on Oncotype DX  INTERVAL HISTORY: Lydia Stevens is a 43 year old lady with above-mentioned history of right-sided breast cancer treated with mastectomy followed by reconstruction with a TRAM flap. She had a recent problem with a TRAM flap area where food was removed. She is here today to discuss the results of Oncotype DX testing. Her pain in the belly is improving slowly.  REVIEW OF SYSTEMS:   Constitutional: Denies fevers, chills or abnormal weight loss Eyes: Denies blurriness of vision Ears, nose, mouth, throat, and face: Denies mucositis or sore throat Respiratory: Denies cough, dyspnea or wheezes Cardiovascular: Denies palpitation,  chest discomfort or lower extremity swelling Gastrointestinal:  Denies nausea, heartburn or change in bowel habits Skin: Denies abnormal skin rashes Lymphatics: Denies new lymphadenopathy or easy bruising Neurological:Denies numbness, tingling or new weaknesses Behavioral/Psych: Mood is stable, no new changes  Breast:  denies any pain or lumps or nodules in either breasts All other systems were reviewed with the patient and are negative.  I have reviewed the past medical history, past surgical history, social history and family history with the patient and they are unchanged from previous note.  ALLERGIES:  has No Known Allergies.  MEDICATIONS:  Current Outpatient Prescriptions  Medication Sig Dispense Refill  . aspirin-acetaminophen-caffeine (EXCEDRIN MIGRAINE) 250-250-65 MG per tablet Take 1 tablet by mouth every 6 (six) hours as needed for headache.    . bisacodyl (DULCOLAX) 10 MG suppository Place 10 mg rectally daily as needed for moderate constipation.    Marland Kitchen oxyCODONE (OXY IR/ROXICODONE) 5 MG immediate release tablet Take 1-2 tablets (5-10 mg total) by mouth every 4 (four) hours as needed for severe pain. 50 tablet 0  . senna-docusate (SENOKOT-S) 8.6-50 MG per tablet Take 1 tablet by mouth at bedtime as needed for mild constipation. 30 tablet 0   No current facility-administered medications for this visit.   Facility-Administered Medications Ordered in Other Visits  Medication Dose Route Frequency Provider Last Rate Last Dose  . goserelin (ZOLADEX) injection 3.6 mg  3.6 mg Subcutaneous Once Rulon Eisenmenger, MD        PHYSICAL EXAMINATION: ECOG PERFORMANCE STATUS: 1 - Symptomatic but completely ambulatory  Filed Vitals:   08/22/14  1205  BP: 97/54  Pulse: 70  Temp: 98.6 F (37 C)  Resp: 20   Filed Weights   08/22/14 1205  Weight: 193 lb 9.6 oz (87.816 kg)    GENERAL:alert, no distress and comfortable SKIN: skin color, texture, turgor are normal, no rashes or significant  lesions EYES: normal, Conjunctiva are pink and non-injected, sclera clear OROPHARYNX:no exudate, no erythema and lips, buccal mucosa, and tongue normal  NECK: supple, thyroid normal size, non-tender, without nodularity LYMPH:  no palpable lymphadenopathy in the cervical, axillary or inguinal LUNGS: clear to auscultation and percussion with normal breathing effort HEART: regular rate & rhythm and no murmurs and no lower extremity edema ABDOMEN: TRAM flap pain Musculoskeletal:no cyanosis of digits and no clubbing  NEURO: alert & oriented x 3 with fluent speech, no focal motor/sensory deficits   LABORATORY DATA:  I have reviewed the data as listed   Chemistry      Component Value Date/Time   NA 144 07/22/2014 1055   NA 141 06/22/2014 0829   K 3.6* 07/22/2014 1055   K 3.6 06/22/2014 0829   CL 107 07/22/2014 1055   CO2 23 07/22/2014 1055   CO2 24 06/22/2014 0829   BUN 10 07/22/2014 1055   BUN 10.4 06/22/2014 0829   CREATININE 0.85 07/22/2014 1055   CREATININE 0.9 06/22/2014 0829      Component Value Date/Time   CALCIUM 9.1 07/22/2014 1055   CALCIUM 9.6 06/22/2014 0829   ALKPHOS 97 06/22/2014 0829   AST 10 06/22/2014 0829   ALT 14 06/22/2014 0829   BILITOT 0.63 06/22/2014 0829       Lab Results  Component Value Date   WBC 8.5 08/17/2014   HGB 12.3 08/17/2014   HCT 37.5 08/17/2014   MCV 88.4 08/17/2014   PLT 307 08/17/2014   NEUTROABS 6.6* 06/22/2014   ASSESSMENT & PLAN:  Breast cancer of lower-inner quadrant of right female breast Right breast invasive ductal carcinoma status post mastectomy 12 2015 followed by reconstruction 2 cm tumor with intermediate grade DCIS, lymphovascular invasion noted, 2 SLN negative, grade 3, T1 C. N0 M0 stage IA, Oncotype DX score 24, 16% risk of recurrence  Oncotype DX discussion: I reviewed the Oncotype DX score in great detail and base of the score, if she took 5 years of tamoxifen her 10 year risk of distant recurrence at 16%. This  would be considered intermediate risk of recurrence. I offered her systemic chemotherapy based on the intermediate risk of recurrence as well as the presence of lymphovascular invasion. I recommended 4 cycles of Taxotere and Cytoxan.  Chemotherapy counseling: I discussed with her risks of benefits of chemotherapy including the risk of infection, hair loss, nausea vomiting, cytopenias, neuropathy risks, fatigue, mouth sores, rashes, liver and kidney function abnormalities. Patient is willing to undergo chemotherapy. I would like to see her back in 2 weeks to start treatment. She will need a port placement and chemotherapy teaching prior to starting treatment. We will consult Dr. Lucia Gaskins for port placement. I will also discuss with Dr. Leland Johns if she can get started on chemotherapy in 2 weeks time. I discussed with her that she could potentially get chemotherapy through peripheral IV but she has very poor venous access and hence we will plan on doing a port.  Continue with Zoladex injections for ovarian suppression once a month. Because she had TRAM flap, we cannot give the injection to the abdominal area. So we will postpone her Zoladex injection for next month.  Orders Placed This Encounter  Procedures  . CBC with Differential    Standing Status: Future     Number of Occurrences:      Standing Expiration Date: 08/22/2015  . Comprehensive metabolic panel (Cmet) - [facility]    Standing Status: Future     Number of Occurrences:      Standing Expiration Date: 08/22/2015   The patient has a good understanding of the overall plan. she agrees with it. She will call with any problems that may develop before her next visit here.   Rulon Eisenmenger, MD 08/22/2014 12:37 PM

## 2014-08-22 NOTE — Progress Notes (Signed)
Received Oncotype Dx results of 24.  Placed a copy in Dr. Geralyn Flash box and took a copy to HIM to scan.

## 2014-08-22 NOTE — Assessment & Plan Note (Signed)
Right breast invasive ductal carcinoma status post mastectomy 12 2015 followed by reconstruction 2 cm tumor with intermediate grade DCIS, lymphovascular invasion noted, 2 SLN negative, grade 3, T1 C. N0 M0 stage IA, Oncotype DX score 24, 16% risk of recurrence  Oncotype DX discussion: I reviewed the Oncotype DX score in great detail and base of the score, if she took 5 years of tamoxifen her 10 year risk of distant recurrence at 16%. This would be considered intermediate risk of recurrence. I offered her systemic chemotherapy based on the intermediate risk of recurrence as well as the presence of lymphovascular invasion. I recommended 4 cycles of Taxotere and Cytoxan.  Chemotherapy counseling: I discussed with her risks of benefits of chemotherapy including the risk of infection, hair loss, nausea vomiting, cytopenias, neuropathy risks, fatigue, mouth sores, rashes, liver and kidney function abnormalities. Patient is willing to undergo chemotherapy. I would like to see her back in 2 weeks to start treatment. She will need a port placement and chemotherapy teaching prior to starting treatment. We will consult Dr. Lucia Gaskins for port placement. I will also discuss with Dr. Leland Johns if she can get started on chemotherapy in 2 weeks time.   Continue with Zoladex injections for ovarian suppression once a month. Because she had TRAM flap, we cannot give the injection to the abdominal area. So we will postpone her Zoladex injection for next month.

## 2014-08-23 ENCOUNTER — Telehealth: Payer: Self-pay | Admitting: *Deleted

## 2014-08-23 ENCOUNTER — Other Ambulatory Visit (INDEPENDENT_AMBULATORY_CARE_PROVIDER_SITE_OTHER): Payer: Self-pay | Admitting: Surgery

## 2014-08-23 NOTE — Telephone Encounter (Signed)
Per staff message and POF I have scheduled appts. Advised scheduler of appts. JMW  

## 2014-08-24 ENCOUNTER — Other Ambulatory Visit: Payer: 59

## 2014-08-26 HISTORY — PX: OOPHORECTOMY: SHX86

## 2014-08-30 ENCOUNTER — Encounter: Payer: Self-pay | Admitting: *Deleted

## 2014-08-30 ENCOUNTER — Other Ambulatory Visit: Payer: 59

## 2014-09-01 ENCOUNTER — Other Ambulatory Visit: Payer: Self-pay

## 2014-09-01 DIAGNOSIS — C50311 Malignant neoplasm of lower-inner quadrant of right female breast: Secondary | ICD-10-CM

## 2014-09-01 DIAGNOSIS — C50919 Malignant neoplasm of unspecified site of unspecified female breast: Secondary | ICD-10-CM

## 2014-09-01 NOTE — Progress Notes (Signed)
Per Jackelyn Poling at [facility] was left for the patient on 1/5 re: port placement by Dr. Lucia Gaskins.  Pt did not return her call until today.  Dr. Lucia Gaskins is out of town can't do port until 1/17.   Placed order to have port put in by IR.  Per IR - they can do it 1/11 at 130, however Tiffany is gone for the day.  Left msg for desk RN to schedule tomorrow for 1/11 130 pm. POF entered to move chemo from 1/11 to 1/12, leave lab and MD appts on 1/11.

## 2014-09-02 ENCOUNTER — Telehealth: Payer: Self-pay | Admitting: *Deleted

## 2014-09-02 ENCOUNTER — Other Ambulatory Visit: Payer: Self-pay | Admitting: *Deleted

## 2014-09-02 ENCOUNTER — Other Ambulatory Visit: Payer: Self-pay | Admitting: Radiology

## 2014-09-02 NOTE — Telephone Encounter (Signed)
Per staff message and POF II have moved appt from 1/11 to 1/12

## 2014-09-02 NOTE — Telephone Encounter (Signed)
Patient called to lett her know that port is scheduled for 1/11 at 11:30. Patient to keep lab and MD appt on Monday and chemo will be on 1/12. Patient verbalized understanding.

## 2014-09-05 ENCOUNTER — Other Ambulatory Visit: Payer: Self-pay | Admitting: Hematology and Oncology

## 2014-09-05 ENCOUNTER — Telehealth: Payer: Self-pay | Admitting: Hematology and Oncology

## 2014-09-05 ENCOUNTER — Other Ambulatory Visit: Payer: 59

## 2014-09-05 ENCOUNTER — Ambulatory Visit (HOSPITAL_COMMUNITY)
Admission: RE | Admit: 2014-09-05 | Discharge: 2014-09-05 | Disposition: A | Discharge status: Home / Self Care | Payer: 59 | Source: Ambulatory Visit | Attending: Hematology and Oncology | Admitting: Hematology and Oncology

## 2014-09-05 ENCOUNTER — Ambulatory Visit (HOSPITAL_BASED_OUTPATIENT_CLINIC_OR_DEPARTMENT_OTHER): Payer: 59 | Admitting: Hematology and Oncology

## 2014-09-05 ENCOUNTER — Other Ambulatory Visit (HOSPITAL_BASED_OUTPATIENT_CLINIC_OR_DEPARTMENT_OTHER): Payer: 59

## 2014-09-05 ENCOUNTER — Encounter (HOSPITAL_COMMUNITY): Payer: Self-pay

## 2014-09-05 ENCOUNTER — Ambulatory Visit: Payer: 59 | Admitting: Hematology and Oncology

## 2014-09-05 ENCOUNTER — Ambulatory Visit: Payer: 59

## 2014-09-05 VITALS — BP 110/68 | HR 72 | Temp 99.2°F | Resp 18 | Ht 68.0 in | Wt 191.2 lb

## 2014-09-05 DIAGNOSIS — Z452 Encounter for adjustment and management of vascular access device: Secondary | ICD-10-CM | POA: Insufficient documentation

## 2014-09-05 DIAGNOSIS — Z9011 Acquired absence of right breast and nipple: Secondary | ICD-10-CM | POA: Diagnosis not present

## 2014-09-05 DIAGNOSIS — M419 Scoliosis, unspecified: Secondary | ICD-10-CM | POA: Insufficient documentation

## 2014-09-05 DIAGNOSIS — C50311 Malignant neoplasm of lower-inner quadrant of right female breast: Secondary | ICD-10-CM

## 2014-09-05 DIAGNOSIS — C50919 Malignant neoplasm of unspecified site of unspecified female breast: Secondary | ICD-10-CM

## 2014-09-05 DIAGNOSIS — Z791 Long term (current) use of non-steroidal anti-inflammatories (NSAID): Secondary | ICD-10-CM | POA: Insufficient documentation

## 2014-09-05 DIAGNOSIS — Z79899 Other long term (current) drug therapy: Secondary | ICD-10-CM | POA: Diagnosis not present

## 2014-09-05 DIAGNOSIS — C50911 Malignant neoplasm of unspecified site of right female breast: Secondary | ICD-10-CM | POA: Diagnosis not present

## 2014-09-05 LAB — PROTIME-INR
INR: 1.07 (ref 0.00–1.49)
PROTHROMBIN TIME: 14.1 s (ref 11.6–15.2)

## 2014-09-05 LAB — CBC WITH DIFFERENTIAL/PLATELET
BASO%: 0.4 % (ref 0.0–2.0)
Basophils Absolute: 0 10*3/uL (ref 0.0–0.1)
EOS ABS: 0.2 10*3/uL (ref 0.0–0.5)
EOS%: 2 % (ref 0.0–7.0)
HCT: 35.4 % (ref 34.8–46.6)
HGB: 11.7 g/dL (ref 11.6–15.9)
LYMPH%: 21.2 % (ref 14.0–49.7)
MCH: 28.1 pg (ref 25.1–34.0)
MCHC: 33.1 g/dL (ref 31.5–36.0)
MCV: 84.9 fL (ref 79.5–101.0)
MONO#: 0.5 10*3/uL (ref 0.1–0.9)
MONO%: 6.3 % (ref 0.0–14.0)
NEUT#: 5.4 10*3/uL (ref 1.5–6.5)
NEUT%: 70.1 % (ref 38.4–76.8)
Platelets: 297 10*3/uL (ref 145–400)
RBC: 4.17 10*6/uL (ref 3.70–5.45)
RDW: 14.9 % — AB (ref 11.2–14.5)
WBC: 7.6 10*3/uL (ref 3.9–10.3)
lymph#: 1.6 10*3/uL (ref 0.9–3.3)

## 2014-09-05 LAB — COMPREHENSIVE METABOLIC PANEL (CC13)
ALBUMIN: 3.4 g/dL — AB (ref 3.5–5.0)
ALT: 9 U/L (ref 0–55)
AST: 16 U/L (ref 5–34)
Alkaline Phosphatase: 83 U/L (ref 40–150)
Anion Gap: 3 mEq/L (ref 3–11)
BUN: 6 mg/dL — ABNORMAL LOW (ref 7.0–26.0)
CHLORIDE: 106 meq/L (ref 98–109)
CO2: 29 mEq/L (ref 22–29)
Calcium: 9.3 mg/dL (ref 8.4–10.4)
Creatinine: 0.8 mg/dL (ref 0.6–1.1)
GLUCOSE: 88 mg/dL (ref 70–140)
POTASSIUM: 3.8 meq/L (ref 3.5–5.1)
Sodium: 139 mEq/L (ref 136–145)
Total Bilirubin: 0.62 mg/dL (ref 0.20–1.20)
Total Protein: 6.6 g/dL (ref 6.4–8.3)

## 2014-09-05 LAB — APTT: aPTT: 31 seconds (ref 24–37)

## 2014-09-05 MED ORDER — LIDOCAINE-PRILOCAINE 2.5-2.5 % EX CREA: TOPICAL_CREAM | CUTANEOUS | Status: DC

## 2014-09-05 MED ORDER — MIDAZOLAM HCL 2 MG/2ML IJ SOLN
INTRAMUSCULAR | Status: AC
  Filled 2014-09-05: qty 6

## 2014-09-05 MED ORDER — LIDOCAINE HCL 1 % IJ SOLN
INTRAMUSCULAR | Status: AC
  Filled 2014-09-05: qty 20

## 2014-09-05 MED ORDER — MIDAZOLAM HCL 2 MG/2ML IJ SOLN
INTRAMUSCULAR | Status: AC | PRN
  Administered 2014-09-05 (×2): 0.5 mg via INTRAVENOUS
  Administered 2014-09-05 (×2): 1 mg via INTRAVENOUS

## 2014-09-05 MED ORDER — LIDOCAINE-EPINEPHRINE 2 %-1:100000 IJ SOLN
INTRAMUSCULAR | Status: AC
  Filled 2014-09-05: qty 1

## 2014-09-05 MED ORDER — HEPARIN SOD (PORK) LOCK FLUSH 100 UNIT/ML IV SOLN
INTRAVENOUS | Status: AC | PRN
  Administered 2014-09-05: 500 [IU]

## 2014-09-05 MED ORDER — CEFAZOLIN SODIUM-DEXTROSE 2-3 GM-% IV SOLR
2.0000 g | INTRAVENOUS | Status: AC
  Administered 2014-09-05: 2 g via INTRAVENOUS

## 2014-09-05 MED ORDER — ONDANSETRON HCL 8 MG PO TABS: 8.0000 mg | ORAL_TABLET | Freq: Two times a day (BID) | ORAL | Status: DC

## 2014-09-05 MED ORDER — SODIUM CHLORIDE 0.9 % IV SOLN
INTRAVENOUS | Status: DC
  Administered 2014-09-05: 13:00:00 via INTRAVENOUS

## 2014-09-05 MED ORDER — LORAZEPAM 0.5 MG PO TABS: 0.5000 mg | ORAL_TABLET | Freq: Four times a day (QID) | ORAL | Status: DC | PRN

## 2014-09-05 MED ORDER — DEXAMETHASONE 4 MG PO TABS: 8.0000 mg | ORAL_TABLET | Freq: Two times a day (BID) | ORAL | Status: DC

## 2014-09-05 MED ORDER — PROCHLORPERAZINE MALEATE 10 MG PO TABS: 10.0000 mg | ORAL_TABLET | Freq: Four times a day (QID) | ORAL | Status: DC | PRN

## 2014-09-05 MED ORDER — CEFAZOLIN SODIUM-DEXTROSE 2-3 GM-% IV SOLR
INTRAVENOUS | Status: AC
  Filled 2014-09-05: qty 50

## 2014-09-05 MED ORDER — FENTANYL CITRATE 0.05 MG/ML IJ SOLN
INTRAMUSCULAR | Status: AC
  Filled 2014-09-05: qty 4

## 2014-09-05 MED ORDER — FENTANYL CITRATE 0.05 MG/ML IJ SOLN
INTRAMUSCULAR | Status: AC | PRN
  Administered 2014-09-05 (×3): 25 ug via INTRAVENOUS

## 2014-09-05 MED ORDER — HEPARIN SOD (PORK) LOCK FLUSH 100 UNIT/ML IV SOLN
INTRAVENOUS | Status: AC
  Filled 2014-09-05: qty 5

## 2014-09-05 NOTE — Procedures (Signed)
Successful LT IJ POWER PORT TIP SVC/RA NO COMP STABLE READY FOR USE

## 2014-09-05 NOTE — Discharge Instructions (Signed)

## 2014-09-05 NOTE — Assessment & Plan Note (Addendum)
Right breast invasive ductal carcinoma status post mastectomy 12 2015 followed by reconstruction 2 cm tumor with intermediate grade DCIS, lymphovascular invasion noted, 2 SLN negative, grade 3, T1 C. N0 M0 stage IA, Oncotype DX score 24, 16% risk of recurrence  Current treatment: Adjuvant chemotherapy with Taxotere and Cytoxan 4 cycles every 3 weeks. She will start with chemotherapy tomorrow. Given the fact that she lives in [place], I discussed her case with Dr. Whitney Muse who agreed to see her and treat her with adjuvant chemotherapy at the [facility] in [place]. Ovarian suppression with Zoladex injections I showed her that this would be more convenient to her and enable her to receive treatments closer to home. She understands this and is willing to get treated over there.  Patient will be arranged to have a follow-up with Dr. Whitney Muse tomorrow.

## 2014-09-05 NOTE — Progress Notes (Signed)
Patient Care Team: No Pcp Per Patient as PCP - General (General Practice) Alphonsa Overall, MD as Consulting Physician (General Surgery) Rulon Eisenmenger, MD as Consulting Physician (Hematology and Oncology) Blair Promise, MD as Consulting Physician (Radiation Oncology)  DIAGNOSIS: Breast cancer of lower-inner quadrant of right female breast   Staging form: Breast, AJCC 7th Edition     Clinical: Stage IA (T1b, N0, cM0) - Unsigned       Staging comments: Staged at breast conference 10.28.15      Pathologic: Stage IA (T1c, N0, cM0) - Unsigned   SUMMARY OF ONCOLOGIC HISTORY:   Breast cancer of lower-inner quadrant of right female breast   06/07/2014 Initial Biopsy Right breast needle biopsy 5:00 position: Invasive ductal carcinoma with DCIS, ER 100%, PR 71%, Ki-67 33%, HER-2 negative ratio 1.03   06/17/2014 Breast MRI Right breast: 10 x 7 x 5 mm biopsy-proven IDC with DCIS, left breast 7 x 7 x 7 mm fibroadenoma, left upper quadrant of the abdomen abutting the peritoneum 3 x 1.1 cm oval soft tissue mass   07/27/2014 Surgery Right breast mastectomy: Invasive ductal carcinoma grade 3, 2 cm, intermediate grade DCIS, lymphovascular invasion identified, 2 SLN negative, T1 C. N0 M0 stage IA, ER positive, PR 7%, HER-2 negative, Ki-67 33%    CHIEF COMPLIANT: follow-up of breast cancer. Patient to start chemotherapy 09/06/2014 port being placed today.  INTERVAL HISTORY: Lydia Stevens is a 44 year old lady with above-mentioned history of right-sided breast cancer who underwent a mastectomy and is here today to discuss the final treatment plan. She is scheduled to undergo port placement today and is supposed to start adjuvant chemotherapy with Taxotere and Cytoxan from tomorrow. She is anxious and nervous but otherwise doing well. She is recovered fairly well from the recent surgery.  REVIEW OF SYSTEMS:   Constitutional: Denies fevers, chills or abnormal weight loss Eyes: Denies blurriness of  vision Ears, nose, mouth, throat, and face: Denies mucositis or sore throat Respiratory: Denies cough, dyspnea or wheezes Cardiovascular: Denies palpitation, chest discomfort or lower extremity swelling Gastrointestinal:  Denies nausea, heartburn or change in bowel habits Skin: Denies abnormal skin rashes Lymphatics: Denies new lymphadenopathy or easy bruising Neurological:Denies numbness, tingling or new weaknesses Behavioral/Psych: Mood is stable, no new changes  Breast: recovered well from recent surgery All other systems were reviewed with the patient and are negative.  I have reviewed the past medical history, past surgical history, social history and family history with the patient and they are unchanged from previous note.  ALLERGIES:  has No Known Allergies.  MEDICATIONS:  Current Outpatient Prescriptions  Medication Sig Dispense Refill  . aspirin-acetaminophen-caffeine (EXCEDRIN MIGRAINE) 250-250-65 MG per tablet Take 1 tablet by mouth every 6 (six) hours as needed for headache.    . dexamethasone (DECADRON) 4 MG tablet Take 2 tablets (8 mg total) by mouth 2 (two) times daily. Start the day before Taxotere. Then again the day after chemo for 3 days. 30 tablet 1  . lidocaine-prilocaine (EMLA) cream Apply to affected area once 30 g 3  . LORazepam (ATIVAN) 0.5 MG tablet Take 1 tablet (0.5 mg total) by mouth every 6 (six) hours as needed (Nausea or vomiting). 30 tablet 0  . ondansetron (ZOFRAN) 8 MG tablet Take 1 tablet (8 mg total) by mouth 2 (two) times daily. Start the day after chemo for 3 days. Then take as needed for nausea or vomiting. 30 tablet 1  . oxyCODONE (OXY IR/ROXICODONE) 5 MG immediate release tablet Take  1-2 tablets (5-10 mg total) by mouth every 4 (four) hours as needed for severe pain. 50 tablet 0  . prochlorperazine (COMPAZINE) 10 MG tablet Take 1 tablet (10 mg total) by mouth every 6 (six) hours as needed (Nausea or vomiting). 30 tablet 1  . senna-docusate  (SENOKOT-S) 8.6-50 MG per tablet Take 1 tablet by mouth at bedtime as needed for mild constipation. 30 tablet 0  . traMADol (ULTRAM) 50 MG tablet Take 50 mg by mouth every 6 (six) hours as needed for moderate pain.     No current facility-administered medications for this visit.   Facility-Administered Medications Ordered in Other Visits  Medication Dose Route Frequency Provider Last Rate Last Dose  . 0.9 %  sodium chloride infusion   Intravenous Continuous D Rowe Robert, PA-C 50 mL/hr at 09/05/14 1239    . ceFAZolin (ANCEF) IVPB 2 g/50 mL premix  2 g Intravenous On Call D Kevin Allred, PA-C        PHYSICAL EXAMINATION: ECOG PERFORMANCE STATUS: 0 - Asymptomatic  Filed Vitals:   09/05/14 0932  BP: 110/68  Pulse: 72  Temp: 99.2 F (37.3 C)  Resp: 18   Filed Weights   09/05/14 0932  Weight: 191 lb 3.2 oz (86.728 kg)    GENERAL:alert, no distress and comfortable SKIN: skin color, texture, turgor are normal, no rashes or significant lesions EYES: normal, Conjunctiva are pink and non-injected, sclera clear OROPHARYNX:no exudate, no erythema and lips, buccal mucosa, and tongue normal  NECK: supple, thyroid normal size, non-tender, without nodularity LYMPH:  no palpable lymphadenopathy in the cervical, axillary or inguinal LUNGS: clear to auscultation and percussion with normal breathing effort HEART: regular rate & rhythm and no murmurs and no lower extremity edema ABDOMEN:abdomen soft, non-tender and normal bowel sounds Musculoskeletal:no cyanosis of digits and no clubbing  NEURO: alert & oriented x 3 with fluent speech, no focal motor/sensory deficits  LABORATORY DATA:  I have reviewed the data as listed   Chemistry      Component Value Date/Time   NA 139 09/05/2014 0914   NA 144 07/22/2014 1055   K 3.8 09/05/2014 0914   K 3.6* 07/22/2014 1055   CL 107 07/22/2014 1055   CO2 29 09/05/2014 0914   CO2 23 07/22/2014 1055   BUN 6.0* 09/05/2014 0914   BUN 10 07/22/2014 1055    CREATININE 0.8 09/05/2014 0914   CREATININE 0.85 07/22/2014 1055      Component Value Date/Time   CALCIUM 9.3 09/05/2014 0914   CALCIUM 9.1 07/22/2014 1055   ALKPHOS 83 09/05/2014 0914   AST 16 09/05/2014 0914   ALT 9 09/05/2014 0914   BILITOT 0.62 09/05/2014 0914       Lab Results  Component Value Date   WBC 7.6 09/05/2014   HGB 11.7 09/05/2014   HCT 35.4 09/05/2014   MCV 84.9 09/05/2014   PLT 297 09/05/2014   NEUTROABS 5.4 09/05/2014   ASSESSMENT & PLAN:  Breast cancer of lower-inner quadrant of right female breast Right breast invasive ductal carcinoma status post mastectomy 12 2015 followed by reconstruction 2 cm tumor with intermediate grade DCIS, lymphovascular invasion noted, 2 SLN negative, grade 3, T1 C. N0 M0 stage IA, Oncotype DX score 24, 16% risk of recurrence  Current treatment: Adjuvant chemotherapy with Taxotere and Cytoxan 4 cycles every 3 weeks. She will start with chemotherapy tomorrow. Given the fact that she lives in [place], I discussed her case with Dr. Whitney Muse who agreed to see her and treat  her with adjuvant chemotherapy at the [facility] in [place]. Ovarian suppression with Zoladex injections I suggested her that this would be more convenient to her and enable her to receive treatments closer to home. She understands this and is willing to get treated over there.  Patient will be arranged to have a follow-up with Dr. Whitney Muse tomorrow.   Orders Placed This Encounter  Procedures  . CBC with Differential    Standing Status: Standing     Number of Occurrences: 20     Standing Expiration Date: 09/06/2015  . Comprehensive metabolic panel    Standing Status: Standing     Number of Occurrences: 20     Standing Expiration Date: 09/06/2015  . CBC with Differential    Standing Status: Future     Number of Occurrences:      Standing Expiration Date: 09/05/2015  . Comprehensive metabolic panel (Cmet) - [facility]    Standing Status: Future      Number of Occurrences:      Standing Expiration Date: 09/05/2015   The patient has a good understanding of the overall plan. she agrees with it. She will call with any problems that may develop before her next visit here.   Rulon Eisenmenger, MD 09/05/2014 1:49 PM

## 2014-09-05 NOTE — H&P (Signed)
Chief Complaint: "I'm here for a port a catheter"  Referring Physician(s): Gudena,Vinay K  History of Present Illness: Lydia Stevens is a 44 y.o. female with history of stage IA right breast carcinoma 05/2014 (prior mastectomy with TRAM reconstruction) who presents today for port a cath placement for chemotherapy.   Past Medical History  Diagnosis Date  . Scoliosis   . Breast cancer     2015  . Anemia     before     Past Surgical History  Procedure Laterality Date  . Cesarean section  1996  . Wisdom tooth extraction    . Mastectomy w/ sentinel node biopsy Right 07/27/2014    Procedure: RIGHT MASTECTOMY WITH RIGHT AXILLARY SENTINEL LYMPH NODE BIOPSY;  Surgeon: Alphonsa Overall, MD;  Location: [facility];  Service: General;  Laterality: Right;  . Latissimus flap to breast Right 07/27/2014    Procedure: TRAM FLAP RECONSTUCTION RIGHT CHEST;  Surgeon: Irene Limbo, MD;  Location: [facility];  Service: Plastics;  Laterality: Right;    Allergies: Review of patient's allergies indicates no known allergies.  Medications: Prior to Admission medications   Medication Sig Start Date End Date Taking? Authorizing Provider  aspirin-acetaminophen-caffeine (EXCEDRIN MIGRAINE) [phone] MG per tablet Take 1 tablet by mouth every 6 (six) hours as needed for headache.   Yes Historical Provider, MD  traMADol (ULTRAM) 50 MG tablet Take 50 mg by mouth every 6 (six) hours as needed for moderate pain.   Yes Historical Provider, MD  dexamethasone (DECADRON) 4 MG tablet Take 2 tablets (8 mg total) by mouth 2 (two) times daily. Start the day before Taxotere. Then again the day after chemo for 3 days. 09/05/14   Rulon Eisenmenger, MD  lidocaine-prilocaine (EMLA) cream Apply to affected area once 09/05/14   Rulon Eisenmenger, MD  LORazepam (ATIVAN) 0.5 MG tablet Take 1 tablet (0.5 mg total) by mouth every 6 (six) hours as needed (Nausea or vomiting). 09/05/14   Rulon Eisenmenger, MD  ondansetron (ZOFRAN) 8 MG tablet  Take 1 tablet (8 mg total) by mouth 2 (two) times daily. Start the day after chemo for 3 days. Then take as needed for nausea or vomiting. 09/05/14   Rulon Eisenmenger, MD  oxyCODONE (OXY IR/ROXICODONE) 5 MG immediate release tablet Take 1-2 tablets (5-10 mg total) by mouth every 4 (four) hours as needed for severe pain. 07/30/14   Irene Limbo, MD  prochlorperazine (COMPAZINE) 10 MG tablet Take 1 tablet (10 mg total) by mouth every 6 (six) hours as needed (Nausea or vomiting). 09/05/14   Rulon Eisenmenger, MD  senna-docusate (SENOKOT-S) 8.6-50 MG per tablet Take 1 tablet by mouth at bedtime as needed for mild constipation. 07/30/14   Irene Limbo, MD    Family History  Problem Relation Age of Onset  . Breast cancer Paternal Aunt 33  . Heart attack Father   . Breast cancer Maternal Aunt 59  . Breast cancer Sister 66  . Breast cancer Maternal Aunt 54    History   Social History  . Marital Status: Married    Spouse Name: N/A    Number of Children: 1  . Years of Education: N/A   Social History Main Topics  . Smoking status: Never Smoker   . Smokeless tobacco: Never Used  . Alcohol Use: No  . Drug Use: No  . Sexual Activity: Not on file   Other Topics Concern  . Not on file   Social History Narrative  Review of Systems   Constitutional: Negative for fever and chills.  Respiratory: Negative for cough and shortness of breath.   Cardiovascular: Negative for chest pain.  Gastrointestinal: Negative for nausea, vomiting, abdominal pain and blood in stool.  Genitourinary: Negative for dysuria and hematuria.  Musculoskeletal: Negative for back pain.  Neurological: Negative for headaches.  Hematological: Does not bruise/bleed easily.    Vital Signs: LMP 08/29/2014  BP 115/68  HR 70  R 16  TEMP 98.8  O2 SATS 99% RA  Physical Exam  Constitutional: She is oriented to person, place, and time. She appears well-developed and well-nourished.  Cardiovascular: Normal rate and  regular rhythm.   Pulmonary/Chest: Effort normal and breath sounds normal.  Abdominal: Soft. Bowel sounds are normal.  Musculoskeletal: Normal range of motion. She exhibits no edema.  Neurological: She is alert and oriented to person, place, and time.    Imaging: US Aspiration  08/17/2014   CLINICAL DATA:  History of right mastectomy and TRAM procedure. Patient complains of fullness in the abdomen. Concern for postoperative seroma.  EXAM: ULTRASOUND GUIDED ABDOMINAL SEROMA ASPIRATION  Physician: Stephan Minister. Anselm Pancoast, MD  FLUOROSCOPY TIME:  None  MEDICATIONS: 1.5 mg versed, 62.5 mcg fentanyl. A radiology nurse monitored the patient for moderate sedation.  ANESTHESIA/SEDATION: Moderate sedation time: 18 min  PROCEDURE: The procedure was explained to the patient. The risks and benefits of the procedure were discussed and the patient's questions were addressed. Informed consent was obtained from the patient. The patient's abdomen was evaluated with ultrasound. Small amount of fluid identified in the anterior lower abdomen within the subcutaneous tissues. The right side of the abdomen at the level of the umbilicus was prepped and draped in sterile fashion. Skin was anesthetized with 1% lidocaine. A 19 gauge Yueh catheter was directed into the subcutaneous fluid collection with ultrasound guidance. Approximately 60 mL of red serous fluid was removed. Majority of the subcutaneous fluid was removed. Catheter was removed. Bandage placed over the puncture site.  FINDINGS: Small amount of subcutaneous fluid in the anterior abdomen. Majority of the fluid was aspirated.  COMPLICATIONS: None  IMPRESSION: Ultrasound-guided aspiration of an abdominal seroma. Approximately 60 mL of red serous fluid was removed.   Electronically Signed   By: Markus Daft M.D.   On: 08/17/2014 16:58    Labs:  CBC:  Recent Labs  06/22/14 0828 07/22/14 1055 08/17/14 1121 09/05/14 0914  WBC 8.9 7.8 8.5 7.6  HGB 12.9 12.3 12.3 11.7  HCT  39.3 36.8 37.5 35.4  PLT 290 273 307 297    COAGS:  Recent Labs  08/17/14 1121 09/05/14 1215  INR 1.10 1.07  APTT 34 31    BMP:  Recent Labs  06/22/14 0829 07/22/14 1055 09/05/14 0914  NA 141 144 139  K 3.6 3.6* 3.8  CL  --  107  --   CO2 24 23 29   GLUCOSE 98 82 88  BUN 10.4 10 6.0*  CALCIUM 9.6 9.1 9.3  CREATININE 0.9 0.85 0.8  GFRNONAA  --  83*  --   GFRAA  --  >90  --     LIVER FUNCTION TESTS:  Recent Labs  06/22/14 0829 09/05/14 0914  BILITOT 0.63 0.62  AST 10 16  ALT 14 9  ALKPHOS 97 83  PROT 7.3 6.6  ALBUMIN 3.8 3.4*    TUMOR MARKERS: No results for input(s): AFPTM, CEA, CA199, CHROMGRNA in the last 8760 hours.  Assessment and Plan: ANSHIKA PETHTEL is a 44 y.o.  female with history of stage IA right breast carcinoma 05/2014 (prior mastectomy with TRAM reconstruction) who presents today for port a cath placement for chemotherapy. Details/risks of procedure d/w pt/husband with their understanding and consent.      Signed: Autumn Messing 09/05/2014, 1:15 PM

## 2014-09-05 NOTE — Telephone Encounter (Signed)
recvd reply from [facility] was chged-per sch pt is having chemo & inj @ [facility]-scheduler not notified

## 2014-09-05 NOTE — Telephone Encounter (Signed)
per pof to sch pt inj-per Forestine Na DR Lindi Adie has to contact Dr Whitney Muse and have ok [id number] pt [facility] will call after hearing from [facility]

## 2014-09-06 ENCOUNTER — Ambulatory Visit: Payer: 59

## 2014-09-06 ENCOUNTER — Encounter (HOSPITAL_COMMUNITY): Payer: Self-pay | Admitting: *Deleted

## 2014-09-06 ENCOUNTER — Encounter (HOSPITAL_COMMUNITY): Payer: 59 | Attending: Hematology & Oncology

## 2014-09-06 ENCOUNTER — Encounter (HOSPITAL_COMMUNITY): Payer: Self-pay

## 2014-09-06 ENCOUNTER — Encounter (HOSPITAL_BASED_OUTPATIENT_CLINIC_OR_DEPARTMENT_OTHER): Payer: 59 | Admitting: Hematology & Oncology

## 2014-09-06 ENCOUNTER — Encounter: Payer: Self-pay | Admitting: *Deleted

## 2014-09-06 VITALS — BP 118/63 | HR 74 | Temp 98.4°F | Resp 18 | Wt 189.6 lb

## 2014-09-06 DIAGNOSIS — Z5111 Encounter for antineoplastic chemotherapy: Secondary | ICD-10-CM

## 2014-09-06 DIAGNOSIS — C50311 Malignant neoplasm of lower-inner quadrant of right female breast: Secondary | ICD-10-CM

## 2014-09-06 DIAGNOSIS — B370 Candidal stomatitis: Secondary | ICD-10-CM | POA: Diagnosis not present

## 2014-09-06 DIAGNOSIS — Z170 Estrogen receptor positive status [ER+]: Secondary | ICD-10-CM | POA: Insufficient documentation

## 2014-09-06 MED ORDER — ONDANSETRON HCL 40 MG/20ML IJ SOLN: 16.0000 mg | Freq: Once | INTRAMUSCULAR | Status: DC

## 2014-09-06 MED ORDER — DEXAMETHASONE SODIUM PHOSPHATE 10 MG/ML IJ SOLN: 10.0000 mg | Freq: Once | INTRAMUSCULAR | Status: DC

## 2014-09-06 MED ORDER — HEPARIN SOD (PORK) LOCK FLUSH 100 UNIT/ML IV SOLN
500.0000 [IU] | Freq: Once | INTRAVENOUS | Status: AC | PRN
  Administered 2014-09-06: 500 [IU]
  Filled 2014-09-06: qty 5

## 2014-09-06 MED ORDER — SODIUM CHLORIDE 0.9 % IV SOLN
Freq: Once | INTRAVENOUS | Status: AC
  Administered 2014-09-06: 11:00:00 via INTRAVENOUS

## 2014-09-06 MED ORDER — SODIUM CHLORIDE 0.9 % IJ SOLN: 10.0000 mL | INTRAMUSCULAR | Status: DC | PRN

## 2014-09-06 MED ORDER — DOCETAXEL CHEMO INJECTION 160 MG/16ML
75.0000 mg/m2 | Freq: Once | INTRAVENOUS | Status: AC
  Administered 2014-09-06: 150 mg via INTRAVENOUS
  Filled 2014-09-06: qty 15

## 2014-09-06 MED ORDER — SODIUM CHLORIDE 0.9 % IV SOLN
600.0000 mg/m2 | Freq: Once | INTRAVENOUS | Status: AC
  Administered 2014-09-06: 1240 mg via INTRAVENOUS
  Filled 2014-09-06: qty 62

## 2014-09-06 MED ORDER — ONDANSETRON HCL 8 MG PO TABS: 8.0000 mg | ORAL_TABLET | Freq: Three times a day (TID) | ORAL | Status: DC | PRN

## 2014-09-06 MED ORDER — SODIUM CHLORIDE 0.9 % IV SOLN
Freq: Once | INTRAVENOUS | Status: AC
Start: 2014-09-06 — End: 2014-09-06
  Administered 2014-09-06: 16 mg via INTRAVENOUS
  Filled 2014-09-06: qty 8

## 2014-09-06 MED ORDER — DEXAMETHASONE 4 MG PO TABS: ORAL_TABLET | ORAL | Status: DC

## 2014-09-06 NOTE — Patient Instructions (Signed)
[facility]  Discharge Instructions:  Please call with any problem or concerns. I will see you back next week to see how you did. _______________________________________________________________  Thank you for choosing [facility] to provide your oncology and hematology care.  To afford each patient quality time with our providers, please arrive at least 15 minutes before your scheduled appointment.  You need to re-schedule your appointment if you arrive 10 or more minutes late.  We strive to give you quality time with our providers, and arriving late affects you and other patients whose appointments are after yours.  Also, if you no show three or more times for appointments you may be dismissed from the clinic.  Again, thank you for choosing [facility] hope is that these requests will allow you access to exceptional care and in a timely manner. _______________________________________________________________  If you have questions after your visit, please contact our office at [phone] between the hours of 8:30 a.m. and 5:00 p.m. Voicemails left after 4:30 p.m. will not be returned until the following business day. _______________________________________________________________  For prescription refill requests, have your pharmacy contact our office. _______________________________________________________________  Recommendations made by the consultant and any test results will be sent to your referring physician. _______________________________________________________________  [facility] REPORT AS SOON AS POSSIBLE AFTER TREATMENT:  FEVER GREATER THAN 100.5 F  CHILLS WITH OR WITHOUT FEVER  NAUSEA AND VOMITING THAT IS NOT CONTROLLED WITH YOUR NAUSEA MEDICATION  UNUSUAL SHORTNESS OF BREATH  UNUSUAL BRUISING OR  BLEEDING  TENDERNESS IN MOUTH AND THROAT WITH OR WITHOUT PRESENCE OF ULCERS  URINARY PROBLEMS  BOWEL PROBLEMS  UNUSUAL RASH    Wear comfortable clothing and clothing appropriate for easy access to any Portacath or PICC line. Let us know if there is anything that we can do to make your therapy better!

## 2014-09-06 NOTE — Progress Notes (Signed)
Tolerated chemo well. Ambulatory at discharge home with husband.

## 2014-09-06 NOTE — Patient Instructions (Addendum)
[place]   CHEMOTHERAPY INSTRUCTIONS   [facility] [place]   CHEMOTHERAPY INSTRUCTIONS  Taxotere - bone marrow suppression (lowers white blood cells (fight infection), lowers red blood cells (make up your blood), lowers platelets (help blood to clot). This chemo can cause fluid retention. You will be responsible for taking a steroid called Dexamethasone at home prior to and after Taxotere. This steroid will keep you from having fluid retention. Take it whether you think you need it or not. Can cause hair loss, skin/nail changes (darkening of the nail beds, pain where the nail bed meets the skin, loosening of the nail beds, dry skin, palms of hands and soles of feet may darken or get sensitive, nausea/vomiting, paresthesia (numbness or tingling) in extremities - we need to know if this develops, mucositis (inflammation of any mucosal membrane - the mouth, throat), mouth sores, neurotoxicity (loss of memory, headaches, trouble sleeping, etc.), can also cause excessive tear production. Please let us know if any side effect develops.  Cytoxan is a chemotherapy drug. Cytoxan can cause nausea and vomiting and of course hair loss. It can also cause hemorrhagic cystitis (bloody urine) because the chemo irritates your bladder. You need to push fluids when on this chemo, preferably water(64 oz a day). Do not hold your urine. Urinate before you go to bed and if you wake up during the night go to the bathroom.  Neulasta - this medication is not chemo but being given because you have had chemo. It is usually given at least 24 hours after the completion of chemotherapy. This medication works by boosting your bone marrow's supply of white blood cells. White blood cells are what protect our bodies against infection. The medication is given in the form of a subcutaneous injection. It is given in the fatty tissue of your abdomen. It is a short needle. The  major side effect of this medication is bone or muscle pain. The drug of choice to relieve or lessen the pain is Aleve or Ibuprofen. If a physician has ever told you not to take Aleve or Ibuprofen - then don't take it. You should then take Tylenol/acetaminophen. Take either medication as the bottle directs you to.  The level of pain you experience as a result of this injection can range from none, to mild or moderate, or severe. Please let us know if you develop moderate or severe bone pain.     POTENTIAL SIDE EFFECTS OF TREATMENT: Increased Susceptibility to Infection, Vomiting, Constipation, Hair Thinning, Changes in Character of Skin and Nails (brittleness, dryness,etc.), Pigment Changes (darkening of nail beds, palms of hands, soles of feet, etc.), Bone Marrow Suppression, Blood in Urine, Complete Hair Loss, Nausea, Diarrhea, Painful Urination, Sun Sensitivity and Mouth Sores   SELF IMAGE NEEDS AND REFERRALS MADE: Obtain hair accessories as soon as possible (wigs, scarves, turbans,caps,etc.) and Referral to Look Good, Feel Better consultant   EDUCATIONAL MATERIALS GIVEN AND REVIEWED: Specific Instructions Sheets: Taxotere & Cytoxan, Dexamethasone, Zofran, Compazine, EMLA cream   SELF CARE ACTIVITIES WHILE ON CHEMOTHERAPY: Increase your fluid intake 48 hours prior to treatment and drink at least 2 quarts per day after treatment., No alcohol intake., No aspirin or other medications unless approved by your oncologist., Eat foods that are light and easy to digest., Eat foods at cold or room temperature., No fried, fatty, or spicy foods immediately before or after treatment., Have teeth cleaned professionally before starting treatment. Keep dentures and partial plates  clean., Use soft toothbrush and do not use mouthwashes that contain alcohol. Biotene is a good mouthwash that is available at most pharmacies or may be ordered by calling [phone]., Use warm salt water gargles (1 teaspoon salt per  1 quart warm water) before and after meals and at bedtime. Or you may rinse with 2 tablespoons of three -percent hydrogen peroxide mixed in eight ounces of water., Always use sunscreen with SPF (Sun Protection Factor) of 30 or higher., Use your nausea medication as directed to prevent nausea., Use your stool softener or laxative as directed to prevent constipation. and Use your anti-diarrheal medication as directed to stop diarrhea.  Please wash your hands for at least 30 seconds using warm soapy water. Handwashing is the #1 way to prevent the spread of germs. Stay away from sick people or people who are getting over a cold. If you develop respiratory systems such as green/yellow mucus production or productive cough or persistent cough let us know and we will see if you need an antibiotic. It is a good idea to keep a pair of gloves on when going into grocery stores/[employer] to decrease your risk of coming into contact with germs on the carts, etc. Carry alcohol hand gel with you at all times and use it frequently if out in public. All foods need to be cooked thoroughly. No raw foods. No medium or undercooked meats, eggs. If your food is cooked medium well, it does not need to be hot pink or saturated with bloody liquid at all. Vegetables and fruits need to be washed/rinsed under the faucet with a dish detergent before being consumed. You can eat raw fruits and vegetables unless we tell you otherwise but it would be best if you cooked them or bought frozen. Do not eat off of salad bars or hot bars unless you really trust the cleanliness of the restaurant. If you need dental work, please let Dr. Whitney Muse know before you go for your appointment so that we can coordinate the best possible time for you in regards to your chemo regimen. You need to also let your dentist know that you are actively taking chemo. We may need to do labs prior to your dental appointment. We also want your bowels moving at least every other  day. If this is not happening, we need to know so that we can get you on a bowel regimen to help you go.       MEDICATIONS: You have been given prescriptions for the following medications:  Dexamethasone 4mg  tablet. The day before, day of, and day after chemo take 2 tabs in am and 2 tabs in pm with food.  Zofran/Ondansetron 8mg  tablet. Take 1 tablet every 8 hours as needed for nausea/vomiting.  Compazine/Prochlorperazine 10mg  tablet. Take 1 tablet every 6 hours as needed for nausea/vomiting.  EMLA cream. Apply a quarter size amount to port site 1 hour prior to chemo. Do not rub in. Cover with plastic wrap.  Over-the-Counter Meds:  Senna - this is a mild laxative used to treat mild constipation. May take 1-8 tabs by mouth everyday as needed for mild constipation.  Milk of Magnesia - this is a laxative used to treat moderate to severe constipation. May take 2-4 tablespoons every 8 hours as needed. May increase to 8 tablespoons x 1 dose and if no bowel movement call the [facility].  Imodium - this is for diarrhea. Take 2 tabs after 1st loose stool and then 1 every 2 hours  until you go a total of 12 hours without a loose stool. Call [facility] if loose stools continue.   SYMPTOMS TO REPORT AS SOON AS POSSIBLE AFTER TREATMENT:  FEVER GREATER THAN 100.5 F  CHILLS WITH OR WITHOUT FEVER  NAUSEA AND VOMITING THAT IS NOT CONTROLLED WITH YOUR NAUSEA MEDICATION  UNUSUAL SHORTNESS OF BREATH  UNUSUAL BRUISING OR BLEEDING  TENDERNESS IN MOUTH AND THROAT WITH OR WITHOUT PRESENCE OF ULCERS  URINARY PROBLEMS  BOWEL PROBLEMS  UNUSUAL RASH    Wear comfortable clothing and clothing appropriate for easy access to any Portacath or PICC line. Let us know if there is anything that we can do to make your therapy better!      I have been informed and understand all of the instructions given to me and have received a copy. I have been instructed to call the clinic [phone] or my  family physician as soon as possible for continued medical care, if indicated. I do not have any more questions at this time but understand that I may call the [facility] or the Patient Navigator at [phone] during office hours should I have questions or need assistance in obtaining follow-up care.        Docetaxel injection What is this medicine? DOCETAXEL (doe se TAX el) is a chemotherapy drug. It targets fast dividing cells, like cancer cells, and causes these cells to die. This medicine is used to treat many types of cancers like breast cancer, certain stomach cancers, head and neck cancer, lung cancer, and prostate cancer. This medicine may be used for other purposes; ask your health care provider or pharmacist if you have questions. COMMON BRAND NAME(S): Docefrez, Taxotere What should I tell my health care provider before I take this medicine? They need to know if you have any of these conditions: -infection (especially a virus infection such as chickenpox, cold sores, or herpes) -liver disease -low blood counts, like low white cell, platelet, or red cell counts -an unusual or allergic reaction to docetaxel, polysorbate 80, other chemotherapy agents, other medicines, foods, dyes, or preservatives -pregnant or trying to get pregnant -breast-feeding How should I use this medicine? This drug is given as an infusion into a vein. It is administered in a hospital or clinic by a specially trained health care professional. Talk to your pediatrician regarding the use of this medicine in children. Special care may be needed. Overdosage: If you think you have taken too much of this medicine contact a poison control center or emergency room at once. NOTE: This medicine is only for you. Do not share this medicine with others. What if I miss a dose? It is important not to miss your dose. Call your doctor or health care professional if you are unable to keep an appointment. What may  interact with this medicine? -cyclosporine -erythromycin -ketoconazole -medicines to increase blood counts like filgrastim, pegfilgrastim, sargramostim -vaccines Talk to your doctor or health care professional before taking any of these medicines: -acetaminophen -aspirin -ibuprofen -ketoprofen -naproxen This list may not describe all possible interactions. Give your health care provider a list of all the medicines, herbs, non-prescription drugs, or dietary supplements you use. Also tell them if you smoke, drink alcohol, or use illegal drugs. Some items may interact with your medicine. What should I watch for while using this medicine? Your condition will be monitored carefully while you are receiving this medicine. You will need important blood work done while you are taking this medicine. This  drug may make you feel generally unwell. This is not uncommon, as chemotherapy can affect healthy cells as well as cancer cells. Report any side effects. Continue your course of treatment even though you feel ill unless your doctor tells you to stop. In some cases, you may be given additional medicines to help with side effects. Follow all directions for their use. Call your doctor or health care professional for advice if you get a fever, chills or sore throat, or other symptoms of a cold or flu. Do not treat yourself. This drug decreases your body's ability to fight infections. Try to avoid being around people who are sick. This medicine may increase your risk to bruise or bleed. Call your doctor or health care professional if you notice any unusual bleeding. Be careful brushing and flossing your teeth or using a toothpick because you may get an infection or bleed more easily. If you have any dental work done, tell your dentist you are receiving this medicine. Avoid taking products that contain aspirin, acetaminophen, ibuprofen, naproxen, or ketoprofen unless instructed by your doctor. These medicines  may hide a fever. This medicine contains an alcohol in the product. You may get drowsy or dizzy. Do not drive, use machinery, or do anything that needs mental alertness until you know how this medicine affects you. Do not stand or sit up quickly, especially if you are an older patient. This reduces the risk of dizzy or fainting spells. Avoid alcoholic drinks Do not become pregnant while taking this medicine. Women should inform their doctor if they wish to become pregnant or think they might be pregnant. There is a potential for serious side effects to an unborn child. Talk to your health care professional or pharmacist for more information. Do not breast-feed an infant while taking this medicine. What side effects may I notice from receiving this medicine? Side effects that you should report to your doctor or health care professional as soon as possible: -allergic reactions like skin rash, itching or hives, swelling of the face, lips, or tongue -low blood counts - This drug may decrease the number of white blood cells, red blood cells and platelets. You may be at increased risk for infections and bleeding. -signs of infection - fever or chills, cough, sore throat, pain or difficulty passing urine -signs of decreased platelets or bleeding - bruising, pinpoint red spots on the skin, black, tarry stools, nosebleeds -signs of decreased red blood cells - unusually weak or tired, fainting spells, lightheadedness -breathing problems -fast or irregular heartbeat -low blood pressure -mouth sores -nausea and vomiting -pain, swelling, redness or irritation at the injection site -pain, tingling, numbness in the hands or feet -swelling of the ankle, feet, hands -weight gain Side effects that usually do not require medical attention (report to your prescriber or health care professional if they continue or are bothersome): -bone pain -complete hair loss including hair on your head, underarms, pubic hair,  eyebrows, and eyelashes -diarrhea -excessive tearing -changes in the color of fingernails -loosening of the fingernails -nausea -muscle pain -red flush to skin -sweating -weak or tired This list may not describe all possible side effects. Call your doctor for medical advice about side effects. You may report side effects to FDA at 1-800-FDA-1088. Where should I keep my medicine? This drug is given in a hospital or clinic and will not be stored at home. NOTE: This sheet is a summary. It may not cover all possible information. If you have questions about this medicine,  talk to your doctor, pharmacist, or health care provider.  2015, Elsevier/Gold Standard. (2013-07-08 22:21:02) Cyclophosphamide injection What is this medicine? CYCLOPHOSPHAMIDE (sye kloe FOSS fa mide) is a chemotherapy drug. It slows the growth of cancer cells. This medicine is used to treat many types of cancer like lymphoma, myeloma, leukemia, breast cancer, and ovarian cancer, to name a few. This medicine may be used for other purposes; ask your health care provider or pharmacist if you have questions. COMMON BRAND NAME(S): Cytoxan, Neosar What should I tell my health care provider before I take this medicine? They need to know if you have any of these conditions: -blood disorders -history of other chemotherapy -infection -kidney disease -liver disease -recent or ongoing radiation therapy -tumors in the bone marrow -an unusual or allergic reaction to cyclophosphamide, other chemotherapy, other medicines, foods, dyes, or preservatives -pregnant or trying to get pregnant -breast-feeding How should I use this medicine? This drug is usually given as an injection into a vein or muscle or by infusion into a vein. It is administered in a hospital or clinic by a specially trained health care professional. Talk to your pediatrician regarding the use of this medicine in children. Special care may be needed. Overdosage: If  you think you have taken too much of this medicine contact a poison control center or emergency room at once. NOTE: This medicine is only for you. Do not share this medicine with others. What if I miss a dose? It is important not to miss your dose. Call your doctor or health care professional if you are unable to keep an appointment. What may interact with this medicine? This medicine may interact with the following medications: -amiodarone -amphotericin B -azathioprine -certain antiviral medicines for HIV or AIDS such as protease inhibitors (e.g., indinavir, ritonavir) and zidovudine -certain blood pressure medications such as benazepril, captopril, enalapril, fosinopril, lisinopril, moexipril, monopril, perindopril, quinapril, ramipril, trandolapril -certain cancer medications such as anthracyclines (e.g., daunorubicin, doxorubicin), busulfan, cytarabine, paclitaxel, pentostatin, tamoxifen, trastuzumab -certain diuretics such as chlorothiazide, chlorthalidone, hydrochlorothiazide, indapamide, metolazone -certain medicines that treat or prevent blood clots like warfarin -certain muscle relaxants such as succinylcholine -cyclosporine -etanercept -indomethacin -medicines to increase blood counts like filgrastim, pegfilgrastim, sargramostim -medicines used as general anesthesia -metronidazole -natalizumab This list may not describe all possible interactions. Give your health care provider a list of all the medicines, herbs, non-prescription drugs, or dietary supplements you use. Also tell them if you smoke, drink alcohol, or use illegal drugs. Some items may interact with your medicine. What should I watch for while using this medicine? Visit your doctor for checks on your progress. This drug may make you feel generally unwell. This is not uncommon, as chemotherapy can affect healthy cells as well as cancer cells. Report any side effects. Continue your course of treatment even though you feel  ill unless your doctor tells you to stop. Drink water or other fluids as directed. Urinate often, even at night. In some cases, you may be given additional medicines to help with side effects. Follow all directions for their use. Call your doctor or health care professional for advice if you get a fever, chills or sore throat, or other symptoms of a cold or flu. Do not treat yourself. This drug decreases your body's ability to fight infections. Try to avoid being around people who are sick. This medicine may increase your risk to bruise or bleed. Call your doctor or health care professional if you notice any unusual bleeding. Be careful brushing and  flossing your teeth or using a toothpick because you may get an infection or bleed more easily. If you have any dental work done, tell your dentist you are receiving this medicine. You may get drowsy or dizzy. Do not drive, use machinery, or do anything that needs mental alertness until you know how this medicine affects you. Do not become pregnant while taking this medicine or for 1 year after stopping it. Women should inform their doctor if they wish to become pregnant or think they might be pregnant. Men should not father a child while taking this medicine and for 4 months after stopping it. There is a potential for serious side effects to an unborn child. Talk to your health care professional or pharmacist for more information. Do not breast-feed an infant while taking this medicine. This medicine may interfere with the ability to have a child. This medicine has caused ovarian failure in some women. This medicine has caused reduced sperm counts in some men. You should talk with your doctor or health care professional if you are concerned about your fertility. If you are going to have surgery, tell your doctor or health care professional that you have taken this medicine. What side effects may I notice from receiving this medicine? Side effects that you  should report to your doctor or health care professional as soon as possible: -allergic reactions like skin rash, itching or hives, swelling of the face, lips, or tongue -low blood counts - this medicine may decrease the number of white blood cells, red blood cells and platelets. You may be at increased risk for infections and bleeding. -signs of infection - fever or chills, cough, sore throat, pain or difficulty passing urine -signs of decreased platelets or bleeding - bruising, pinpoint red spots on the skin, black, tarry stools, blood in the urine -signs of decreased red blood cells - unusually weak or tired, fainting spells, lightheadedness -breathing problems -dark urine -dizziness -palpitations -swelling of the ankles, feet, hands -trouble passing urine or change in the amount of urine -weight gain -yellowing of the eyes or skin Side effects that usually do not require medical attention (report to your doctor or health care professional if they continue or are bothersome): -changes in nail or skin color -hair loss -missed menstrual periods -mouth sores -nausea, vomiting This list may not describe all possible side effects. Call your doctor for medical advice about side effects. You may report side effects to FDA at 1-800-FDA-1088. Where should I keep my medicine? This drug is given in a hospital or clinic and will not be stored at home. NOTE: This sheet is a summary. It may not cover all possible information. If you have questions about this medicine, talk to your doctor, pharmacist, or health care provider.  2015, Elsevier/Gold Standard. (2012-06-26 16:22:58) Pegfilgrastim injection What is this medicine? PEGFILGRASTIM (peg fil GRA stim) is a long-acting granulocyte colony-stimulating factor that stimulates the growth of neutrophils, a type of white blood cell important in the body's fight against infection. It is used to reduce the incidence of fever and infection in patients with  certain types of cancer who are receiving chemotherapy that affects the bone marrow. This medicine may be used for other purposes; ask your health care provider or pharmacist if you have questions. COMMON BRAND NAME(S): Neulasta What should I tell my health care provider before I take this medicine? They need to know if you have any of these conditions: -latex allergy -ongoing radiation therapy -sickle cell disease -skin  reactions to acrylic adhesives (On-Body Injector only) -an unusual or allergic reaction to pegfilgrastim, filgrastim, other medicines, foods, dyes, or preservatives -pregnant or trying to get pregnant -breast-feeding How should I use this medicine? This medicine is for injection under the skin. If you get this medicine at home, you will be taught how to prepare and give the pre-filled syringe or how to use the On-body Injector. Refer to the patient Instructions for Use for detailed instructions. Use exactly as directed. Take your medicine at regular intervals. Do not take your medicine more often than directed. It is important that you put your used needles and syringes in a special sharps container. Do not put them in a trash can. If you do not have a sharps container, call your pharmacist or healthcare provider to get one. Talk to your pediatrician regarding the use of this medicine in children. Special care may be needed. Overdosage: If you think you have taken too much of this medicine contact a poison control center or emergency room at once. NOTE: This medicine is only for you. Do not share this medicine with others. What if I miss a dose? It is important not to miss your dose. Call your doctor or health care professional if you miss your dose. If you miss a dose due to an On-body Injector failure or leakage, a new dose should be administered as soon as possible using a single prefilled syringe for manual use. What may interact with this medicine? Interactions have not  been studied. Give your health care provider a list of all the medicines, herbs, non-prescription drugs, or dietary supplements you use. Also tell them if you smoke, drink alcohol, or use illegal drugs. Some items may interact with your medicine. This list may not describe all possible interactions. Give your health care provider a list of all the medicines, herbs, non-prescription drugs, or dietary supplements you use. Also tell them if you smoke, drink alcohol, or use illegal drugs. Some items may interact with your medicine. What should I watch for while using this medicine? You may need blood work done while you are taking this medicine. If you are going to need a MRI, CT scan, or other procedure, tell your doctor that you are using this medicine (On-Body Injector only). What side effects may I notice from receiving this medicine? Side effects that you should report to your doctor or health care professional as soon as possible: -allergic reactions like skin rash, itching or hives, swelling of the face, lips, or tongue -dizziness -fever -pain, redness, or irritation at site where injected -pinpoint red spots on the skin -shortness of breath or breathing problems -stomach or side pain, or pain at the shoulder -swelling -tiredness -trouble passing urine Side effects that usually do not require medical attention (report to your doctor or health care professional if they continue or are bothersome): -bone pain -muscle pain This list may not describe all possible side effects. Call your doctor for medical advice about side effects. You may report side effects to FDA at 1-800-FDA-1088. Where should I keep my medicine? Keep out of the reach of children. Store pre-filled syringes in a refrigerator between 2 and 8 degrees C (36 and 46 degrees F). Do not freeze. Keep in carton to protect from light. Throw away this medicine if it is left out of the refrigerator for more than 48 hours. Throw away  any unused medicine after the expiration date. NOTE: This sheet is a summary. It may not cover all  possible information. If you have questions about this medicine, talk to your doctor, pharmacist, or health care provider.  2015, [employer]. (2013-11-11 16:14:05) Dexamethasone tablets What is this medicine? DEXAMETHASONE (dex a METH a sone) is a corticosteroid. It is commonly used to treat inflammation of the skin, joints, lungs, and other organs. Common conditions treated include asthma, allergies, and arthritis. It is also used for other conditions, such as blood disorders and diseases of the adrenal glands. This medicine may be used for other purposes; ask your health care provider or pharmacist if you have questions. COMMON BRAND NAME(S): Decadron, DexPak Sterling Big, DexPak TaperPak, Zema-Pak What should I tell my health care provider before I take this medicine? They need to know if you have any of these conditions: -Cushing's syndrome -diabetes -glaucoma -heart problems or disease -high blood pressure -infection like herpes, measles, tuberculosis, or chickenpox -kidney disease -liver disease -mental problems -myasthenia gravis -osteoporosis -previous heart attack -seizures -stomach, ulcer or intestine disease including colitis and diverticulitis -thyroid problem -an unusual or allergic reaction to dexamethasone, corticosteroids, other medicines, lactose, foods, dyes, or preservatives -pregnant or trying to get pregnant -breast-feeding How should I use this medicine? Take this medicine by mouth with a drink of water. Follow the directions on the prescription label. Take it with food or milk to avoid stomach upset. If you are taking this medicine once a day, take it in the morning. Do not take more medicine than you are told to take. Do not suddenly stop taking your medicine because you may develop a severe reaction. Your doctor will tell you how much medicine to take.  If your doctor wants you to stop the medicine, the dose may be slowly lowered over time to avoid any side effects. Talk to your pediatrician regarding the use of this medicine in children. Special care may be needed. Patients over 47 years old may have a stronger reaction and need a smaller dose. Overdosage: If you think you have taken too much of this medicine contact a poison control center or emergency room at once. NOTE: This medicine is only for you. Do not share this medicine with others. What if I miss a dose? If you miss a dose, take it as soon as you can. If it is almost time for your next dose, talk to your doctor or health care professional. You may need to miss a dose or take an extra dose. Do not take double or extra doses without advice. What may interact with this medicine? Do not take this medicine with any of the following medications: -mifepristone, RU-486 -vaccines This medicine may also interact with the following medications: -amphotericin B -antibiotics like clarithromycin, erythromycin, and troleandomycin -aspirin and aspirin-like drugs -barbiturates like phenobarbital -carbamazepine -cholestyramine -cholinesterase inhibitors like donepezil, galantamine, rivastigmine, and tacrine -cyclosporine -digoxin -diuretics -ephedrine -female hormones, like estrogens or progestins and birth control pills -indinavir -isoniazid -ketoconazole -medicines for diabetes -medicines that improve muscle tone or strength for conditions like myasthenia gravis -NSAIDs, medicines for pain and inflammation, like ibuprofen or naproxen -phenytoin -rifampin -thalidomide -warfarin This list may not describe all possible interactions. Give your health care provider a list of all the medicines, herbs, non-prescription drugs, or dietary supplements you use. Also tell them if you smoke, drink alcohol, or use illegal drugs. Some items may interact with your medicine. What should I watch for  while using this medicine? Visit your doctor or health care professional for regular checks on your progress. If you are taking this medicine  over a prolonged period, carry an identification card with your name and address, the type and dose of your medicine, and your doctor's name and address. This medicine may increase your risk of getting an infection. Stay away from people who are sick. Tell your doctor or health care professional if you are around anyone with measles or chickenpox. If you are going to have surgery, tell your doctor or health care professional that you have taken this medicine within the last twelve months. Ask your doctor or health care professional about your diet. You may need to lower the amount of salt you eat. The medicine can increase your blood sugar. If you are a diabetic check with your doctor if you need help adjusting the dose of your diabetic medicine. What side effects may I notice from receiving this medicine? Side effects that you should report to your doctor or health care professional as soon as possible: -allergic reactions like skin rash, itching or hives, swelling of the face, lips, or tongue -changes in vision -fever, sore throat, sneezing, cough, or other signs of infection, wounds that will not heal -increased thirst -mental depression, mood swings, mistaken feelings of self importance or of being mistreated -pain in hips, back, ribs, arms, shoulders, or legs -redness, blistering, peeling or loosening of the skin, including inside the mouth -trouble passing urine or change in the amount of urine -swelling of feet or lower legs -unusual bleeding or bruising Side effects that usually do not require medical attention (report to your doctor or health care professional if they continue or are bothersome): -headache -nausea, vomiting -skin problems, acne, thin and shiny skin -weight gain This list may not describe all possible side effects. Call your  doctor for medical advice about side effects. You may report side effects to FDA at 1-800-FDA-1088. Where should I keep my medicine? Keep out of the reach of children. Store at room temperature between 20 and 25 degrees C (68 and 77 degrees F). Protect from light. Throw away any unused medicine after the expiration date. NOTE: This sheet is a summary. It may not cover all possible information. If you have questions about this medicine, talk to your doctor, pharmacist, or health care provider.  2015, [employer] Standard. (2007-12-03 14:02:13) Ondansetron tablets What is this medicine? ONDANSETRON (on DAN se tron) is used to treat nausea and vomiting caused by chemotherapy. It is also used to prevent or treat nausea and vomiting after surgery. This medicine may be used for other purposes; ask your health care provider or pharmacist if you have questions. COMMON BRAND NAME(S): Zofran What should I tell my health care provider before I take this medicine? They need to know if you have any of these conditions: -heart disease -history of irregular heartbeat -liver disease -low levels of magnesium or potassium in the blood -an unusual or allergic reaction to ondansetron, granisetron, other medicines, foods, dyes, or preservatives -pregnant or trying to get pregnant -breast-feeding How should I use this medicine? Take this medicine by mouth with a glass of water. Follow the directions on your prescription label. Take your doses at regular intervals. Do not take your medicine more often than directed. Talk to your pediatrician regarding the use of this medicine in children. Special care may be needed. Overdosage: If you think you have taken too much of this medicine contact a poison control center or emergency room at once. NOTE: This medicine is only for you. Do not share this medicine with others. What if I miss  a dose? If you miss a dose, take it as soon as you can. If it is almost time for  your next dose, take only that dose. Do not take double or extra doses. What may interact with this medicine? Do not take this medicine with any of the following medications: -apomorphine -certain medicines for fungal infections like fluconazole, itraconazole, ketoconazole, posaconazole, voriconazole -cisapride -dofetilide -dronedarone -pimozide -thioridazine -ziprasidone This medicine may also interact with the following medications: -carbamazepine -certain medicines for depression, anxiety, or psychotic disturbances -fentanyl -linezolid -MAOIs like Carbex, Eldepryl, Marplan, Nardil, and Parnate -methylene blue (injected into a vein) -other medicines that prolong the QT interval (cause an abnormal heart rhythm) -phenytoin -rifampicin -tramadol This list may not describe all possible interactions. Give your health care provider a list of all the medicines, herbs, non-prescription drugs, or dietary supplements you use. Also tell them if you smoke, drink alcohol, or use illegal drugs. Some items may interact with your medicine. What should I watch for while using this medicine? Check with your doctor or health care professional right away if you have any sign of an allergic reaction. What side effects may I notice from receiving this medicine? Side effects that you should report to your doctor or health care professional as soon as possible: -allergic reactions like skin rash, itching or hives, swelling of the face, lips or tongue -breathing problems -confusion -dizziness -fast or irregular heartbeat -feeling faint or lightheaded, falls -fever and chills -loss of balance or coordination -seizures -sweating -swelling of the hands or feet -tightness in the chest -tremors -unusually weak or tired Side effects that usually do not require medical attention (report to your doctor or health care professional if they continue or are bothersome): -constipation or  diarrhea -headache This list may not describe all possible side effects. Call your doctor for medical advice about side effects. You may report side effects to FDA at 1-800-FDA-1088. Where should I keep my medicine? Keep out of the reach of children. Store between 2 and 30 degrees C (36 and 86 degrees F). Throw away any unused medicine after the expiration date. NOTE: This sheet is a summary. It may not cover all possible information. If you have questions about this medicine, talk to your doctor, pharmacist, or health care provider.  2015, [employer] Standard. (2013-05-19 16:27:45) Prochlorperazine tablets What is this medicine? PROCHLORPERAZINE (proe klor PER a zeen) helps to control severe nausea and vomiting. This medicine is also used to treat schizophrenia. It can also help patients who experience anxiety that is not due to psychological illness. This medicine may be used for other purposes; ask your health care provider or pharmacist if you have questions. COMMON BRAND NAME(S): Compazine What should I tell my health care provider before I take this medicine? They need to know if you have any of these conditions: -blood disorders or disease -dementia -liver disease or jaundice -Parkinson's disease -uncontrollable movement disorder -an unusual or allergic reaction to prochlorperazine, other medicines, foods, dyes, or preservatives -pregnant or trying to get pregnant -breast-feeding How should I use this medicine? Take this medicine by mouth with a glass of water. Follow the directions on the prescription label. Take your doses at regular intervals. Do not take your medicine more often than directed. Do not stop taking this medicine suddenly. This can cause nausea, vomiting, and dizziness. Ask your doctor or health care professional for advice. Talk to your pediatrician regarding the use of this medicine in children. Special care may be needed. While this  drug may be prescribed for  children as young as 2 years for selected conditions, precautions do apply. Overdosage: If you think you have taken too much of this medicine contact a poison control center or emergency room at once. NOTE: This medicine is only for you. Do not share this medicine with others. What if I miss a dose? If you miss a dose, take it as soon as you can. If it is almost time for your next dose, take only that dose. Do not take double or extra doses. What may interact with this medicine? Do not take this medicine with any of the following medications: -amoxapine -antidepressants like citalopram, escitalopram, fluoxetine, paroxetine, and sertraline -deferoxamine -dofetilide -maprotiline -tricyclic antidepressants like amitriptyline, clomipramine, imipramine, nortiptyline and others This medicine may also interact with the following medications: -lithium -medicines for pain -phenytoin -propranolol -warfarin This list may not describe all possible interactions. Give your health care provider a list of all the medicines, herbs, non-prescription drugs, or dietary supplements you use. Also tell them if you smoke, drink alcohol, or use illegal drugs. Some items may interact with your medicine. What should I watch for while using this medicine? Visit your doctor or health care professional for regular checks on your progress. You may get drowsy or dizzy. Do not drive, use machinery, or do anything that needs mental alertness until you know how this medicine affects you. Do not stand or sit up quickly, especially if you are an older patient. This reduces the risk of dizzy or fainting spells. Alcohol may interfere with the effect of this medicine. Avoid alcoholic drinks. This medicine can reduce the response of your body to heat or cold. Dress warm in cold weather and stay hydrated in hot weather. If possible, avoid extreme temperatures like saunas, hot tubs, very hot or cold showers, or activities that can  cause dehydration such as vigorous exercise. This medicine can make you more sensitive to the sun. Keep out of the sun. If you cannot avoid being in the sun, wear protective clothing and use sunscreen. Do not use sun lamps or tanning beds/booths. Your mouth may get dry. Chewing sugarless gum or sucking hard candy, and drinking plenty of water may help. Contact your doctor if the problem does not go away or is severe. What side effects may I notice from receiving this medicine? Side effects that you should report to your doctor or health care professional as soon as possible: -blurred vision -breast enlargement in men or women -breast milk in women who are not breast-feeding -chest pain, fast or irregular heartbeat -confusion, restlessness -dark yellow or brown urine -difficulty breathing or swallowing -dizziness or fainting spells -drooling, shaking, movement difficulty (shuffling walk) or rigidity -fever, chills, sore throat -involuntary or uncontrollable movements of the eyes, mouth, head, arms, and legs -seizures -stomach area pain -unusually weak or tired -unusual bleeding or bruising -yellowing of skin or eyes Side effects that usually do not require medical attention (report to your doctor or health care professional if they continue or are bothersome): -difficulty passing urine -difficulty sleeping -headache -sexual dysfunction -skin rash, or itching This list may not describe all possible side effects. Call your doctor for medical advice about side effects. You may report side effects to FDA at 1-800-FDA-1088. Where should I keep my medicine? Keep out of the reach of children. Store at room temperature between 15 and 30 degrees C (59 and 86 degrees F). Protect from light. Throw away any unused medicine after the expiration date.  NOTE: This sheet is a summary. It may not cover all possible information. If you have questions about this medicine, talk to your doctor, pharmacist,  or health care provider.  2015, [employer] Standard. (2011-12-31 16:59:39) Lidocaine; Prilocaine cream What is this medicine? LIDOCAINE; PRILOCAINE (LYE doe kane; PRIL oh kane) is a topical anesthetic that causes loss of feeling in the skin and surrounding tissues. It is used to numb the skin before procedures or injections. This medicine may be used for other purposes; ask your health care provider or pharmacist if you have questions. COMMON BRAND NAME(S): EMLA What should I tell my health care provider before I take this medicine? They need to know if you have any of these conditions: -glucose-6-phosphate deficiencies -heart disease -kidney or liver disease -methemoglobinemia -an unusual or allergic reaction to lidocaine, prilocaine, other medicines, foods, dyes, or preservatives -pregnant or trying to get pregnant -breast-feeding How should I use this medicine? This medicine is for external use only on the skin. Do not take by mouth. Follow the directions on the prescription label. Wash hands before and after use. Do not use more or leave in contact with the skin longer than directed. Do not apply to eyes or open wounds. It can cause irritation and blurred or temporary loss of vision. If this medicine comes in contact with your eyes, immediately rinse the eye with water. Do not touch or rub the eye. Contact your health care provider right away. Talk to your pediatrician regarding the use of this medicine in children. While this medicine may be prescribed for children for selected conditions, precautions do apply. Overdosage: If you think you have taken too much of this medicine contact a poison control center or emergency room at once. NOTE: This medicine is only for you. Do not share this medicine with others. What if I miss a dose? This medicine is usually only applied once prior to each procedure. It must be in contact with the skin for a period of time for it to work. If you applied  this medicine later than directed, tell your health care professional before starting the procedure. What may interact with this medicine? -acetaminophen -chloroquine -dapsone -medicines to control heart rhythm -nitrates like nitroglycerin and nitroprusside -other ointments, creams, or sprays that may contain anesthetic medicine -phenobarbital -phenytoin -quinine -sulfonamides like sulfacetamide, sulfamethoxazole, sulfasalazine and others This list may not describe all possible interactions. Give your health care provider a list of all the medicines, herbs, non-prescription drugs, or dietary supplements you use. Also tell them if you smoke, drink alcohol, or use illegal drugs. Some items may interact with your medicine. What should I watch for while using this medicine? Be careful to avoid injury to the treated area while it is numb and you are not aware of pain. Avoid scratching, rubbing, or exposing the treated area to hot or cold temperatures until complete sensation has returned. The numb feeling will wear off a few hours after applying the cream. What side effects may I notice from receiving this medicine? Side effects that you should report to your doctor or health care professional as soon as possible: -blurred vision -chest pain -difficulty breathing -dizziness -drowsiness -fast or irregular heartbeat -skin rash or itching -swelling of your throat, lips, or face -trembling Side effects that usually do not require medical attention (report to your doctor or health care professional if they continue or are bothersome): -changes in ability to feel hot or cold -redness and swelling at the application site This list  may not describe all possible side effects. Call your doctor for medical advice about side effects. You may report side effects to FDA at 1-800-FDA-1088. Where should I keep my medicine? Keep out of reach of children. Store at room temperature between 15 and 30 degrees  C (59 and 86 degrees F). Keep container tightly closed. Throw away any unused medicine after the expiration date. NOTE: This sheet is a summary. It may not cover all possible information. If you have questions about this medicine, talk to your doctor, pharmacist, or health care provider.  2015, [employer] Standard. (2008-02-15 17:14:35)

## 2014-09-06 NOTE — Addendum Note (Signed)
Addended by: Gerhard Perches on: 09/06/2014 03:19 PM   Modules accepted: Orders

## 2014-09-06 NOTE — Assessment & Plan Note (Addendum)
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 She opted for mastectomy and immediate reconstruction.  She is doing well.  She is here today for initiation of adjuvant TC for 4 cycles. Additional questions were answered today prior to her therapy.  Zoladex was started and she is due to continue this monthly. Once chemotherapy is complete, she will be started on an AI, preferably aromasin.   I have advised the patient if she has any problems or concerns prior to her follow-up to please call or come in.  I will see her back next week to assess tolerance.  If she has done well, I will recommend follow-up on chemotherapy treatment days only.

## 2014-09-06 NOTE — Progress Notes (Signed)
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 N0 M0 stage IA  ER+ (100%), PR+ (71%), Her-2 negative  Core needle biopsy of L breast lesion c/w fibroadenoma  Oncotype DX score 24, 16% risk of recurrence  Monthly Zoladex  CURRENT THERAPY: To Start TC 09/06/2013        Monthly Zoladex for ovarian suppression  INTERVAL HISTORY: ALETA MANTERNACH 44 y.o. female returns for follow-up of a  Has been on depo-provera for 19 years. She rarely ever had a menstrual cycle. She did have a cycle last week. Dr. Lucia Gaskins was her surgeon.  She underwent reconstruction with a TRAM flap. She had some problems with healing under the reconstructed breast  She is planning on having a reduction on the left after chemotherapy.   MEDICAL HISTORY: Past Medical History  Diagnosis Date  . Scoliosis   . Breast cancer     2015  . Anemia     before     has Breast cancer of lower-inner quadrant of right female breast and Genetic testing on her problem list.      Breast cancer of lower-inner quadrant of right female breast   06/07/2014 Initial Biopsy Right breast needle biopsy 5:00 position: Invasive ductal carcinoma with DCIS, ER 100%, PR 71%, Ki-67 33%, HER-2 negative ratio 1.03   06/17/2014 Breast MRI Right breast: 10 x 7 x 5 mm biopsy-proven IDC with DCIS, left breast 7 x 7 x 7 mm fibroadenoma, left upper quadrant of the abdomen abutting the peritoneum 3 x 1.1 cm oval soft tissue mass   07/27/2014 Surgery Right breast mastectomy: Invasive ductal carcinoma grade 3, 2 cm, intermediate grade DCIS, lymphovascular invasion identified, 2 SLN negative, T1 C. N0 M0 stage IA, ER positive, PR 7%, HER-2 negative, Ki-67 33%   07/27/2014 Oncotype testing Recurrence Score of 24, placing  patient in the intermediate risk group   09/06/2014 -  Chemotherapy Taxotere/Cytoxan    Breast cancer risk profile:  She menarched at early age of 71  She had one pregnancy, her first child was born at age 46  She is currently on Depo Provera shots for unknown time in the [place] She was never exposed to fertility medications or hormone replacement therapy.  She has family history of Breast/GYN/GI cancer; 2 half-sisters with breast cancer    has No Known Allergies.  Ms. Tiedt does not currently have medications on file.  SURGICAL HISTORY: Past Surgical History  Procedure Laterality Date  . Cesarean section  1996  . Wisdom tooth extraction    . Mastectomy w/ sentinel node biopsy Right 07/27/2014    Procedure: RIGHT MASTECTOMY WITH RIGHT AXILLARY SENTINEL LYMPH NODE BIOPSY;  Surgeon: Alphonsa Overall, MD;  Location: [facility];  Service: General;  Laterality: Right;  . Latissimus flap to breast Right 07/27/2014    Procedure: TRAM FLAP RECONSTUCTION RIGHT CHEST;  Surgeon: Irene Limbo, MD;  Location: [facility];  Service: Plastics;  Laterality: Right;  . Portacath placement Left 09/05/2013    SOCIAL HISTORY: History   Social History  . Marital Status: Married    Spouse Name: N/A    Number of Children: 1  . Years of Education: N/A   Occupational History  . Not on file.   Social History Main Topics  .  Smoking status: Never Smoker   . Smokeless tobacco: Never Used  . Alcohol Use: No  . Drug Use: No  . Sexual Activity: Not on file   Other Topics Concern  . Not on file   Social History Narrative    FAMILY HISTORY: Family History  Problem Relation Age of Onset  . Breast cancer Paternal Aunt 37  . Heart attack Father   . Breast cancer Maternal Aunt 55  . Breast cancer Sister 52  . Breast cancer Maternal Aunt 54    Review of Systems  Constitutional: Negative for fever, chills, weight loss and malaise/fatigue.  HENT: Negative for congestion, hearing loss,  nosebleeds, sore throat and tinnitus.   Eyes: Negative for blurred vision, double vision, pain and discharge.  Respiratory: Negative for cough, hemoptysis, sputum production, shortness of breath and wheezing.   Cardiovascular: Negative for chest pain, palpitations, claudication, leg swelling and PND.  Gastrointestinal: Negative for heartburn, nausea, vomiting, abdominal pain, diarrhea, constipation, blood in stool and melena.  Genitourinary: Negative for dysuria, urgency, frequency and hematuria.  Musculoskeletal: Negative for myalgias, joint pain and falls.  Skin: Negative for itching and rash.  Neurological: Negative for dizziness, tingling, tremors, sensory change, speech change, focal weakness, seizures, loss of consciousness, weakness and headaches.  Endo/Heme/Allergies: Does not bruise/bleed easily.  Psychiatric/Behavioral: Negative for depression, suicidal ideas, memory loss and substance abuse. The patient is not nervous/anxious and does not have insomnia.     PHYSICAL EXAMINATION  ECOG PERFORMANCE STATUS: 0 - Asymptomatic  There were no vitals filed for this visit.  Physical Exam  Constitutional: She is oriented to person, place, and time and well-developed, well-nourished, and in no distress.  HENT:  Head: Normocephalic and atraumatic.  Nose: Nose normal.  Mouth/Throat: Oropharynx is clear and moist. No oropharyngeal exudate.  Eyes: Conjunctivae and EOM are normal. Pupils are equal, round, and reactive to light. Right eye exhibits no discharge. Left eye exhibits no discharge. No scleral icterus.  Neck: Normal range of motion. Neck supple. No tracheal deviation present. No thyromegaly present.  Cardiovascular: Normal rate, regular rhythm and normal heart sounds.  Exam reveals no gallop and no friction rub.   No murmur heard. Pulmonary/Chest: Effort normal and breath sounds normal. She has no wheezes. She has no rales.  Breast reconstruction intact, well healed  Abdominal:  Soft. Bowel sounds are normal. She exhibits no distension and no mass. There is no tenderness. There is no rebound and no guarding.  Well headling surgical incision site  Musculoskeletal: Normal range of motion. She exhibits no edema.  Lymphadenopathy:    She has no cervical adenopathy.  Neurological: She is alert and oriented to person, place, and time. She has normal reflexes. No cranial nerve deficit. Gait normal. Coordination normal.  Skin: Skin is warm and dry. No rash noted.  Psychiatric: Mood, memory, affect and judgment normal.  Nursing note and vitals reviewed.   LABORATORY DATA:  CBC    Component Value Date/Time   WBC 7.6 09/05/2014 0914   WBC 8.5 08/17/2014 1121   RBC 4.17 09/05/2014 0914   RBC 4.24 08/17/2014 1121   HGB 11.7 09/05/2014 0914   HGB 12.3 08/17/2014 1121   HCT 35.4 09/05/2014 0914   HCT 37.5 08/17/2014 1121   PLT 297 09/05/2014 0914   PLT 307 08/17/2014 1121   MCV 84.9 09/05/2014 0914   MCV 88.4 08/17/2014 1121   MCH 28.1 09/05/2014 0914   MCH 29.0 08/17/2014 1121   MCHC 33.1 09/05/2014 0914  MCHC 32.8 08/17/2014 1121   RDW 14.9* 09/05/2014 0914   RDW 15.5 08/17/2014 1121   LYMPHSABS 1.6 09/05/2014 0914   MONOABS 0.5 09/05/2014 0914   EOSABS 0.2 09/05/2014 0914   BASOSABS 0.0 09/05/2014 0914   CMP     Component Value Date/Time   NA 139 09/05/2014 0914   NA 144 07/22/2014 1055   K 3.8 09/05/2014 0914   K 3.6* 07/22/2014 1055   CL 107 07/22/2014 1055   CO2 29 09/05/2014 0914   CO2 23 07/22/2014 1055   GLUCOSE 88 09/05/2014 0914   GLUCOSE 82 07/22/2014 1055   BUN 6.0* 09/05/2014 0914   BUN 10 07/22/2014 1055   CREATININE 0.8 09/05/2014 0914   CREATININE 0.85 07/22/2014 1055   CALCIUM 9.3 09/05/2014 0914   CALCIUM 9.1 07/22/2014 1055   PROT 6.6 09/05/2014 0914   ALBUMIN 3.4* 09/05/2014 0914   AST 16 09/05/2014 0914   ALT 9 09/05/2014 0914   ALKPHOS 83 09/05/2014 0914   BILITOT 0.62 09/05/2014 0914   GFRNONAA 83* 07/22/2014 1055    GFRAA >90 07/22/2014 1055     PENDING LABS:   RADIOGRAPHIC STUDIES:  07/06/2014 EXAM: CT CHEST, ABDOMEN, AND PELVIS WITH CONTRAST  IMPRESSION: CT CHEST IMPRESSION  No acute process or evidence of metastatic disease in the chest.  CT ABDOMEN AND PELVIS IMPRESSION  1. Isolated left upper quadrant "Mass" which is indeterminate. Given its density similarity to the spleen, splenule is suspected. Atypical isolated appearance of omental/peritoneal metastasis felt less likely. Consider confirmation of splenule with nuclear medicine damaged right blood cell or liver/ spleen MAA scan. 2. Otherwise, no evidence of metastatic disease within the abdomen or pelvis. 3. Small volume cul-de-sac fluid which could be physiologic. Given slight increased density within, question recent cyst or follicle rupture.   Electronically Signed  By: Abigail Miyamoto M.D.  On: 07/06/2014 13:29    ASSESSMENT and THERAPY PLAN:    Breast cancer of lower-inner quadrant of right female breast Pleasant 44 year old female with newly diagnosed Stage IA ER+ PR+ Her-2 neu negative carcinoma of the R breast. She opted for mastectomy and immediate reconstruction.  She is doing well.  She is here today for initiation of adjuvant TC for 4 cycles. Additional questions were answered today prior to her therapy.  Zoladex was started and she is due to continue this monthly. Once chemotherapy is complete, she will be started on an AI, preferably aromasin.   I have advised the patient if she has any problems or concerns prior to her follow-up to please call or come in.  I will see her back next week to assess tolerance.  If she has done well, I will recommend follow-up on chemotherapy treatment days only.     All questions were answered. The patient knows to call the clinic with any problems, questions or concerns. We can certainly see the patient much sooner if necessary. Molli Hazard 09/15/2014

## 2014-09-06 NOTE — Progress Notes (Signed)
Consent for Taxotere & Cytoxan obtained. Chemo calendar given to patient. Pt has post medications already.

## 2014-09-06 NOTE — Progress Notes (Signed)
[place] Clinical Social Work  Clinical Social Work was referred by patient navigator for assessment of psychosocial needs due to new pt, first chemo today.  Clinical Social Worker met with patient at [facility] during treatment to offer support and assess for needs.  CSW introduced self and explained role of CSW. CSW discussed resources to assist and common emotions experienced when going through treatment. Pt shared she had been teary at times. CSW reviewed Ch 14 of [place] with pt that explores emotions when coping through breast cancer treatment. CSW also discussed financial resources to assist. Currently, they do not qualify for assistance but may in the future while pt is not working. CSW provided pt with other handouts as well. Pt has great support from her husband [name]. She has a 37yo son, [name] that is a sophomore at [employer]. Pt aware and agrees to reach out to CSW as needed.    Clinical Social Work interventions: Resource education Emotional support  Loren Racer, [place] Tuesdays 8:30-1pm Wednesdays 8:30-12pm  [phone]

## 2014-09-07 ENCOUNTER — Telehealth (HOSPITAL_COMMUNITY): Payer: Self-pay | Admitting: *Deleted

## 2014-09-07 ENCOUNTER — Encounter: Payer: Self-pay | Admitting: *Deleted

## 2014-09-07 LAB — VITAMIN D 25 HYDROXY (VIT D DEFICIENCY, FRACTURES): Vit D, 25-Hydroxy: 7.2 ng/mL — ABNORMAL LOW (ref 30.0–100.0)

## 2014-09-07 NOTE — Progress Notes (Signed)
[place] Psychosocial Distress Screening Clinical Social Work  Clinical Social Work was referred by distress screening protocol.  The patient scored a 7 on the Psychosocial Distress Thermometer which indicates severe distress. Clinical Social Worker met with pt at length on 09/06/14 to assess for distress and other psychosocial needs.  We discussed insurance concerns as well. Pt currently is on STD through her employer, [employer]. CSW reviewed financial resource options as well. Pt currently makes too much for most financial resources to assist, however this may change as her income decreases. She is aware of how to cotact CSW as needed.   Progress Notes   Lydia Stevens (MR# [id number])      Progress Notes Info    Author Note Status Last Update User Last Update Date/Time   Lydia Peaks, LCSW Signed Lydia Stevens [place], [place] 09/06/2014 1:26 PM    Progress Notes    Expand All Collapse All   [facility] Clinical Social Work  Clinical Social Work was referred by patient navigator for assessment of psychosocial needs due to new pt, first chemo today. Clinical Social Worker met with patient at [facility] during treatment to offer support and assess for needs. CSW introduced self and explained role of CSW. CSW discussed resources to assist and common emotions experienced when going through treatment. Pt shared she had been teary at times. CSW reviewed Ch 14 of [place] with pt that explores emotions when coping through breast cancer treatment. CSW also discussed financial resources to assist. Currently, they do not qualify for assistance but may in the future while pt is not working. CSW provided pt with other handouts as well. Pt has great support from her husband Lydia Stevens. She has a 11yo son, Lydia Stevens that is a sophomore at [employer]. Pt aware and agrees to reach out to CSW as needed.    Clinical Social Work interventions: Resource education Emotional support  Lydia Stevens, Lydia Stevens 8:30-1pm Wednesdays 8:30-12pm  [phone]            [facility] DISTRESS SCREENING 09/06/2014  Screening Type   Distress experienced in past week (1-10) 7  Practical problem type Insurance;Work/school  Family Problem type   Emotional problem type Nervousness/Anxiety;Adjusting to appearance changes  Physical Problem type Pain;Skin dry/itchy  Physician notified of physical symptoms   Referral to clinical social work   Referral to support programs   Other (No Data)    Clinical Social Worker follow up needed: No.  If yes, follow up plan:  Lydia Stevens, [place]  [facility] Phone: [phone] Fax: [phone]

## 2014-09-07 NOTE — Telephone Encounter (Signed)
24h followup: Patient doing ok today. Pt wasn't sure if she experienced nausea today or not but she went ahead and took a nausea pill and that took care of her nausea. She is doing ok other than that. Patient reminded to call us if there were any questions.

## 2014-09-08 ENCOUNTER — Other Ambulatory Visit (HOSPITAL_COMMUNITY): Payer: Self-pay

## 2014-09-08 ENCOUNTER — Ambulatory Visit: Payer: 59

## 2014-09-08 ENCOUNTER — Encounter (HOSPITAL_BASED_OUTPATIENT_CLINIC_OR_DEPARTMENT_OTHER): Payer: 59

## 2014-09-08 DIAGNOSIS — C50311 Malignant neoplasm of lower-inner quadrant of right female breast: Secondary | ICD-10-CM

## 2014-09-08 DIAGNOSIS — Z5189 Encounter for other specified aftercare: Secondary | ICD-10-CM

## 2014-09-08 DIAGNOSIS — E559 Vitamin D deficiency, unspecified: Secondary | ICD-10-CM

## 2014-09-08 MED ORDER — PEGFILGRASTIM INJECTION 6 MG/0.6ML ~~LOC~~
PREFILLED_SYRINGE | SUBCUTANEOUS | Status: AC
  Filled 2014-09-08: qty 0.6

## 2014-09-08 MED ORDER — ERGOCALCIFEROL 1.25 MG (50000 UT) PO CAPS: 50000.0000 [IU] | ORAL_CAPSULE | ORAL | Status: DC

## 2014-09-08 MED ORDER — PEGFILGRASTIM INJECTION 6 MG/0.6ML ~~LOC~~
6.0000 mg | PREFILLED_SYRINGE | Freq: Once | SUBCUTANEOUS | Status: AC
  Administered 2014-09-08: 6 mg via SUBCUTANEOUS

## 2014-09-08 NOTE — Progress Notes (Signed)
Lydia Stevens presents today for injection per MD orders. Neulasta 6mg  administered SQ in left Upper Arm. Administration without incident. Patient tolerated well.

## 2014-09-11 NOTE — Progress Notes (Signed)
No PCP Per Patient No address on file  Breast cancer of lower-inner quadrant of right female breast - Plan: heparin lock flush 100 unit/mL, sodium chloride 0.9 % injection 10 mL  Oral candidiasis - Plan: Diphenhyd-Hydrocort-Nystatin (FIRST-DUKES MOUTHWASH) SUSP  CURRENT THERAPY: S/P cycle 1 of Cytoxan/Taxotere  INTERVAL HISTORY: Lydia Stevens 44 y.o. female returns for followup of Stage IA invasive ductal carcinoma (T1C, N0, M0) of right breast, grade 3, with intermediate grade DCIS, +LVI noted with 2 SLN negative for metastatic disease, ER+ (100%), PR+ (71%), Her-2 negative and an Oncotype DX score 24, 16% risk of recurrence.  Initially seen by Dr. Lindi Stevens in [place].    Breast cancer of lower-inner quadrant of right female breast   06/07/2014 Initial Biopsy Right breast needle biopsy 5:00 position: Invasive ductal carcinoma with DCIS, ER 100%, PR 71%, Ki-67 33%, HER-2 negative ratio 1.03   06/17/2014 Breast MRI Right breast: 10 x 7 x 5 mm biopsy-proven IDC with DCIS, left breast 7 x 7 x 7 mm fibroadenoma, left upper quadrant of the abdomen abutting the peritoneum 3 x 1.1 cm oval soft tissue mass   07/27/2014 Surgery Right breast mastectomy: Invasive ductal carcinoma grade 3, 2 cm, intermediate grade DCIS, lymphovascular invasion identified, 2 SLN negative, T1 C. N0 M0 stage IA, ER positive, PR 7%, HER-2 negative, Ki-67 33%   07/27/2014 Oncotype testing Recurrence Score of 24, placing patient in the intermediate risk group   09/06/2014 -  Chemotherapy Taxotere/Cytoxan    09/08/2014 Adverse Reaction Nasuea and voming three times x 2 days.  Added Aloxi and Emend to anti-emetic regimen.   I personally reviewed and went over laboratory results with the patient.  The results are noted within this dictation.  This serves as a 1 week follow-up appointment to evaluate for any side effects of toxicities associated with treatment.  She notes a 2 day history of nausea and vomiting on  3 occassions beginning on Thursday after chemotherapy.  She notes that it is resolved now.  She thinks her home antiemetics are effective.  She notes a change in her taste of foods causing a metallic taste.  She is given helpful hints on foods to favor and foods to avoid.  I have discouraged forcing foods at this time.  She notes that she has found some foods that are pleasing to her and she will favor them for the time being.    She notes a 2 day history of fatigue leading to resting much each day.    She notes that her throat feels full.  She denies any sore throat.  On exam, she demonstrates a small white plaque on buccal mucosa and therefore I will treat with Magic Mouthwash.  Her abdomen is still painful and therefore we will not administer Zoladex at this time.  Oncologically, she otherwise denies any complaints and ROS questioning is negative.  Past Medical History  Diagnosis Date  . Scoliosis   . Breast cancer     2015  . Anemia     before     has Breast cancer of lower-inner quadrant of right female breast; Genetic testing; and Oral candidiasis on her problem list.     has No Known Allergies.  [name] does not currently have medications on file.  Past Surgical History  Procedure Laterality Date  . Cesarean section  1996  . Wisdom tooth extraction    . Mastectomy w/ sentinel node biopsy Right 07/27/2014  Procedure: RIGHT MASTECTOMY WITH RIGHT AXILLARY SENTINEL LYMPH NODE BIOPSY;  Surgeon: Lydia Overall, MD;  Location: [place];  Service: General;  Laterality: Right;  . Latissimus flap to breast Right 07/27/2014    Procedure: TRAM FLAP RECONSTUCTION RIGHT CHEST;  Surgeon: Lydia Limbo, MD;  Location: [facility];  Service: Plastics;  Laterality: Right;  . Portacath placement Left 09/05/2013    Denies any headaches, dizziness, double vision, fevers, chills, night sweats, nausea, vomiting, diarrhea, constipation, chest pain, heart palpitations, shortness of breath, blood in  stool, black tarry stool, urinary pain, urinary burning, urinary frequency, hematuria.   PHYSICAL EXAMINATION  ECOG PERFORMANCE STATUS: 0 - Asymptomatic  Filed Vitals:   09/15/14 0900  BP: 110/6  Pulse: 100  Temp: 98.5 F (36.9 C)  Resp: 16    GENERAL:alert, healthy, no distress, well nourished, well developed, comfortable, cooperative and smiling SKIN: skin color, texture, turgor are normal, no rashes or significant lesions HEAD: Normocephalic, No masses, lesions, tenderness or abnormalities EYES: normal, PERRLA, EOMI, Conjunctiva are pink and non-injected EARS: External ears normal OROPHARYNX:lips, buccal mucosa, and tongue normal, mucous membranes are moist and thrush  NECK: supple, no adenopathy, thyroid normal size, non-tender, without nodularity, no stridor, non-tender, trachea midline LYMPH:  no palpable lymphadenopathy BREAST:left breast normal without mass, skin or nipple changes or axillary nodes, right breast reconstruction noted. LUNGS: clear to auscultation and percussion HEART: regular rate & rhythm, no murmurs, no gallops, S1 normal and S2 normal ABDOMEN:abdomen soft, non-tender and normal bowel sounds BACK: abdominal  EXTREMITIES:less then 2 second capillary refill, no joint deformities, effusion, or inflammation, no skin discoloration, no cyanosis  NEURO: alert & oriented x 3 with fluent speech, no focal motor/sensory deficits, gait normal    LABORATORY DATA: CBC    Component Value Date/Time   WBC 7.6 09/05/2014 0914   WBC 8.5 08/17/2014 1121   RBC 4.17 09/05/2014 0914   RBC 4.24 08/17/2014 1121   HGB 11.7 09/05/2014 0914   HGB 12.3 08/17/2014 1121   HCT 35.4 09/05/2014 0914   HCT 37.5 08/17/2014 1121   PLT 297 09/05/2014 0914   PLT 307 08/17/2014 1121   MCV 84.9 09/05/2014 0914   MCV 88.4 08/17/2014 1121   MCH 28.1 09/05/2014 0914   MCH 29.0 08/17/2014 1121   MCHC 33.1 09/05/2014 0914   MCHC 32.8 08/17/2014 1121   RDW 14.9* 09/05/2014 0914   RDW  15.5 08/17/2014 1121   LYMPHSABS 1.6 09/05/2014 0914   MONOABS 0.5 09/05/2014 0914   EOSABS 0.2 09/05/2014 0914   BASOSABS 0.0 09/05/2014 0914      Chemistry      Component Value Date/Time   NA 139 09/05/2014 0914   NA 144 07/22/2014 1055   K 3.8 09/05/2014 0914   K 3.6* 07/22/2014 1055   CL 107 07/22/2014 1055   CO2 29 09/05/2014 0914   CO2 23 07/22/2014 1055   BUN 6.0* 09/05/2014 0914   BUN 10 07/22/2014 1055   CREATININE 0.8 09/05/2014 0914   CREATININE 0.85 07/22/2014 1055      Component Value Date/Time   CALCIUM 9.3 09/05/2014 0914   CALCIUM 9.1 07/22/2014 1055   ALKPHOS 83 09/05/2014 0914   AST 16 09/05/2014 0914   ALT 9 09/05/2014 0914   BILITOT 0.62 09/05/2014 0914         RADIOGRAPHIC STUDIES:  US Aspiration  08/17/2014   CLINICAL DATA:  History of right mastectomy and TRAM procedure. Patient complains of fullness in the abdomen. Concern for postoperative  seroma.  EXAM: ULTRASOUND GUIDED ABDOMINAL SEROMA ASPIRATION  Physician: [place] Minister. Anselm Pancoast, MD  FLUOROSCOPY TIME:  None  MEDICATIONS: 1.5 mg versed, 62.5 mcg fentanyl. A radiology nurse monitored the patient for moderate sedation.  ANESTHESIA/SEDATION: Moderate sedation time: 18 min  PROCEDURE: The procedure was explained to the patient. The risks and benefits of the procedure were discussed and the patient's questions were addressed. Informed consent was obtained from the patient. The patient's abdomen was evaluated with ultrasound. Small amount of fluid identified in the anterior lower abdomen within the subcutaneous tissues. The right side of the abdomen at the level of the umbilicus was prepped and draped in sterile fashion. Skin was anesthetized with 1% lidocaine. A 19 gauge Yueh catheter was directed into the subcutaneous fluid collection with ultrasound guidance. Approximately 60 mL of red serous fluid was removed. Majority of the subcutaneous fluid was removed. Catheter was removed. Bandage placed over the puncture  site.  FINDINGS: Small amount of subcutaneous fluid in the anterior abdomen. Majority of the fluid was aspirated.  COMPLICATIONS: None  IMPRESSION: Ultrasound-guided aspiration of an abdominal seroma. Approximately 60 mL of red serous fluid was removed.   Electronically Signed   By: Markus Daft M.D.   On: 08/17/2014 16:58   Ir Fluoro Guide Cv Line Left  09/05/2014   CLINICAL DATA:  Right breast carcinoma, access for chemo  EXAM: LEFT INTERNAL JUGULAR SINGLE LUMEN POWER PORT CATHETER INSERTION  Date:  1/11/20161/06/2015 3:35 pm  Radiologist:  M. Daryll Brod, MD  Guidance:  Ultrasound and fluoroscopic  FLUOROSCOPY TIME:  3 min 24 seconds  MEDICATIONS AND MEDICAL HISTORY: 2 g Ancefadministered within 1 hour of the procedure.3 mg Versed, 75 mcg fentanyl  ANESTHESIA/SEDATION: Forty-two  CONTRAST:  None  COMPLICATIONS: None immediate  PROCEDURE: Informed consent was obtained from the patient following explanation of the procedure, risks, benefits and alternatives. The patient understands, agrees and consents for the procedure. All questions were addressed. A time out was performed.  Maximal barrier sterile technique utilized including caps, mask, sterile gowns, sterile gloves, large sterile drape, hand hygiene, and 2% chlorhexidine scrub.  Under sterile conditions and local anesthesia, right internal jugular micropuncture venous access was performed. Access was performed with ultrasound. Images were obtained for documentation. A guide wire was inserted followed by a transitional dilator. This allowed insertion of a guide wire and catheter into the IVC. Measurements were obtained from the SVC / RA junction back to the right IJ venotomy site. In the right infraclavicular chest, a subcutaneous pocket was created over the second anterior rib. This was done under sterile conditions and local anesthesia. 1% lidocaine with epinephrine was utilized for this. A 2.5 cm incision was made in the skin. Blunt dissection was  performed to create a subcutaneous pocket over the right pectoralis major muscle. The pocket was flushed with saline vigorously. There was adequate hemostasis. The port catheter was assembled and checked for leakage. The port catheter was secured in the pocket with two retention sutures. The tubing was tunneled subcutaneously to the right venotomy site and inserted into the SVC/RA junction through a valved peel-away sheath. Position was confirmed with fluoroscopy. Images were obtained for documentation. The patient tolerated the procedure well. No immediate complications. Incisions were closed in a two layer fashion with 4 - 0 Vicryl suture. Dermabond was applied to the skin. The port catheter was accessed, blood was aspirated followed by saline and heparin flushes. Needle was removed. A dry sterile dressing was applied.  IMPRESSION: Ultrasound and  fluoroscopically guided right internal jugular single lumen power port catheter insertion. Tip in the SVC/RA junction. Catheter ready for use.   Electronically Signed   By: Daryll Brod M.D.   On: 09/05/2014 17:00   Ir US Guide Vasc Access Left  09/05/2014   CLINICAL DATA:  Right breast carcinoma, access for chemo  EXAM: LEFT INTERNAL JUGULAR SINGLE LUMEN POWER PORT CATHETER INSERTION  Date:  1/11/20161/06/2015 3:35 pm  Radiologist:  M. Daryll Brod, MD  Guidance:  Ultrasound and fluoroscopic  FLUOROSCOPY TIME:  3 min 24 seconds  MEDICATIONS AND MEDICAL HISTORY: 2 g Ancefadministered within 1 hour of the procedure.3 mg Versed, 75 mcg fentanyl  ANESTHESIA/SEDATION: Forty-two  CONTRAST:  None  COMPLICATIONS: None immediate  PROCEDURE: Informed consent was obtained from the patient following explanation of the procedure, risks, benefits and alternatives. The patient understands, agrees and consents for the procedure. All questions were addressed. A time out was performed.  Maximal barrier sterile technique utilized including caps, mask, sterile gowns, sterile gloves,  large sterile drape, hand hygiene, and 2% chlorhexidine scrub.  Under sterile conditions and local anesthesia, right internal jugular micropuncture venous access was performed. Access was performed with ultrasound. Images were obtained for documentation. A guide wire was inserted followed by a transitional dilator. This allowed insertion of a guide wire and catheter into the IVC. Measurements were obtained from the SVC / RA junction back to the right IJ venotomy site. In the right infraclavicular chest, a subcutaneous pocket was created over the second anterior rib. This was done under sterile conditions and local anesthesia. 1% lidocaine with epinephrine was utilized for this. A 2.5 cm incision was made in the skin. Blunt dissection was performed to create a subcutaneous pocket over the right pectoralis major muscle. The pocket was flushed with saline vigorously. There was adequate hemostasis. The port catheter was assembled and checked for leakage. The port catheter was secured in the pocket with two retention sutures. The tubing was tunneled subcutaneously to the right venotomy site and inserted into the SVC/RA junction through a valved peel-away sheath. Position was confirmed with fluoroscopy. Images were obtained for documentation. The patient tolerated the procedure well. No immediate complications. Incisions were closed in a two layer fashion with 4 - 0 Vicryl suture. Dermabond was applied to the skin. The port catheter was accessed, blood was aspirated followed by saline and heparin flushes. Needle was removed. A dry sterile dressing was applied.  IMPRESSION: Ultrasound and fluoroscopically guided right internal jugular single lumen power port catheter insertion. Tip in the SVC/RA junction. Catheter ready for use.   Electronically Signed   By: Daryll Brod M.D.   On: 09/05/2014 17:00      ASSESSMENT AND PLAN:  Breast cancer of lower-inner quadrant of right female breast Stage IA invasive ductal  carcinoma (T1C, N0, M0) of right breast, grade 3, with intermediate grade DCIS, +LVI noted with 2 SLN negative for metastatic disease, ER+ (100%), PR+ (71%), Her-2 negative and an Oncotype DX score 24, 16% risk of recurrence.  Initially seen by Dr. Lindi Stevens in [place].  She will move on to cycle 2 of therapy as planned with pre-chemo labs.  She notes some nausea and vomiting x 2 days post-chemotherapy and therefore, antiemetics will be changed to Aloxi and Emend.  She also notes a 2 day history of fatigue, which now completely resolved.  Return in 1 week for follow-up.     Oral candidiasis Patient notes throat fullness.  Minimal buccal mucosa white plaque.  Will treat with Magic Mouthwash QID PRN.  Rx 300 mL with 1 refill.    THERAPY PLAN:  She will move on to cycle 2 of therapy as planned.  All questions were answered. The patient knows to call the clinic with any problems, questions or concerns. We can certainly see the patient much sooner if necessary.  Patient and plan discussed with Dr. Ancil Linsey and she is in agreement with the aforementioned.   Lydia Stevens 09/15/2014

## 2014-09-11 NOTE — Assessment & Plan Note (Addendum)
Stage IA invasive ductal carcinoma (T1C, N0, M0) of right breast, grade 3, with intermediate grade DCIS, +LVI noted with 2 SLN negative for metastatic disease, ER+ (100%), PR+ (71%), Her-2 negative and an Oncotype DX score 24, 16% risk of recurrence.  Initially seen by Dr. Lindi Adie in [place].  She will move on to cycle 2 of therapy as planned with pre-chemo labs.  She notes some nausea and vomiting x 2 days post-chemotherapy and therefore, antiemetics will be changed to Aloxi and Emend.  She also notes a 2 day history of fatigue, which now completely resolved.  Return in 1 week for follow-up.

## 2014-09-12 ENCOUNTER — Other Ambulatory Visit (HOSPITAL_COMMUNITY): Payer: Self-pay | Admitting: *Deleted

## 2014-09-12 ENCOUNTER — Telehealth (HOSPITAL_COMMUNITY): Payer: Self-pay | Admitting: *Deleted

## 2014-09-12 ENCOUNTER — Other Ambulatory Visit (HOSPITAL_COMMUNITY): Payer: Self-pay | Admitting: Hematology & Oncology

## 2014-09-12 MED ORDER — TRAMADOL HCL 50 MG PO TABS: 50.0000 mg | ORAL_TABLET | Freq: Four times a day (QID) | ORAL | Status: DC | PRN

## 2014-09-13 ENCOUNTER — Ambulatory Visit: Payer: 59 | Admitting: Hematology and Oncology

## 2014-09-13 ENCOUNTER — Other Ambulatory Visit: Payer: 59

## 2014-09-15 ENCOUNTER — Encounter (HOSPITAL_COMMUNITY): Payer: Self-pay | Admitting: Hematology & Oncology

## 2014-09-15 ENCOUNTER — Encounter (HOSPITAL_BASED_OUTPATIENT_CLINIC_OR_DEPARTMENT_OTHER): Payer: 59 | Admitting: Oncology

## 2014-09-15 ENCOUNTER — Encounter (HOSPITAL_COMMUNITY): Payer: 59

## 2014-09-15 VITALS — BP 110/6 | HR 100 | Temp 98.5°F | Resp 16 | Wt 183.2 lb

## 2014-09-15 DIAGNOSIS — C50311 Malignant neoplasm of lower-inner quadrant of right female breast: Secondary | ICD-10-CM

## 2014-09-15 DIAGNOSIS — B370 Candidal stomatitis: Secondary | ICD-10-CM

## 2014-09-15 LAB — CBC WITH DIFFERENTIAL/PLATELET
BASOS PCT: 0 % (ref 0–1)
BLASTS: 0 %
Band Neutrophils: 33 % — ABNORMAL HIGH (ref 0–10)
Basophils Absolute: 0 10*3/uL (ref 0.0–0.1)
EOS PCT: 0 % (ref 0–5)
Eosinophils Absolute: 0 10*3/uL (ref 0.0–0.7)
HCT: 32.9 % — ABNORMAL LOW (ref 36.0–46.0)
HEMOGLOBIN: 11.2 g/dL — AB (ref 12.0–15.0)
LYMPHS ABS: 2.1 10*3/uL (ref 0.7–4.0)
Lymphocytes Relative: 6 % — ABNORMAL LOW (ref 12–46)
MCH: 28.9 pg (ref 26.0–34.0)
MCHC: 34 g/dL (ref 30.0–36.0)
MCV: 84.8 fL (ref 78.0–100.0)
MYELOCYTES: 16 %
Metamyelocytes Relative: 8 %
Monocytes Absolute: 0.7 10*3/uL (ref 0.1–1.0)
Monocytes Relative: 2 % — ABNORMAL LOW (ref 3–12)
Neutro Abs: 32.6 10*3/uL — ABNORMAL HIGH (ref 1.7–7.7)
Neutrophils Relative %: 35 % — ABNORMAL LOW (ref 43–77)
Platelets: 286 10*3/uL (ref 150–400)
Promyelocytes Absolute: 0 %
RBC: 3.88 MIL/uL (ref 3.87–5.11)
RDW: 15.1 % (ref 11.5–15.5)
WBC: 35.4 10*3/uL — ABNORMAL HIGH (ref 4.0–10.5)
nRBC: 0 /100 WBC

## 2014-09-15 LAB — COMPREHENSIVE METABOLIC PANEL
ALT: 36 U/L — ABNORMAL HIGH (ref 0–35)
ANION GAP: 7 (ref 5–15)
AST: 25 U/L (ref 0–37)
Albumin: 3.5 g/dL (ref 3.5–5.2)
Alkaline Phosphatase: 110 U/L (ref 39–117)
BILIRUBIN TOTAL: 0.4 mg/dL (ref 0.3–1.2)
BUN: 9 mg/dL (ref 6–23)
CO2: 30 mmol/L (ref 19–32)
CREATININE: 0.86 mg/dL (ref 0.50–1.10)
Calcium: 9.3 mg/dL (ref 8.4–10.5)
Chloride: 101 mEq/L (ref 96–112)
GFR calc non Af Amer: 82 mL/min — ABNORMAL LOW (ref >90)
Glucose, Bld: 102 mg/dL — ABNORMAL HIGH (ref 70–99)
Potassium: 3.3 mmol/L — ABNORMAL LOW (ref 3.5–5.1)
SODIUM: 138 mmol/L (ref 135–145)
Total Protein: 6.5 g/dL (ref 6.0–8.3)

## 2014-09-15 MED ORDER — FIRST-DUKES MOUTHWASH MT SUSP: 15.0000 mL | Freq: Four times a day (QID) | OROMUCOSAL | Status: DC | PRN

## 2014-09-15 MED ORDER — SODIUM CHLORIDE 0.9 % IJ SOLN
10.0000 mL | INTRAMUSCULAR | Status: DC | PRN
  Administered 2014-09-15: 10 mL via INTRAVENOUS
  Filled 2014-09-15: qty 10

## 2014-09-15 MED ORDER — HEPARIN SOD (PORK) LOCK FLUSH 100 UNIT/ML IV SOLN
500.0000 [IU] | Freq: Once | INTRAVENOUS | Status: AC
  Administered 2014-09-15: 500 [IU] via INTRAVENOUS
  Filled 2014-09-15: qty 5

## 2014-09-15 NOTE — Progress Notes (Signed)
LABS VIA PORT, VD25,CBCD,CMP

## 2014-09-15 NOTE — Assessment & Plan Note (Signed)
Patient notes throat fullness.  Minimal buccal mucosa white plaque.  Will treat with Magic Mouthwash QID PRN.  Rx 300 mL with 1 refill.

## 2014-09-15 NOTE — Patient Instructions (Addendum)
[place] at [facility]  Discharge Instructions:  I have changed your anti-nausea medicines with chemotherapy to Aloxi and Emend.   We will hold Zoladex and will not give it today. Recommendations regarding eating provided.  Rx for [employer].  Swish and swallow up to 4 times per day.  Return on 2/2 for follow-up. _______________________________________________________________  Thank you for choosing [place] at [facility] to provide your oncology and hematology care.  To afford each patient quality time with our providers, please arrive at least 15 minutes before your scheduled appointment.  You need to re-schedule your appointment if you arrive 10 or more minutes late.  We strive to give you quality time with our providers, and arriving late affects you and other patients whose appointments are after yours.  Also, if you no show three or more times for appointments you may be dismissed from the clinic.  Again, thank you for choosing [place] at [place] hope is that these requests will allow you access to exceptional care and in a timely manner. _______________________________________________________________  If you have questions after your visit, please contact our office at [phone] between the hours of 8:30 a.m. and 5:00 p.m. Voicemails left after 4:30 p.m. will not be returned until the following business day. _______________________________________________________________  For prescription refill requests, have your pharmacy contact our office. _______________________________________________________________  Recommendations made by the consultant and any test results will be sent to your referring physician. _______________________________________________________________

## 2014-09-15 NOTE — Progress Notes (Signed)
Lydia Stevens presented for labwork. Labs per MD order drawn via Portacath located in the left chest wall accessed with  H 20 needle. Good blood return present. Procedure without incident.  Portacath flushed with 68ml NS and 500U/79ml Heparin per protocol and needle removed intact. Patient tolerated procedure well.

## 2014-09-16 ENCOUNTER — Ambulatory Visit: Payer: 59

## 2014-09-16 LAB — VITAMIN D 25 HYDROXY (VIT D DEFICIENCY, FRACTURES): VIT D 25 HYDROXY: 17.5 ng/mL — AB (ref 30.0–100.0)

## 2014-09-19 ENCOUNTER — Encounter (HOSPITAL_COMMUNITY): Payer: Self-pay | Admitting: Emergency Medicine

## 2014-09-19 NOTE — Progress Notes (Signed)
Called to tell pt that her Vitamin D level is improved and to keep taking her Vitamin D as she has

## 2014-09-26 ENCOUNTER — Ambulatory Visit: Payer: 59

## 2014-09-26 ENCOUNTER — Other Ambulatory Visit: Payer: 59

## 2014-09-27 ENCOUNTER — Encounter (HOSPITAL_COMMUNITY): Payer: Self-pay

## 2014-09-27 ENCOUNTER — Encounter (HOSPITAL_COMMUNITY): Payer: 59 | Attending: Hematology & Oncology

## 2014-09-27 ENCOUNTER — Ambulatory Visit: Payer: 59

## 2014-09-27 ENCOUNTER — Encounter (HOSPITAL_BASED_OUTPATIENT_CLINIC_OR_DEPARTMENT_OTHER): Payer: 59 | Admitting: Hematology & Oncology

## 2014-09-27 ENCOUNTER — Encounter: Payer: Self-pay | Admitting: *Deleted

## 2014-09-27 DIAGNOSIS — Z5111 Encounter for antineoplastic chemotherapy: Secondary | ICD-10-CM

## 2014-09-27 DIAGNOSIS — C50311 Malignant neoplasm of lower-inner quadrant of right female breast: Secondary | ICD-10-CM | POA: Insufficient documentation

## 2014-09-27 DIAGNOSIS — Z170 Estrogen receptor positive status [ER+]: Secondary | ICD-10-CM | POA: Diagnosis not present

## 2014-09-27 DIAGNOSIS — B370 Candidal stomatitis: Secondary | ICD-10-CM | POA: Insufficient documentation

## 2014-09-27 LAB — CBC WITH DIFFERENTIAL/PLATELET
Basophils Absolute: 0 10*3/uL (ref 0.0–0.1)
Basophils Relative: 0 % (ref 0–1)
Eosinophils Absolute: 0 10*3/uL (ref 0.0–0.7)
Eosinophils Relative: 0 % (ref 0–5)
HEMATOCRIT: 32.7 % — AB (ref 36.0–46.0)
HEMOGLOBIN: 10.6 g/dL — AB (ref 12.0–15.0)
LYMPHS ABS: 0.9 10*3/uL (ref 0.7–4.0)
LYMPHS PCT: 4 % — AB (ref 12–46)
MCH: 27.8 pg (ref 26.0–34.0)
MCHC: 32.4 g/dL (ref 30.0–36.0)
MCV: 85.8 fL (ref 78.0–100.0)
Monocytes Absolute: 0.6 10*3/uL (ref 0.1–1.0)
Monocytes Relative: 3 % (ref 3–12)
Neutro Abs: 20.4 10*3/uL — ABNORMAL HIGH (ref 1.7–7.7)
Neutrophils Relative %: 93 % — ABNORMAL HIGH (ref 43–77)
PLATELETS: 337 10*3/uL (ref 150–400)
RBC: 3.81 MIL/uL — AB (ref 3.87–5.11)
RDW: 16.4 % — ABNORMAL HIGH (ref 11.5–15.5)
WBC: 21.9 10*3/uL — ABNORMAL HIGH (ref 4.0–10.5)

## 2014-09-27 LAB — COMPREHENSIVE METABOLIC PANEL
ALBUMIN: 3.5 g/dL (ref 3.5–5.2)
ALT: 22 U/L (ref 0–35)
AST: 31 U/L (ref 0–37)
Alkaline Phosphatase: 70 U/L (ref 39–117)
Anion gap: 8 (ref 5–15)
BUN: 12 mg/dL (ref 6–23)
CO2: 24 mmol/L (ref 19–32)
CREATININE: 0.78 mg/dL (ref 0.50–1.10)
Calcium: 9.1 mg/dL (ref 8.4–10.5)
Chloride: 107 mmol/L (ref 96–112)
GFR calc Af Amer: >90 mL/min (ref >90)
GFR calc non Af Amer: >90 mL/min (ref >90)
GLUCOSE: 159 mg/dL — AB (ref 70–99)
POTASSIUM: 3.6 mmol/L (ref 3.5–5.1)
SODIUM: 139 mmol/L (ref 135–145)
TOTAL PROTEIN: 6.3 g/dL (ref 6.0–8.3)
Total Bilirubin: 0.3 mg/dL (ref 0.3–1.2)

## 2014-09-27 MED ORDER — OMEPRAZOLE 20 MG PO CPDR: 20.0000 mg | DELAYED_RELEASE_CAPSULE | Freq: Every day | ORAL | Status: DC

## 2014-09-27 MED ORDER — HEPARIN SOD (PORK) LOCK FLUSH 100 UNIT/ML IV SOLN
500.0000 [IU] | Freq: Once | INTRAVENOUS | Status: AC | PRN
  Administered 2014-09-27: 500 [IU]

## 2014-09-27 MED ORDER — SODIUM CHLORIDE 0.9 % IV SOLN: 150.0000 mg | Freq: Once | INTRAVENOUS | Status: DC

## 2014-09-27 MED ORDER — ZOLPIDEM TARTRATE 10 MG PO TABS: 10.0000 mg | ORAL_TABLET | Freq: Every evening | ORAL | Status: DC | PRN

## 2014-09-27 MED ORDER — HEPARIN SOD (PORK) LOCK FLUSH 100 UNIT/ML IV SOLN
INTRAVENOUS | Status: AC
  Filled 2014-09-27: qty 5

## 2014-09-27 MED ORDER — PALONOSETRON HCL INJECTION 0.25 MG/5ML
0.2500 mg | Freq: Once | INTRAVENOUS | Status: AC
  Administered 2014-09-27: 0.25 mg via INTRAVENOUS

## 2014-09-27 MED ORDER — DEXTROSE 5 % IV SOLN
75.0000 mg/m2 | Freq: Once | INTRAVENOUS | Status: AC
  Administered 2014-09-27: 150 mg via INTRAVENOUS
  Filled 2014-09-27: qty 15

## 2014-09-27 MED ORDER — SODIUM CHLORIDE 0.9 % IV SOLN
Freq: Once | INTRAVENOUS | Status: AC
  Administered 2014-09-27: 11:00:00 via INTRAVENOUS
  Filled 2014-09-27: qty 5

## 2014-09-27 MED ORDER — SODIUM CHLORIDE 0.9 % IV SOLN
600.0000 mg/m2 | Freq: Once | INTRAVENOUS | Status: AC
  Administered 2014-09-27: 1240 mg via INTRAVENOUS
  Filled 2014-09-27: qty 62

## 2014-09-27 MED ORDER — DEXAMETHASONE SODIUM PHOSPHATE 10 MG/ML IJ SOLN: 10.0000 mg | Freq: Once | INTRAMUSCULAR | Status: DC

## 2014-09-27 MED ORDER — SODIUM CHLORIDE 0.9 % IV SOLN
Freq: Once | INTRAVENOUS | Status: AC
  Administered 2014-09-27: 09:00:00 via INTRAVENOUS

## 2014-09-27 MED ORDER — PALONOSETRON HCL INJECTION 0.25 MG/5ML
INTRAVENOUS | Status: AC
  Filled 2014-09-27: qty 5

## 2014-09-27 MED ORDER — SODIUM CHLORIDE 0.9 % IJ SOLN
10.0000 mL | INTRAMUSCULAR | Status: DC | PRN
  Administered 2014-09-27: 10 mL
  Filled 2014-09-27: qty 10

## 2014-09-27 NOTE — Progress Notes (Deleted)
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 N0 M0 stage IA  ER+ (100%), PR+ (71%), Her-2 negative  Core needle biopsy of L breast lesion c/w fibroadenoma  Oncotype DX score 24, 16% risk of recurrence  Monthly Zoladex  CURRENT THERAPY: To Start TC 09/06/2013        Monthly Zoladex for ovarian suppression  INTERVAL HISTORY: Lydia Stevens 44 y.o. female returns for follow-up of ductal carcinoma of the R breast. She underwent mastectomy with reconstruction in December.   She is doing better. Her appetite has picked up. Weight is up slightly. Stomach pain is improving at her surgical site. She has noticed occasional mucus in her stool which is new. She denies any blood in her stool. Nausea is currently improved.   MEDICAL HISTORY: Past Medical History  Diagnosis Date  . Scoliosis   . Breast cancer     2015  . Anemia     before     has Breast cancer of lower-inner quadrant of right female breast and Genetic testing on her problem list.      Breast cancer of lower-inner quadrant of right female breast   06/07/2014 Initial Biopsy Right breast needle biopsy 5:00 position: Invasive ductal carcinoma with DCIS, ER 100%, PR 71%, Ki-67 33%, HER-2 negative ratio 1.03   06/17/2014 Breast MRI Right breast: 10 x 7 x 5 mm biopsy-proven IDC with DCIS, left breast 7 x 7 x 7 mm fibroadenoma, left upper quadrant of the abdomen abutting the peritoneum 3 x 1.1 cm oval soft tissue mass   07/27/2014 Surgery Right breast mastectomy: Invasive ductal carcinoma grade 3, 2 cm, intermediate grade DCIS, lymphovascular invasion identified, 2 SLN negative, T1 C. N0 M0 stage IA, ER positive, PR 7%, HER-2 negative, Ki-67 33%   07/27/2014 Oncotype testing Recurrence Score  of 24, placing patient in the intermediate risk group   09/06/2014 -  Chemotherapy Taxotere/Cytoxan    Breast cancer risk profile:  She menarched at early age of 69  She had one pregnancy, her first child was born at age 21  She is currently on Depo Provera shots for unknown time in the [place] She was never exposed to fertility medications or hormone replacement therapy.  She has family history of Breast/GYN/GI cancer; 2 half-sisters with breast cancer    has No Known Allergies.  [name] does not currently have medications on file.  SURGICAL HISTORY: Past Surgical History  Procedure Laterality Date  . Cesarean section  1996  . Wisdom tooth extraction    . Mastectomy w/ sentinel node biopsy Right 07/27/2014    Procedure: RIGHT MASTECTOMY WITH RIGHT AXILLARY SENTINEL LYMPH NODE BIOPSY;  Surgeon: Alphonsa Overall, MD;  Location: [place];  Service: General;  Laterality: Right;  . Latissimus flap to breast Right 07/27/2014    Procedure: TRAM FLAP RECONSTUCTION RIGHT CHEST;  Surgeon: Irene Limbo, MD;  Location: [place];  Service: Plastics;  Laterality: Right;  . Portacath placement Left 09/05/2013    SOCIAL HISTORY: History   Social History  . Marital Status: Married    Spouse Name: N/A    Number of Children: 1  . Years of Education: N/A   Occupational History  . Not on file.   Social History Main Topics  .  Smoking status: Never Smoker   . Smokeless tobacco: Never Used  . Alcohol Use: No  . Drug Use: No  . Sexual Activity: Not on file   Other Topics Concern  . Not on file   Social History Narrative    FAMILY HISTORY: Family History  Problem Relation Age of Onset  . Breast cancer Paternal Aunt 72  . Heart attack Father   . Breast cancer Maternal Aunt 91  . Breast cancer Sister 40  . Breast cancer Maternal Aunt 54    Review of Systems  Constitutional: Negative for fever, chills, weight loss and malaise/fatigue.  HENT: Negative for congestion,  hearing loss, nosebleeds, sore throat and tinnitus.   Eyes: Negative for blurred vision, double vision, pain and discharge.  Respiratory: Negative for cough, hemoptysis, sputum production, shortness of breath and wheezing.   Cardiovascular: Negative for chest pain, palpitations, claudication, leg swelling and PND.  Gastrointestinal: Negative for heartburn, nausea, vomiting, abdominal pain, diarrhea, constipation, blood in stool and melena.  Genitourinary: Negative for dysuria, urgency, frequency and hematuria.  Musculoskeletal: Negative for myalgias, joint pain and falls.  Skin: Negative for itching and rash.  Neurological: Negative for dizziness, tingling, tremors, sensory change, speech change, focal weakness, seizures, loss of consciousness, weakness and headaches.  Endo/Heme/Allergies: Does not bruise/bleed easily.  Psychiatric/Behavioral: Negative for depression, suicidal ideas, memory loss and substance abuse. The patient is not nervous/anxious and does not have insomnia.     PHYSICAL EXAMINATION  ECOG PERFORMANCE STATUS: 0 - Asymptomatic  There were no vitals filed for this visit.  Physical Exam  Constitutional: She is oriented to person, place, and time and well-developed, well-nourished, and in no distress.  HENT:  Head: Normocephalic and atraumatic.  Nose: Nose normal.  Mouth/Throat: Oropharynx is clear and moist. No oropharyngeal exudate.  Eyes: Conjunctivae and EOM are normal. Pupils are equal, round, and reactive to light. Right eye exhibits no discharge. Left eye exhibits no discharge. No scleral icterus.  Neck: Normal range of motion. Neck supple. No tracheal deviation present. No thyromegaly present.  Cardiovascular: Normal rate, regular rhythm and normal heart sounds.  Exam reveals no gallop and no friction rub.   No murmur heard. Pulmonary/Chest: Effort normal and breath sounds normal. She has no wheezes. She has no rales.  Breast reconstruction intact, well healed    Abdominal: Soft. Bowel sounds are normal. She exhibits no distension and no mass. There is no tenderness. There is no rebound and no guarding.  Well headling surgical incision site  Musculoskeletal: Normal range of motion. She exhibits no edema.  Lymphadenopathy:    She has no cervical adenopathy.  Neurological: She is alert and oriented to person, place, and time. She has normal reflexes. No cranial nerve deficit. Gait normal. Coordination normal.  Skin: Skin is warm and dry. No rash noted.  Psychiatric: Mood, memory, affect and judgment normal.  Nursing note and vitals reviewed.   LABORATORY DATA:  CBC    Component Value Date/Time   WBC 7.6 09/05/2014 0914   WBC 8.5 08/17/2014 1121   RBC 4.17 09/05/2014 0914   RBC 4.24 08/17/2014 1121   HGB 11.7 09/05/2014 0914   HGB 12.3 08/17/2014 1121   HCT 35.4 09/05/2014 0914   HCT 37.5 08/17/2014 1121   PLT 297 09/05/2014 0914   PLT 307 08/17/2014 1121   MCV 84.9 09/05/2014 0914   MCV 88.4 08/17/2014 1121   MCH 28.1 09/05/2014 0914   MCH 29.0 08/17/2014 1121   MCHC 33.1 09/05/2014 0914  MCHC 32.8 08/17/2014 1121   RDW 14.9* 09/05/2014 0914   RDW 15.5 08/17/2014 1121   LYMPHSABS 1.6 09/05/2014 0914   MONOABS 0.5 09/05/2014 0914   EOSABS 0.2 09/05/2014 0914   BASOSABS 0.0 09/05/2014 0914   CMP     Component Value Date/Time   NA 139 09/05/2014 0914   NA 144 07/22/2014 1055   K 3.8 09/05/2014 0914   K 3.6* 07/22/2014 1055   CL 107 07/22/2014 1055   CO2 29 09/05/2014 0914   CO2 23 07/22/2014 1055   GLUCOSE 88 09/05/2014 0914   GLUCOSE 82 07/22/2014 1055   BUN 6.0* 09/05/2014 0914   BUN 10 07/22/2014 1055   CREATININE 0.8 09/05/2014 0914   CREATININE 0.85 07/22/2014 1055   CALCIUM 9.3 09/05/2014 0914   CALCIUM 9.1 07/22/2014 1055   PROT 6.6 09/05/2014 0914   ALBUMIN 3.4* 09/05/2014 0914   AST 16 09/05/2014 0914   ALT 9 09/05/2014 0914   ALKPHOS 83 09/05/2014 0914   BILITOT 0.62 09/05/2014 0914   GFRNONAA 83*  07/22/2014 1055   GFRAA >90 07/22/2014 1055     PENDING LABS:   RADIOGRAPHIC STUDIES:  07/06/2014 EXAM: CT CHEST, ABDOMEN, AND PELVIS WITH CONTRAST  IMPRESSION: CT CHEST IMPRESSION  No acute process or evidence of metastatic disease in the chest.  CT ABDOMEN AND PELVIS IMPRESSION  1. Isolated left upper quadrant "Mass" which is indeterminate. Given its density similarity to the spleen, splenule is suspected. Atypical isolated appearance of omental/peritoneal metastasis felt less likely. Consider confirmation of splenule with nuclear medicine damaged right blood cell or liver/ spleen MAA scan. 2. Otherwise, no evidence of metastatic disease within the abdomen or pelvis. 3. Small volume cul-de-sac fluid which could be physiologic. Given slight increased density within, question recent cyst or follicle rupture.   Electronically Signed  By: Abigail Miyamoto M.D.  On: 07/06/2014 13:29    ASSESSMENT and THERAPY PLAN:    Breast cancer of lower-inner quadrant of right female breast Pleasant 44 year old female with newly diagnosed Stage IA ER+ PR+ Her-2 neu negative carcinoma of the R breast. She opted for mastectomy and immediate reconstruction.  She is doing well.  She is here today for initiation of adjuvant TC for 4 cycles. Additional questions were answered today prior to her therapy.  Zoladex was started and she is due to continue this monthly. Once chemotherapy is complete, she will be started on an AI, preferably aromasin.   I have advised the patient if she has any problems or concerns prior to her follow-up to please call or come in.  I will see her back next week to assess tolerance.  If she has done well, I will recommend follow-up on chemotherapy treatment days only.     All questions were answered. The patient knows to call the clinic with any problems, questions or concerns. We can certainly see the patient much sooner if necessary. Molli Hazard 09/15/2014

## 2014-09-27 NOTE — Patient Instructions (Addendum)
[facility] Discharge Instructions  RECOMMENDATIONS MADE BY THE CONSULTANT AND ANY TEST RESULTS WILL BE SENT TO YOUR REFERRING PHYSICIAN.  SPECIAL INSTRUCTIONS/FOLLOW-UP:  Please call prior to your next visit with any problems or concerns. We will see you back again in three with labs and an office visit. You will be due for cycle # 3 of TC!! We have called in the following prescriptions: omeprazole You have refills on your drisdol (vitamin D) call your pharmacy for a refill and continue this weekly    Thank you for choosing [facility] to provide your oncology and hematology care.  To afford each patient quality time with our providers, please arrive at least 15 minutes before your scheduled appointment time.  With your help, our goal is to use those 15 minutes to complete the necessary work-up to ensure our physicians have the information they need to help with your evaluation and healthcare recommendations.    Effective January 1st, 2014, we ask that you re-schedule your appointment with our physicians should you arrive 10 or more minutes late for your appointment.  We strive to give you quality time with our providers, and arriving late affects you and other patients whose appointments are after yours.    Again, thank you for choosing [facility].  Our hope is that these requests will decrease the amount of time that you wait before being seen by our physicians.       _____________________________________________________________  Should you have questions after your visit to [facility], please contact our office at [phone] between the hours of 8:30 a.m. and 5:00 p.m.  Voicemails left after 4:30 p.m. will not be returned until the following business day.  For prescription refill requests, have your pharmacy contact our office with your prescription refill request.

## 2014-09-27 NOTE — Progress Notes (Signed)
Lydia Stevens Tolerated chemotherapy well today discharged ambulatory

## 2014-09-27 NOTE — Patient Instructions (Signed)
[facility] Discharge Instructions for Patients Receiving Chemotherapy  Today you received the following chemotherapy agents taxotere, cytoxan Please call the clinic if you have any questions or concerns  To help prevent nausea and vomiting after your treatment, we encourage you to take your nausea medication    If you develop nausea and vomiting that is not controlled by your nausea medication, call the clinic. If it is after clinic hours your family physician or the after hours number for the clinic or go to the Emergency Department.   BELOW ARE SYMPTOMS THAT SHOULD BE REPORTED IMMEDIATELY:  *FEVER GREATER THAN 101.0 F  *CHILLS WITH OR WITHOUT FEVER  NAUSEA AND VOMITING THAT IS NOT CONTROLLED WITH YOUR NAUSEA MEDICATION  *UNUSUAL SHORTNESS OF BREATH  *UNUSUAL BRUISING OR BLEEDING  TENDERNESS IN MOUTH AND THROAT WITH OR WITHOUT PRESENCE OF ULCERS  *URINARY PROBLEMS  *BOWEL PROBLEMS  UNUSUAL RASH Items with * indicate a potential emergency and should be followed up as soon as possible.  One of the nurses will contact you 24 hours after your treatment. Please let the nurse know about any problems that you may have experienced. Feel free to call the clinic you have any questions or concerns. The clinic phone number is [phone].   I have been informed and understand all the instructions given to me. I know to contact the clinic, my physician, or go to the Emergency Department if any problems should occur. I do not have any questions at this time, but understand that I may call the clinic during office hours or the Patient Navigator at [phone] should I have any questions or need assistance in obtaining follow up care.    Docetaxel injection What is this medicine? DOCETAXEL (doe se TAX el) is a chemotherapy drug. It targets fast dividing cells, like cancer cells, and causes these cells to die. This medicine is used to treat many types of cancers like  breast cancer, certain stomach cancers, head and neck cancer, lung cancer, and prostate cancer. This medicine may be used for other purposes; ask your health care provider or pharmacist if you have questions. COMMON BRAND NAME(S): Docefrez, Taxotere What should I tell my health care provider before I take this medicine? They need to know if you have any of these conditions: -infection (especially a virus infection such as chickenpox, cold sores, or herpes) -liver disease -low blood counts, like low white cell, platelet, or red cell counts -an unusual or allergic reaction to docetaxel, polysorbate 80, other chemotherapy agents, other medicines, foods, dyes, or preservatives -pregnant or trying to get pregnant -breast-feeding How should I use this medicine? This drug is given as an infusion into a vein. It is administered in a hospital or clinic by a specially trained health care professional. Talk to your pediatrician regarding the use of this medicine in children. Special care may be needed. Overdosage: If you think you have taken too much of this medicine contact a poison control center or emergency room at once. NOTE: This medicine is only for you. Do not share this medicine with others. What if I miss a dose? It is important not to miss your dose. Call your doctor or health care professional if you are unable to keep an appointment. What may interact with this medicine? -cyclosporine -erythromycin -ketoconazole -medicines to increase blood counts like filgrastim, pegfilgrastim, sargramostim -vaccines Talk to your doctor or health care professional before taking any of these medicines: -acetaminophen -aspirin -ibuprofen -ketoprofen -naproxen This  list may not describe all possible interactions. Give your health care provider a list of all the medicines, herbs, non-prescription drugs, or dietary supplements you use. Also tell them if you smoke, drink alcohol, or use illegal drugs. Some  items may interact with your medicine. What should I watch for while using this medicine? Your condition will be monitored carefully while you are receiving this medicine. You will need important blood work done while you are taking this medicine. This drug may make you feel generally unwell. This is not uncommon, as chemotherapy can affect healthy cells as well as cancer cells. Report any side effects. Continue your course of treatment even though you feel ill unless your doctor tells you to stop. In some cases, you may be given additional medicines to help with side effects. Follow all directions for their use. Call your doctor or health care professional for advice if you get a fever, chills or sore throat, or other symptoms of a cold or flu. Do not treat yourself. This drug decreases your body's ability to fight infections. Try to avoid being around people who are sick. This medicine may increase your risk to bruise or bleed. Call your doctor or health care professional if you notice any unusual bleeding. Be careful brushing and flossing your teeth or using a toothpick because you may get an infection or bleed more easily. If you have any dental work done, tell your dentist you are receiving this medicine. Avoid taking products that contain aspirin, acetaminophen, ibuprofen, naproxen, or ketoprofen unless instructed by your doctor. These medicines may hide a fever. This medicine contains an alcohol in the product. You may get drowsy or dizzy. Do not drive, use machinery, or do anything that needs mental alertness until you know how this medicine affects you. Do not stand or sit up quickly, especially if you are an older patient. This reduces the risk of dizzy or fainting spells. Avoid alcoholic drinks Do not become pregnant while taking this medicine. Women should inform their doctor if they wish to become pregnant or think they might be pregnant. There is a potential for serious side effects to an  unborn child. Talk to your health care professional or pharmacist for more information. Do not breast-feed an infant while taking this medicine. What side effects may I notice from receiving this medicine? Side effects that you should report to your doctor or health care professional as soon as possible: -allergic reactions like skin rash, itching or hives, swelling of the face, lips, or tongue -low blood counts - This drug may decrease the number of white blood cells, red blood cells and platelets. You may be at increased risk for infections and bleeding. -signs of infection - fever or chills, cough, sore throat, pain or difficulty passing urine -signs of decreased platelets or bleeding - bruising, pinpoint red spots on the skin, black, tarry stools, nosebleeds -signs of decreased red blood cells - unusually weak or tired, fainting spells, lightheadedness -breathing problems -fast or irregular heartbeat -low blood pressure -mouth sores -nausea and vomiting -pain, swelling, redness or irritation at the injection site -pain, tingling, numbness in the hands or feet -swelling of the ankle, feet, hands -weight gain Side effects that usually do not require medical attention (report to your prescriber or health care professional if they continue or are bothersome): -bone pain -complete hair loss including hair on your head, underarms, pubic hair, eyebrows, and eyelashes -diarrhea -excessive tearing -changes in the color of fingernails -loosening of the fingernails -  nausea -muscle pain -red flush to skin -sweating -weak or tired This list may not describe all possible side effects. Call your doctor for medical advice about side effects. You may report side effects to FDA at 1-800-FDA-1088. Where should I keep my medicine? This drug is given in a hospital or clinic and will not be stored at home. NOTE: This sheet is a summary. It may not cover all possible information. If you have questions  about this medicine, talk to your doctor, pharmacist, or health care provider.  2015, Elsevier/Gold Standard. (2013-07-08 22:21:02)   Cyclophosphamide injection What is this medicine? CYCLOPHOSPHAMIDE (sye kloe FOSS fa mide) is a chemotherapy drug. It slows the growth of cancer cells. This medicine is used to treat many types of cancer like lymphoma, myeloma, leukemia, breast cancer, and ovarian cancer, to name a few. This medicine may be used for other purposes; ask your health care provider or pharmacist if you have questions. COMMON BRAND NAME(S): Cytoxan, Neosar What should I tell my health care provider before I take this medicine? They need to know if you have any of these conditions: -blood disorders -history of other chemotherapy -infection -kidney disease -liver disease -recent or ongoing radiation therapy -tumors in the bone marrow -an unusual or allergic reaction to cyclophosphamide, other chemotherapy, other medicines, foods, dyes, or preservatives -pregnant or trying to get pregnant -breast-feeding How should I use this medicine? This drug is usually given as an injection into a vein or muscle or by infusion into a vein. It is administered in a hospital or clinic by a specially trained health care professional. Talk to your pediatrician regarding the use of this medicine in children. Special care may be needed. Overdosage: If you think you have taken too much of this medicine contact a poison control center or emergency room at once. NOTE: This medicine is only for you. Do not share this medicine with others. What if I miss a dose? It is important not to miss your dose. Call your doctor or health care professional if you are unable to keep an appointment. What may interact with this medicine? This medicine may interact with the following medications: -amiodarone -amphotericin B -azathioprine -certain antiviral medicines for HIV or AIDS such as protease inhibitors (e.g.,  indinavir, ritonavir) and zidovudine -certain blood pressure medications such as benazepril, captopril, enalapril, fosinopril, lisinopril, moexipril, monopril, perindopril, quinapril, ramipril, trandolapril -certain cancer medications such as anthracyclines (e.g., daunorubicin, doxorubicin), busulfan, cytarabine, paclitaxel, pentostatin, tamoxifen, trastuzumab -certain diuretics such as chlorothiazide, chlorthalidone, hydrochlorothiazide, indapamide, metolazone -certain medicines that treat or prevent blood clots like warfarin -certain muscle relaxants such as succinylcholine -cyclosporine -etanercept -indomethacin -medicines to increase blood counts like filgrastim, pegfilgrastim, sargramostim -medicines used as general anesthesia -metronidazole -natalizumab This list may not describe all possible interactions. Give your health care provider a list of all the medicines, herbs, non-prescription drugs, or dietary supplements you use. Also tell them if you smoke, drink alcohol, or use illegal drugs. Some items may interact with your medicine. What should I watch for while using this medicine? Visit your doctor for checks on your progress. This drug may make you feel generally unwell. This is not uncommon, as chemotherapy can affect healthy cells as well as cancer cells. Report any side effects. Continue your course of treatment even though you feel ill unless your doctor tells you to stop. Drink water or other fluids as directed. Urinate often, even at night. In some cases, you may be given additional medicines to help with side  effects. Follow all directions for their use. Call your doctor or health care professional for advice if you get a fever, chills or sore throat, or other symptoms of a cold or flu. Do not treat yourself. This drug decreases your body's ability to fight infections. Try to avoid being around people who are sick. This medicine may increase your risk to bruise or bleed. Call  your doctor or health care professional if you notice any unusual bleeding. Be careful brushing and flossing your teeth or using a toothpick because you may get an infection or bleed more easily. If you have any dental work done, tell your dentist you are receiving this medicine. You may get drowsy or dizzy. Do not drive, use machinery, or do anything that needs mental alertness until you know how this medicine affects you. Do not become pregnant while taking this medicine or for 1 year after stopping it. Women should inform their doctor if they wish to become pregnant or think they might be pregnant. Men should not father a child while taking this medicine and for 4 months after stopping it. There is a potential for serious side effects to an unborn child. Talk to your health care professional or pharmacist for more information. Do not breast-feed an infant while taking this medicine. This medicine may interfere with the ability to have a child. This medicine has caused ovarian failure in some women. This medicine has caused reduced sperm counts in some men. You should talk with your doctor or health care professional if you are concerned about your fertility. If you are going to have surgery, tell your doctor or health care professional that you have taken this medicine. What side effects may I notice from receiving this medicine? Side effects that you should report to your doctor or health care professional as soon as possible: -allergic reactions like skin rash, itching or hives, swelling of the face, lips, or tongue -low blood counts - this medicine may decrease the number of white blood cells, red blood cells and platelets. You may be at increased risk for infections and bleeding. -signs of infection - fever or chills, cough, sore throat, pain or difficulty passing urine -signs of decreased platelets or bleeding - bruising, pinpoint red spots on the skin, black, tarry stools, blood in the  urine -signs of decreased red blood cells - unusually weak or tired, fainting spells, lightheadedness -breathing problems -dark urine -dizziness -palpitations -swelling of the ankles, feet, hands -trouble passing urine or change in the amount of urine -weight gain -yellowing of the eyes or skin Side effects that usually do not require medical attention (report to your doctor or health care professional if they continue or are bothersome): -changes in nail or skin color -hair loss -missed menstrual periods -mouth sores -nausea, vomiting This list may not describe all possible side effects. Call your doctor for medical advice about side effects. You may report side effects to FDA at 1-800-FDA-1088. Where should I keep my medicine? This drug is given in a hospital or clinic and will not be stored at home. NOTE: This sheet is a summary. It may not cover all possible information. If you have questions about this medicine, talk to your doctor, pharmacist, or health care provider.  2015, Elsevier/Gold Standard. (2012-06-26 16:22:58)

## 2014-09-28 ENCOUNTER — Ambulatory Visit (HOSPITAL_COMMUNITY): Payer: 59 | Admitting: Hematology & Oncology

## 2014-09-28 NOTE — Progress Notes (Signed)
[facility]  Clinical Social Work was referred by [facility] rounding and needed follow up on emotional coping. CSW met with Pt during her chemo to reassess needs and to follow up on needs. Pt reports to be doing better currently and did review emotional coping in her Journey book. She found this very helpful. Pt denies new concerns and reports to be doing well currently. Pt aware of how to reach out to CSW as needed.   Clinical Social Work interventions: Supportive follow up   Loren Racer, [place] Tuesdays 8:30-1pm Wednesdays 8:30-12pm  [phone]

## 2014-09-29 ENCOUNTER — Encounter (HOSPITAL_COMMUNITY): Payer: Self-pay

## 2014-09-29 ENCOUNTER — Encounter (HOSPITAL_BASED_OUTPATIENT_CLINIC_OR_DEPARTMENT_OTHER): Payer: 59

## 2014-09-29 DIAGNOSIS — C50311 Malignant neoplasm of lower-inner quadrant of right female breast: Secondary | ICD-10-CM

## 2014-09-29 DIAGNOSIS — Z5189 Encounter for other specified aftercare: Secondary | ICD-10-CM

## 2014-09-29 MED ORDER — PEGFILGRASTIM INJECTION 6 MG/0.6ML ~~LOC~~
6.0000 mg | PREFILLED_SYRINGE | Freq: Once | SUBCUTANEOUS | Status: AC
  Administered 2014-09-29: 6 mg via SUBCUTANEOUS

## 2014-09-29 MED ORDER — PEGFILGRASTIM INJECTION 6 MG/0.6ML ~~LOC~~
PREFILLED_SYRINGE | SUBCUTANEOUS | Status: AC
  Filled 2014-09-29: qty 0.6

## 2014-09-29 NOTE — Progress Notes (Signed)
Corri L Molden's reason for visit today is for an injection per MD orders.   [name] also received neulasta 6mg  per MD orders; see [facility] for administration details.  [name] tolerated all procedures well and without incident; questions were answered and patient was discharged.

## 2014-09-29 NOTE — Patient Instructions (Signed)
[place] at [facility]  Discharge Instructions:  You received neulasta 6 mg today.  Please call the clinic if you have any questions or concerns _______________________________________________________________  Thank you for choosing [place] at [facility] to provide your oncology and hematology care.  To afford each patient quality time with our providers, please arrive at least 15 minutes before your scheduled appointment.  You need to re-schedule your appointment if you arrive 10 or more minutes late.  We strive to give you quality time with our providers, and arriving late affects you and other patients whose appointments are after yours.  Also, if you no show three or more times for appointments you may be dismissed from the clinic.  Again, thank you for choosing [place] at [place] hope is that these requests will allow you access to exceptional care and in a timely manner. _______________________________________________________________  If you have questions after your visit, please contact our office at [phone] between the hours of 8:30 a.m. and 5:00 p.m. Voicemails left after 4:30 p.m. will not be returned until the following business day. _______________________________________________________________  For prescription refill requests, have your pharmacy contact our office. _______________________________________________________________  Recommendations made by the consultant and any test results will be sent to your referring physician. _______________________________________________________________

## 2014-10-11 ENCOUNTER — Encounter (HOSPITAL_COMMUNITY): Payer: Self-pay | Admitting: Hematology & Oncology

## 2014-10-11 NOTE — Assessment & Plan Note (Signed)
Pleasant 44 year old female with stage IA ER positive PR positive and HER-2 negative carcinoma of the right breast. She underwent mastectomy with immediate reconstruction. She continues to do well. She is here for her next cycle of Taxotere and Cytoxan. Her abdomen has still been quite sore since her surgery but she is making good progress in regards to healing I discussed with the patient that we need to get her Zoladex restarted in preparation for aromatase inhibitor therapy after completion of chemotherapy.  She has no other questions or concerns today. I have advised her if she has ongoing issues with her bowels to let us know. Otherwise we will plan on return in 3 weeks prior to her next cycle of chemotherapy.

## 2014-10-11 NOTE — Progress Notes (Signed)
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 N0 M0 stage IA  ER+ (100%), PR+ (71%), Her-2 negative  Core needle biopsy of L breast lesion c/w fibroadenoma  Oncotype DX score 24, 16% risk of recurrence  Monthly Zoladex  CURRENT THERAPY: To Start TC 09/06/2013        Monthly Zoladex for ovarian suppression  INTERVAL HISTORY: Lydia Stevens 44 y.o. female returns for follow-up of stage I ER positive, PR positive, and HER-2 negative carcinoma of the right breast. She is here today for her next cycle of Taxotere and Cytoxan. Her nausea is improved from her prior cycle. She has a few days where her appetite is poor but it gradually picks up. She has no major complaints today. She noticed one episode of "mucous" in her stool. But his had none since she denies any bright red blood per rectum or black or tarry stool. She is sleeping well.   MEDICAL HISTORY: Past Medical History  Diagnosis Date  . Scoliosis   . Breast cancer     2015  . Anemia     before     has Breast cancer of lower-inner quadrant of right female breast; Genetic testing; and Oral candidiasis on her problem list.      Breast cancer of lower-inner quadrant of right female breast   06/07/2014 Initial Biopsy Right breast needle biopsy 5:00 position: Invasive ductal carcinoma with DCIS, ER 100%, PR 71%, Ki-67 33%, HER-2 negative ratio 1.03   06/17/2014 Breast MRI Right breast: 10 x 7 x 5 mm biopsy-proven IDC with DCIS, left breast 7 x 7 x 7 mm fibroadenoma, left upper quadrant of the abdomen abutting the peritoneum 3 x 1.1 cm oval soft tissue mass   07/27/2014 Surgery Right breast mastectomy: Invasive ductal carcinoma grade 3, 2 cm, intermediate grade DCIS, lymphovascular invasion  identified, 2 SLN negative, T1 C. N0 M0 stage IA, ER positive, PR 7%, HER-2 negative, Ki-67 33%   07/27/2014 Oncotype testing Recurrence Score of 24, placing patient in the intermediate risk group   09/06/2014 -  Chemotherapy Taxotere/Cytoxan    09/08/2014 Adverse Reaction Nasuea and voming three times x 2 days.  Added Aloxi and Emend to anti-emetic regimen.   Breast cancer risk profile:  She menarched at early age of 8  She had one pregnancy, her first child was born at age 33  She is currently on Depo Provera shots for unknown time in the [facility] She was never exposed to fertility medications or hormone replacement therapy.  She has family history of Breast/GYN/GI cancer; 2 half-sisters with breast cancer    has No Known Allergies.  Ms. Kempton does not currently have medications on file.  SURGICAL HISTORY: Past Surgical History  Procedure Laterality Date  . Cesarean section  1996  . Wisdom tooth extraction    . Mastectomy w/ sentinel node biopsy Right 07/27/2014    Procedure: RIGHT MASTECTOMY WITH RIGHT AXILLARY SENTINEL LYMPH NODE BIOPSY;  Surgeon: Alphonsa Overall, MD;  Location: [facility];  Service: General;  Laterality: Right;  . Latissimus flap to breast Right 07/27/2014    Procedure: TRAM FLAP RECONSTUCTION RIGHT CHEST;  Surgeon: Irene Limbo, MD;  Location: [facility];  Service: Plastics;  Laterality: Right;  . Portacath placement Left 09/05/2013  SOCIAL HISTORY: History   Social History  . Marital Status: Married    Spouse Name: N/A  . Number of Children: 1  . Years of Education: N/A   Occupational History  . Not on file.   Social History Main Topics  . Smoking status: Never Smoker   . Smokeless tobacco: Never Used  . Alcohol Use: No  . Drug Use: No  . Sexual Activity: Not on file   Other Topics Concern  . Not on file   Social History Narrative    FAMILY HISTORY: Family History  Problem Relation Age of Onset  . Breast cancer Paternal Aunt 80  .  Heart attack Father   . Breast cancer Maternal Aunt 66  . Breast cancer Sister 15  . Breast cancer Maternal Aunt 54    Review of Systems  Constitutional: Negative for fever, chills, weight loss and malaise/fatigue.  HENT: Negative for congestion, hearing loss, nosebleeds, sore throat and tinnitus.   Eyes: Negative for blurred vision, double vision, pain and discharge.  Respiratory: Negative for cough, hemoptysis, sputum production, shortness of breath and wheezing.   Cardiovascular: Negative for chest pain, palpitations, claudication, leg swelling and PND.  Gastrointestinal: Negative for heartburn, nausea, vomiting, abdominal pain, diarrhea, constipation, blood in stool and melena.  Genitourinary: Negative for dysuria, urgency, frequency and hematuria.  Musculoskeletal: Negative for myalgias, joint pain and falls.  Skin: Negative for itching and rash.  Neurological: Negative for dizziness, tingling, tremors, sensory change, speech change, focal weakness, seizures, loss of consciousness, weakness and headaches.  Endo/Heme/Allergies: Does not bruise/bleed easily.  Psychiatric/Behavioral: Negative for depression, suicidal ideas, memory loss and substance abuse. The patient is not nervous/anxious and does not have insomnia.     PHYSICAL EXAMINATION  ECOG PERFORMANCE STATUS: 0 - Asymptomatic  There were no vitals filed for this visit.  Physical Exam  Constitutional: She is oriented to person, place, and time and well-developed, well-nourished, and in no distress.  HENT:  Head: Normocephalic and atraumatic.  Nose: Nose normal.  Mouth/Throat: Oropharynx is clear and moist. No oropharyngeal exudate.  Eyes: Conjunctivae and EOM are normal. Pupils are equal, round, and reactive to light. Right eye exhibits no discharge. Left eye exhibits no discharge. No scleral icterus.  Neck: Normal range of motion. Neck supple. No tracheal deviation present. No thyromegaly present.  Cardiovascular: Normal  rate, regular rhythm and normal heart sounds.  Exam reveals no gallop and no friction rub.   No murmur heard. Pulmonary/Chest: Effort normal and breath sounds normal. She has no wheezes. She has no rales.  Breast reconstruction intact, well healed  Abdominal: Soft. Bowel sounds are normal. She exhibits no distension and no mass. There is no tenderness. There is no rebound and no guarding.  Abdominal surgical site is well healed  Musculoskeletal: Normal range of motion. She exhibits no edema.  Lymphadenopathy:    She has no cervical adenopathy.  Neurological: She is alert and oriented to person, place, and time. She has normal reflexes. No cranial nerve deficit. Gait normal. Coordination normal.  Skin: Skin is warm and dry. No rash noted.  Psychiatric: Mood, memory, affect and judgment normal.  Nursing note and vitals reviewed.   LABORATORY DATA:  CBC    Component Value Date/Time   WBC 21.9* 09/27/2014 0848   WBC 7.6 09/05/2014 0914   RBC 3.81* 09/27/2014 0848   RBC 4.17 09/05/2014 0914   HGB 10.6* 09/27/2014 0848   HGB 11.7 09/05/2014 0914   HCT 32.7* 09/27/2014 0848  HCT 35.4 09/05/2014 0914   PLT 337 09/27/2014 0848   PLT 297 09/05/2014 0914   MCV 85.8 09/27/2014 0848   MCV 84.9 09/05/2014 0914   MCH 27.8 09/27/2014 0848   MCH 28.1 09/05/2014 0914   MCHC 32.4 09/27/2014 0848   MCHC 33.1 09/05/2014 0914   RDW 16.4* 09/27/2014 0848   RDW 14.9* 09/05/2014 0914   LYMPHSABS 0.9 09/27/2014 0848   LYMPHSABS 1.6 09/05/2014 0914   MONOABS 0.6 09/27/2014 0848   MONOABS 0.5 09/05/2014 0914   EOSABS 0.0 09/27/2014 0848   EOSABS 0.2 09/05/2014 0914   BASOSABS 0.0 09/27/2014 0848   BASOSABS 0.0 09/05/2014 0914   CMP     Component Value Date/Time   NA 139 09/27/2014 0848   NA 139 09/05/2014 0914   K 3.6 09/27/2014 0848   K 3.8 09/05/2014 0914   CL 107 09/27/2014 0848   CO2 24 09/27/2014 0848   CO2 29 09/05/2014 0914   GLUCOSE 159* 09/27/2014 0848   GLUCOSE 88 09/05/2014  0914   BUN 12 09/27/2014 0848   BUN 6.0* 09/05/2014 0914   CREATININE 0.78 09/27/2014 0848   CREATININE 0.8 09/05/2014 0914   CALCIUM 9.1 09/27/2014 0848   CALCIUM 9.3 09/05/2014 0914   PROT 6.3 09/27/2014 0848   PROT 6.6 09/05/2014 0914   ALBUMIN 3.5 09/27/2014 0848   ALBUMIN 3.4* 09/05/2014 0914   AST 31 09/27/2014 0848   AST 16 09/05/2014 0914   ALT 22 09/27/2014 0848   ALT 9 09/05/2014 0914   ALKPHOS 70 09/27/2014 0848   ALKPHOS 83 09/05/2014 0914   BILITOT 0.3 09/27/2014 0848   BILITOT 0.62 09/05/2014 0914   GFRNONAA >90 09/27/2014 0848   GFRAA >90 09/27/2014 0848     PENDING LABS:   RADIOGRAPHIC STUDIES:  07/06/2014 EXAM: CT CHEST, ABDOMEN, AND PELVIS WITH CONTRAST  IMPRESSION: CT CHEST IMPRESSION  No acute process or evidence of metastatic disease in the chest.  CT ABDOMEN AND PELVIS IMPRESSION  1. Isolated left upper quadrant "Mass" which is indeterminate. Given its density similarity to the spleen, splenule is suspected. Atypical isolated appearance of omental/peritoneal metastasis felt less likely. Consider confirmation of splenule with nuclear medicine damaged right blood cell or liver/ spleen MAA scan. 2. Otherwise, no evidence of metastatic disease within the abdomen or pelvis. 3. Small volume cul-de-sac fluid which could be physiologic. Given slight increased density within, question recent cyst or follicle rupture.   Electronically Signed  By: Abigail Miyamoto M.D.  On: 07/06/2014 13:29    ASSESSMENT and THERAPY PLAN:    Breast cancer of lower-inner quadrant of right female breast Pleasant 44 year old female with stage IA ER positive PR positive and HER-2 negative carcinoma of the right breast. She underwent mastectomy with immediate reconstruction. She continues to do well. She is here for her next cycle of Taxotere and Cytoxan. Her abdomen has still been quite sore since her surgery but she is making good progress in regards to healing  I discussed with the patient that we need to get her Zoladex restarted in preparation for aromatase inhibitor therapy after completion of chemotherapy.  She has no other questions or concerns today. I have advised her if she has ongoing issues with her bowels to let us know. Otherwise we will plan on return in 3 weeks prior to her next cycle of chemotherapy.    All questions were answered. The patient knows to call the clinic with any problems, questions or concerns. We can certainly see the patient  much sooner if necessary. Molli Hazard 10/11/2014

## 2014-10-18 ENCOUNTER — Encounter (HOSPITAL_BASED_OUTPATIENT_CLINIC_OR_DEPARTMENT_OTHER): Payer: 59

## 2014-10-18 ENCOUNTER — Encounter (HOSPITAL_BASED_OUTPATIENT_CLINIC_OR_DEPARTMENT_OTHER): Payer: 59 | Admitting: Hematology & Oncology

## 2014-10-18 ENCOUNTER — Encounter (HOSPITAL_COMMUNITY): Payer: Self-pay | Admitting: Hematology & Oncology

## 2014-10-18 ENCOUNTER — Ambulatory Visit (HOSPITAL_COMMUNITY): Payer: 59 | Admitting: Hematology & Oncology

## 2014-10-18 DIAGNOSIS — Z803 Family history of malignant neoplasm of breast: Secondary | ICD-10-CM

## 2014-10-18 DIAGNOSIS — Z5111 Encounter for antineoplastic chemotherapy: Secondary | ICD-10-CM

## 2014-10-18 DIAGNOSIS — C50311 Malignant neoplasm of lower-inner quadrant of right female breast: Secondary | ICD-10-CM

## 2014-10-18 LAB — COMPREHENSIVE METABOLIC PANEL
ALBUMIN: 3.7 g/dL (ref 3.5–5.2)
ALT: 22 U/L (ref 0–35)
ANION GAP: 9 (ref 5–15)
AST: 27 U/L (ref 0–37)
Alkaline Phosphatase: 70 U/L (ref 39–117)
BUN: 12 mg/dL (ref 6–23)
CALCIUM: 9.3 mg/dL (ref 8.4–10.5)
CHLORIDE: 107 mmol/L (ref 96–112)
CO2: 24 mmol/L (ref 19–32)
Creatinine, Ser: 0.78 mg/dL (ref 0.50–1.10)
GFR calc non Af Amer: >90 mL/min (ref >90)
Glucose, Bld: 177 mg/dL — ABNORMAL HIGH (ref 70–99)
Potassium: 3.9 mmol/L (ref 3.5–5.1)
Sodium: 140 mmol/L (ref 135–145)
Total Bilirubin: 0.4 mg/dL (ref 0.3–1.2)
Total Protein: 6.7 g/dL (ref 6.0–8.3)

## 2014-10-18 LAB — CBC WITH DIFFERENTIAL/PLATELET
Basophils Absolute: 0 10*3/uL (ref 0.0–0.1)
Basophils Relative: 0 % (ref 0–1)
EOS ABS: 0 10*3/uL (ref 0.0–0.7)
Eosinophils Relative: 0 % (ref 0–5)
HEMATOCRIT: 33.8 % — AB (ref 36.0–46.0)
HEMOGLOBIN: 11.1 g/dL — AB (ref 12.0–15.0)
Lymphocytes Relative: 3 % — ABNORMAL LOW (ref 12–46)
Lymphs Abs: 0.6 10*3/uL — ABNORMAL LOW (ref 0.7–4.0)
MCH: 28 pg (ref 26.0–34.0)
MCHC: 32.8 g/dL (ref 30.0–36.0)
MCV: 85.1 fL (ref 78.0–100.0)
MONO ABS: 0.2 10*3/uL (ref 0.1–1.0)
Monocytes Relative: 1 % — ABNORMAL LOW (ref 3–12)
Neutro Abs: 17.8 10*3/uL — ABNORMAL HIGH (ref 1.7–7.7)
Neutrophils Relative %: 96 % — ABNORMAL HIGH (ref 43–77)
Platelets: 309 10*3/uL (ref 150–400)
RBC: 3.97 MIL/uL (ref 3.87–5.11)
RDW: 17.8 % — ABNORMAL HIGH (ref 11.5–15.5)
WBC: 18.5 10*3/uL — AB (ref 4.0–10.5)

## 2014-10-18 MED ORDER — SODIUM CHLORIDE 0.9 % IV SOLN
Freq: Once | INTRAVENOUS | Status: AC
  Administered 2014-10-18: 09:00:00 via INTRAVENOUS

## 2014-10-18 MED ORDER — HEPARIN SOD (PORK) LOCK FLUSH 100 UNIT/ML IV SOLN
INTRAVENOUS | Status: AC
  Filled 2014-10-18: qty 5

## 2014-10-18 MED ORDER — HEPARIN SOD (PORK) LOCK FLUSH 100 UNIT/ML IV SOLN
500.0000 [IU] | Freq: Once | INTRAVENOUS | Status: AC | PRN
  Administered 2014-10-18: 500 [IU]

## 2014-10-18 MED ORDER — GOSERELIN ACETATE 3.6 MG ~~LOC~~ IMPL
3.6000 mg | DRUG_IMPLANT | Freq: Once | SUBCUTANEOUS | Status: AC
  Administered 2014-10-18: 3.6 mg via SUBCUTANEOUS
  Filled 2014-10-18: qty 3.6

## 2014-10-18 MED ORDER — SODIUM CHLORIDE 0.9 % IJ SOLN: 10.0000 mL | INTRAMUSCULAR | Status: DC | PRN

## 2014-10-18 MED ORDER — DOCETAXEL CHEMO INJECTION 160 MG/16ML
75.0000 mg/m2 | Freq: Once | INTRAVENOUS | Status: AC
  Administered 2014-10-18: 150 mg via INTRAVENOUS
  Filled 2014-10-18: qty 15

## 2014-10-18 MED ORDER — SODIUM CHLORIDE 0.9 % IV SOLN
600.0000 mg/m2 | Freq: Once | INTRAVENOUS | Status: AC
  Administered 2014-10-18: 1240 mg via INTRAVENOUS
  Filled 2014-10-18: qty 62

## 2014-10-18 MED ORDER — DEXAMETHASONE SODIUM PHOSPHATE 10 MG/ML IJ SOLN: 10.0000 mg | Freq: Once | INTRAMUSCULAR | Status: DC

## 2014-10-18 MED ORDER — SODIUM CHLORIDE 0.9 % IV SOLN
Freq: Once | INTRAVENOUS | Status: AC
  Administered 2014-10-18: 10:00:00 via INTRAVENOUS
  Filled 2014-10-18: qty 5

## 2014-10-18 MED ORDER — PALONOSETRON HCL INJECTION 0.25 MG/5ML
0.2500 mg | Freq: Once | INTRAVENOUS | Status: AC
  Administered 2014-10-18: 0.25 mg via INTRAVENOUS

## 2014-10-18 MED ORDER — DEXAMETHASONE 4 MG PO TABS: ORAL_TABLET | ORAL | Status: DC

## 2014-10-18 MED ORDER — FOSAPREPITANT DIMEGLUMINE INJECTION 150 MG: 150.0000 mg | Freq: Once | INTRAVENOUS | Status: DC

## 2014-10-18 MED ORDER — PALONOSETRON HCL INJECTION 0.25 MG/5ML
INTRAVENOUS | Status: AC
  Filled 2014-10-18: qty 5

## 2014-10-18 NOTE — Patient Instructions (Addendum)
[facility] Discharge Instructions for Patients Receiving Chemotherapy  Today you received the following chemotherapy agents: Taxotere and Cytoxan Today you received Xoladex; please call the clinic for symptoms related to this medication as discussed with Dr. Whitney Muse. Return in 3 weeks for office visit and chemotherapy.    If you develop nausea and vomiting, or diarrhea that is not controlled by your medication, call the clinic.  The clinic phone number is [phone]. Office hours are Monday-Friday 8:30am-5:00pm.  BELOW ARE SYMPTOMS THAT SHOULD BE REPORTED IMMEDIATELY:  *FEVER GREATER THAN 101.0 F  *CHILLS WITH OR WITHOUT FEVER  NAUSEA AND VOMITING THAT IS NOT CONTROLLED WITH YOUR NAUSEA MEDICATION  *UNUSUAL SHORTNESS OF BREATH  *UNUSUAL BRUISING OR BLEEDING  TENDERNESS IN MOUTH AND THROAT WITH OR WITHOUT PRESENCE OF ULCERS  *URINARY PROBLEMS  *BOWEL PROBLEMS  UNUSUAL RASH Items with * indicate a potential emergency and should be followed up as soon as possible. If you have an emergency after office hours please contact your primary care physician or go to the nearest emergency department.  Please call the clinic during office hours if you have any questions or concerns.   You may also contact the Patient Navigator at [phone] should you have any questions or need assistance in obtaining follow up care. _____________________________________________________________________ Have you asked about our STAR program?    STAR stands for Survivorship Training and Rehabilitation, and this is a nationally recognized cancer care program that focuses on survivorship and rehabilitation.  Cancer and cancer treatments may cause problems, such as, pain, making you feel tired and keeping you from doing the things that you need or want to do. Cancer rehabilitation can help. Our goal is to reduce these troubling effects and help you have the best quality of life  possible.  You may receive a survey from a nurse that asks questions about your current state of health.  Based on the survey results, all eligible patients will be referred to the [facility] program for an evaluation so we can better serve you! A frequently asked questions sheet is available upon request.          Goserelin injection What is this medicine? GOSERELIN (GOE se rel in) is similar to a hormone found in the body. It lowers the amount of sex hormones that the body makes. Men will have lower testosterone levels and women will have lower estrogen levels while taking this medicine. In men, this medicine is used to treat prostate cancer; the injection is either given once per month or once every 12 weeks. A once per month injection (only) is used to treat women with endometriosis, dysfunctional uterine bleeding, or advanced breast cancer. This medicine may be used for other purposes; ask your health care provider or pharmacist if you have questions. COMMON BRAND NAME(S): Zoladex What should I tell my health care provider before I take this medicine? They need to know if you have any of these conditions (some only apply to women): -diabetes -heart disease or previous heart attack -high blood pressure -high cholesterol -kidney disease -osteoporosis or low bone density -problems passing urine -spinal cord injury -stroke -tobacco smoker -an unusual or allergic reaction to goserelin, hormone therapy, other medicines, foods, dyes, or preservatives -pregnant or trying to get pregnant -breast-feeding How should I use this medicine? This medicine is for injection under the skin. It is given by a health care professional in a hospital or clinic setting. Men receive this injection once every 4 weeks or  once every 12 weeks. Women will only receive the once every 4 weeks injection. Talk to your pediatrician regarding the use of this medicine in children. Special care may be needed. Overdosage:  If you think you have taken too much of this medicine contact a poison control center or emergency room at once. NOTE: This medicine is only for you. Do not share this medicine with others. What if I miss a dose? It is important not to miss your dose. Call your doctor or health care professional if you are unable to keep an appointment. What may interact with this medicine? -female hormones like estrogen -herbal or dietary supplements like black cohosh, chasteberry, or DHEA -female hormones like testosterone -prasterone This list may not describe all possible interactions. Give your health care provider a list of all the medicines, herbs, non-prescription drugs, or dietary supplements you use. Also tell them if you smoke, drink alcohol, or use illegal drugs. Some items may interact with your medicine. What should I watch for while using this medicine? Visit your doctor or health care professional for regular checks on your progress. Your symptoms may appear to get worse during the first weeks of this therapy. Tell your doctor or healthcare professional if your symptoms do not start to get better or if they get worse after this time. Your bones may get weaker if you take this medicine for a long time. If you smoke or frequently drink alcohol you may increase your risk of bone loss. A family history of osteoporosis, chronic use of drugs for seizures (convulsions), or corticosteroids can also increase your risk of bone loss. Talk to your doctor about how to keep your bones strong. This medicine should stop regular monthly menstration in women. Tell your doctor if you continue to [facility]. Women should not become pregnant while taking this medicine or for 12 weeks after stopping this medicine. Women should inform their doctor if they wish to become pregnant or think they might be pregnant. There is a potential for serious side effects to an unborn child. Talk to your health care professional or pharmacist  for more information. Do not breast-feed an infant while taking this medicine. Men should inform their doctors if they wish to father a child. This medicine may lower sperm counts. Talk to your health care professional or pharmacist for more information. What side effects may I notice from receiving this medicine? Side effects that you should report to your doctor or health care professional as soon as possible: -allergic reactions like skin rash, itching or hives, swelling of the face, lips, or tongue -bone pain -breathing problems -changes in vision -chest pain -feeling faint or lightheaded, falls -fever, chills -pain, swelling, warmth in the leg -pain, tingling, numbness in the hands or feet -signs and symptoms of low blood pressure like dizziness; feeling faint or lightheaded, falls; unusually weak or tired -stomach pain -swelling of the ankles, feet, hands -trouble passing urine or change in the amount of urine -unusually high or low blood pressure -unusually weak or tired Side effects that usually do not require medical attention (report to your doctor or health care professional if they continue or are bothersome): -change in sex drive or performance -changes in breast size in both males and females -changes in emotions or moods -headache -hot flashes -irritation at site where injected -loss of appetite -skin problems like acne, dry skin -vaginal dryness This list may not describe all possible side effects. Call your doctor for medical advice about side effects.  You may report side effects to FDA at 1-800-FDA-1088. Where should I keep my medicine? This drug is given in a hospital or clinic and will not be stored at home. NOTE: This sheet is a summary. It may not cover all possible information. If you have questions about this medicine, talk to your doctor, pharmacist, or health care provider.  2015, Elsevier/Gold Standard. (2013-10-19 11:10:35)

## 2014-10-18 NOTE — Progress Notes (Signed)
[name] Tolerated chemotherapy well today.  Discharged ambulatory.  Juliana L Silas's reason for visit today is for an injection and labs as scheduled per MD orders.  [name] also received zoladex in abdomen sq per MD orders; see [facility] for administration details.  [name] tolerated all procedures well and without incident; questions were answered and patient was discharged.

## 2014-10-20 ENCOUNTER — Encounter (HOSPITAL_COMMUNITY): Payer: Self-pay

## 2014-10-20 ENCOUNTER — Encounter (HOSPITAL_BASED_OUTPATIENT_CLINIC_OR_DEPARTMENT_OTHER): Payer: 59

## 2014-10-20 DIAGNOSIS — Z5189 Encounter for other specified aftercare: Secondary | ICD-10-CM | POA: Diagnosis not present

## 2014-10-20 DIAGNOSIS — C50311 Malignant neoplasm of lower-inner quadrant of right female breast: Secondary | ICD-10-CM | POA: Diagnosis not present

## 2014-10-20 MED ORDER — PEGFILGRASTIM INJECTION 6 MG/0.6ML ~~LOC~~
PREFILLED_SYRINGE | SUBCUTANEOUS | Status: AC
  Filled 2014-10-20: qty 0.6

## 2014-10-20 MED ORDER — PEGFILGRASTIM INJECTION 6 MG/0.6ML ~~LOC~~
6.0000 mg | PREFILLED_SYRINGE | Freq: Once | SUBCUTANEOUS | Status: AC
  Administered 2014-10-20: 6 mg via SUBCUTANEOUS

## 2014-10-20 NOTE — Progress Notes (Signed)
Lydia Stevens presents today for injection per MD orders. Neulasta 6mg  administered SQ in right Abdomen. Administration without incident. Patient tolerated well. Reports being very tired after this treatment. Denies pain, one episode of nausea. She is encouraged to push fluids and call us tomorrow if no better.

## 2014-10-21 ENCOUNTER — Encounter: Payer: Self-pay | Admitting: Hematology and Oncology

## 2014-10-21 NOTE — Progress Notes (Signed)
Faxed infusion visits to [phone] for the patient's disability claim

## 2014-10-27 ENCOUNTER — Encounter (HOSPITAL_COMMUNITY): Payer: 59

## 2014-10-27 ENCOUNTER — Other Ambulatory Visit (HOSPITAL_COMMUNITY): Payer: Self-pay | Admitting: Hematology & Oncology

## 2014-10-27 ENCOUNTER — Encounter (HOSPITAL_COMMUNITY): Payer: 59 | Attending: Hematology & Oncology | Admitting: Hematology & Oncology

## 2014-10-27 ENCOUNTER — Encounter (HOSPITAL_COMMUNITY): Payer: Self-pay | Admitting: Hematology & Oncology

## 2014-10-27 VITALS — BP 99/61 | HR 109 | Temp 98.7°F | Resp 20 | Wt 187.0 lb

## 2014-10-27 DIAGNOSIS — L509 Urticaria, unspecified: Secondary | ICD-10-CM

## 2014-10-27 DIAGNOSIS — Z170 Estrogen receptor positive status [ER+]: Secondary | ICD-10-CM | POA: Insufficient documentation

## 2014-10-27 DIAGNOSIS — C50311 Malignant neoplasm of lower-inner quadrant of right female breast: Secondary | ICD-10-CM | POA: Insufficient documentation

## 2014-10-27 DIAGNOSIS — B370 Candidal stomatitis: Secondary | ICD-10-CM | POA: Insufficient documentation

## 2014-10-27 MED ORDER — DIPHENHYDRAMINE HCL 50 MG/ML IJ SOLN: 50.0000 mg | Freq: Once | INTRAMUSCULAR | Status: DC

## 2014-10-27 MED ORDER — METHYLPREDNISOLONE SODIUM SUCC 125 MG IJ SOLR: 60.0000 mg | Freq: Once | INTRAMUSCULAR | Status: DC

## 2014-10-27 MED ORDER — SODIUM CHLORIDE 0.9 % IV SOLN
INTRAVENOUS | Status: DC
Start: 2014-10-27 — End: 2014-10-27
  Administered 2014-10-27: 10:00:00 via INTRAVENOUS

## 2014-10-27 MED ORDER — DIPHENHYDRAMINE HCL 50 MG/ML IJ SOLN
INTRAMUSCULAR | Status: AC
  Filled 2014-10-27: qty 1

## 2014-10-27 MED ORDER — DIPHENHYDRAMINE HCL 50 MG/ML IJ SOLN
50.0000 mg | Freq: Once | INTRAMUSCULAR | Status: AC
  Administered 2014-10-27: 50 mg via INTRAVENOUS

## 2014-10-27 MED ORDER — METHYLPREDNISOLONE SODIUM SUCC 125 MG IJ SOLR
60.0000 mg | Freq: Once | INTRAMUSCULAR | Status: AC
  Administered 2014-10-27: 60 mg via INTRAVENOUS

## 2014-10-27 MED ORDER — METHYLPREDNISOLONE SODIUM SUCC 125 MG IJ SOLR
INTRAMUSCULAR | Status: AC
  Filled 2014-10-27: qty 2

## 2014-10-27 MED ORDER — HEPARIN SOD (PORK) LOCK FLUSH 100 UNIT/ML IV SOLN
500.0000 [IU] | Freq: Once | INTRAVENOUS | Status: AC
  Administered 2014-10-27: 500 [IU] via INTRAVENOUS

## 2014-10-27 NOTE — Assessment & Plan Note (Signed)
43-year-old female currently being treated for a stage I ER positive HER-2 negative breast cancer. She presents today with sudden onset hives, facial swelling, and itching. She has no shortness of breath. Other than receiving her chemotherapy last week and restarting Zoladex she has had no new exposures. She will be given Solu-Medrol and Benadryl in the clinic and she will be observed. If her symptoms abate she will then be advised to take Benadryl 25 mg every 6 hours for the next 24 hours. She was advised that if her symptoms return to call us or if it is during the evening or weekend hours to present to the emergency department at [place].  

## 2014-10-27 NOTE — Patient Instructions (Signed)
You had bendryl and steroids today.  Please take benadryl next 24 hours and call us if you have another reaction and if it is this weekend please go to the emergency room.

## 2014-10-27 NOTE — Assessment & Plan Note (Deleted)
43-year-old female currently being treated for a stage I ER positive HER-2 negative breast cancer. She presents today with sudden onset hives, facial swelling, and itching. She has no shortness of breath. Other than receiving her chemotherapy last week and restarting Zoladex she has had no new exposures. She will be given Solu-Medrol and Benadryl in the clinic and she will be observed. If her symptoms abate she will then be advised to take Benadryl 25 mg every 6 hours for the next 24 hours. She was advised that if her symptoms return to call us or if it is during the evening or weekend hours to present to the emergency department at .  

## 2014-10-27 NOTE — Patient Instructions (Signed)
[place] at [facility] Discharge Instructions  RECOMMENDATIONS MADE BY THE CONSULTANT AND ANY TEST RESULTS WILL BE SENT TO YOUR REFERRING PHYSICIAN.  Exam and discussion by Dr. Whitney Muse. Will give you benadryl and some solumedrol today. Take Benadryl 25 mg every 6 hours as needed. If rash/hives recur call us.  If occurs or worsens after hours or on weekend go to Emergency Department.  Follow-up as scheduled.  Thank you for choosing [place] at [facility] to provide your oncology and hematology care.  To afford each patient quality time with our provider, please arrive at least 15 minutes before your scheduled appointment time.    You need to re-schedule your appointment should you arrive 10 or more minutes late.  We strive to give you quality time with our providers, and arriving late affects you and other patients whose appointments are after yours.  Also, if you no show three or more times for appointments you may be dismissed from the clinic at the providers discretion.     Again, thank you for choosing [facility].  Our hope is that these requests will decrease the amount of time that you wait before being seen by our physicians.       _____________________________________________________________  Should you have questions after your visit to [facility], please contact our office at [phone] between the hours of 8:30 a.m. and 4:30 p.m.  Voicemails left after 4:30 p.m. will not be returned until the following business day.  For prescription refill requests, have your pharmacy contact our office.

## 2014-10-27 NOTE — Progress Notes (Signed)
Lydia Stevens tolerated fluids and benadryl and steroids.  Pt felt much better after and VS stable.  Pt discharged ambulatory.

## 2014-10-27 NOTE — Progress Notes (Signed)
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 N0 M0 stage IA  ER+ (100%), PR+ (71%), Her-2 negative  Core needle biopsy of L breast lesion c/w fibroadenoma  Oncotype DX score 24, 16% risk of recurrence  Monthly Zoladex  CURRENT THERAPY: To Start TC 09/06/2013        Monthly Zoladex for ovarian suppression  INTERVAL HISTORY: Lydia Stevens comes in today as a work in secondary to severe itching and a rash. She woke up this morning noticing that her face and left arm were extremely itchy. She then noticed a rash over the left upper extremity on her face. She also noted significant eyes swelling and itchiness. She denies any new medications in the last several days, no change in laundry soaps, she denies any change in her normal routine.  MEDICAL HISTORY: Past Medical History  Diagnosis Date  . Scoliosis   . Breast cancer     2015  . Anemia     before     has Breast cancer of lower-inner quadrant of right female breast; Genetic testing; Oral candidiasis; and Hives on her problem list.      Breast cancer of lower-inner quadrant of right female breast   06/07/2014 Initial Biopsy Right breast needle biopsy 5:00 position: Invasive ductal carcinoma with DCIS, ER 100%, PR 71%, Ki-67 33%, HER-2 negative ratio 1.03   06/17/2014 Breast MRI Right breast: 10 x 7 x 5 mm biopsy-proven IDC with DCIS, left breast 7 x 7 x 7 mm fibroadenoma, left upper quadrant of the abdomen abutting the peritoneum 3 x 1.1 cm oval soft tissue mass   07/27/2014 Surgery Right breast mastectomy: Invasive ductal carcinoma grade 3, 2 cm, intermediate grade DCIS, lymphovascular invasion identified, 2 SLN negative, T1 C. N0 M0 stage IA, ER positive, PR 7%, HER-2 negative, Ki-67 33%   07/27/2014  Oncotype testing Recurrence Score of 24, placing patient in the intermediate risk group   09/06/2014 -  Chemotherapy Taxotere/Cytoxan    09/08/2014 Adverse Reaction Nasuea and voming three times x 2 days.  Added Aloxi and Emend to anti-emetic regimen.   Breast cancer risk profile:  She menarched at age of 43  She had one pregnancy, her first child was born at age 43  She is currently on Depo Provera shots for unknown time in the [facility] She was never exposed to fertility medications or hormone replacement therapy.  She has family history of Breast/GYN/GI cancer; 2 half-sisters with breast cancer    has No Known Allergies.  We administered diphenhydrAMINE, methylPREDNISolone sodium succinate, heparin lock flush, and sodium chloride.  SURGICAL HISTORY: Past Surgical History  Procedure Laterality Date  . Cesarean section  1996  . Wisdom tooth extraction    . Mastectomy w/ sentinel node biopsy Right 07/27/2014    Procedure: RIGHT MASTECTOMY WITH RIGHT AXILLARY SENTINEL LYMPH NODE BIOPSY;  Surgeon: Alphonsa Overall, MD;  Location: [place];  Service: General;  Laterality: Right;  . Latissimus flap to breast Right 07/27/2014    Procedure: TRAM FLAP RECONSTUCTION RIGHT CHEST;  Surgeon: Irene Limbo, MD;  Location: [place];  Service: Plastics;  Laterality: Right;  . Portacath placement Left 09/05/2013    SOCIAL HISTORY: History   Social History  . Marital Status: Married    Spouse Name:  N/A  . Number of Children: 1  . Years of Education: N/A   Occupational History  . Not on file.   Social History Main Topics  . Smoking status: Never Smoker   . Smokeless tobacco: Never Used  . Alcohol Use: No  . Drug Use: No  . Sexual Activity: Not on file   Other Topics Concern  . Not on file   Social History Narrative    FAMILY HISTORY: Family History  Problem Relation Age of Onset  . Breast cancer Paternal Aunt 22  . Heart attack Father   . Breast cancer Maternal Aunt 27  .  Breast cancer Sister 33  . Breast cancer Maternal Aunt 54    Review of Systems  Constitutional: Negative.   HENT: Negative.   Eyes: Positive for redness.       Eye swelling  Respiratory: Negative.   Cardiovascular: Negative.   Gastrointestinal: Negative.   Genitourinary: Negative.   Musculoskeletal: Negative.   Skin: Positive for itching and rash.  Neurological: Negative.   Endo/Heme/Allergies: Negative.   Psychiatric/Behavioral: Negative.     PHYSICAL EXAMINATION  ECOG PERFORMANCE STATUS: 0 - Asymptomatic  There were no vitals filed for this visit.  Physical Exam  Constitutional: She is oriented to person, place, and time and well-developed, well-nourished, and in no distress.  HENT:  Head: Normocephalic and atraumatic.  Nose: Nose normal.  Mouth/Throat: Oropharynx is clear and moist. No oropharyngeal exudate.  Eye swelling and mild erythema, no drainage  Eyes: Conjunctivae and EOM are normal. Pupils are equal, round, and reactive to light. Right eye exhibits no discharge. Left eye exhibits no discharge. No scleral icterus.  Neck: Normal range of motion. Neck supple. No tracheal deviation present. No thyromegaly present.  Cardiovascular: Normal rate, regular rhythm and normal heart sounds.  Exam reveals no gallop and no friction rub.   No murmur heard. Pulmonary/Chest: Effort normal and breath sounds normal. She has no wheezes. She has no rales.  Lymphadenopathy:    She has no cervical adenopathy.  Neurological: She is alert and oriented to person, place, and time. She has normal reflexes. No cranial nerve deficit. Coordination normal.  Skin: Skin is warm and dry. Rash noted.  Hives over posterior L arm and shoulder, hives on face and neck  Psychiatric: Mood, memory, affect and judgment normal.  Nursing note and vitals reviewed.   LABORATORY DATA:  CBC    Component Value Date/Time   WBC 18.5* 10/18/2014 0853   WBC 7.6 09/05/2014 0914   RBC 3.97 10/18/2014 0853     RBC 4.17 09/05/2014 0914   HGB 11.1* 10/18/2014 0853   HGB 11.7 09/05/2014 0914   HCT 33.8* 10/18/2014 0853   HCT 35.4 09/05/2014 0914   PLT 309 10/18/2014 0853   PLT 297 09/05/2014 0914   MCV 85.1 10/18/2014 0853   MCV 84.9 09/05/2014 0914   MCH 28.0 10/18/2014 0853   MCH 28.1 09/05/2014 0914   MCHC 32.8 10/18/2014 0853   MCHC 33.1 09/05/2014 0914   RDW 17.8* 10/18/2014 0853   RDW 14.9* 09/05/2014 0914   LYMPHSABS 0.6* 10/18/2014 0853   LYMPHSABS 1.6 09/05/2014 0914   MONOABS 0.2 10/18/2014 0853   MONOABS 0.5 09/05/2014 0914   EOSABS 0.0 10/18/2014 0853   EOSABS 0.2 09/05/2014 0914   BASOSABS 0.0 10/18/2014 0853   BASOSABS 0.0 09/05/2014 0914   CMP     Component Value Date/Time   NA 140 10/18/2014 0853   NA 139 09/05/2014 0914  K 3.9 10/18/2014 0853   K 3.8 09/05/2014 0914   CL 107 10/18/2014 0853   CO2 24 10/18/2014 0853   CO2 29 09/05/2014 0914   GLUCOSE 177* 10/18/2014 0853   GLUCOSE 88 09/05/2014 0914   BUN 12 10/18/2014 0853   BUN 6.0* 09/05/2014 0914   CREATININE 0.78 10/18/2014 0853   CREATININE 0.8 09/05/2014 0914   CALCIUM 9.3 10/18/2014 0853   CALCIUM 9.3 09/05/2014 0914   PROT 6.7 10/18/2014 0853   PROT 6.6 09/05/2014 0914   ALBUMIN 3.7 10/18/2014 0853   ALBUMIN 3.4* 09/05/2014 0914   AST 27 10/18/2014 0853   AST 16 09/05/2014 0914   ALT 22 10/18/2014 0853   ALT 9 09/05/2014 0914   ALKPHOS 70 10/18/2014 0853   ALKPHOS 83 09/05/2014 0914   BILITOT 0.4 10/18/2014 0853   BILITOT 0.62 09/05/2014 0914   GFRNONAA >90 10/18/2014 0853   GFRAA >90 10/18/2014 0853       ASSESSMENT and THERAPY PLAN:    Hives 44 year old female currently being treated for a stage I ER positive HER-2 negative breast cancer. She presents today with sudden onset hives, facial swelling, and itching. She has no shortness of breath. Other than receiving her chemotherapy last week and restarting Zoladex she has had no new exposures. She will be given Solu-Medrol and  Benadryl in the clinic and she will be observed. If her symptoms abate she will then be advised to take Benadryl 25 mg every 6 hours for the next 24 hours. She was advised that if her symptoms return to call us or if it is during the evening or weekend hours to present to the emergency department at [facility].    All questions were answered. The patient knows to call the clinic with any problems, questions or concerns. We can certainly see the patient much sooner if necessary. Molli Hazard 10/27/2014

## 2014-10-31 ENCOUNTER — Encounter (HOSPITAL_COMMUNITY): Payer: Self-pay

## 2014-10-31 ENCOUNTER — Encounter (HOSPITAL_BASED_OUTPATIENT_CLINIC_OR_DEPARTMENT_OTHER): Payer: 59

## 2014-10-31 ENCOUNTER — Telehealth (HOSPITAL_COMMUNITY): Payer: Self-pay | Admitting: Oncology

## 2014-10-31 VITALS — BP 106/67 | HR 92 | Temp 98.4°F | Resp 18

## 2014-10-31 DIAGNOSIS — L509 Urticaria, unspecified: Secondary | ICD-10-CM

## 2014-10-31 DIAGNOSIS — C50311 Malignant neoplasm of lower-inner quadrant of right female breast: Secondary | ICD-10-CM

## 2014-10-31 MED ORDER — PREDNISONE 10 MG PO TABS: ORAL_TABLET | ORAL | Status: DC

## 2014-10-31 MED ORDER — METHYLPREDNISOLONE SODIUM SUCC 125 MG IJ SOLR
60.0000 mg | Freq: Once | INTRAMUSCULAR | Status: AC
  Administered 2014-10-31: 60 mg via INTRAVENOUS
  Filled 2014-10-31: qty 2

## 2014-10-31 MED ORDER — SODIUM CHLORIDE 0.9 % IV SOLN
Freq: Once | INTRAVENOUS | Status: AC
  Administered 2014-10-31: 10:00:00 via INTRAVENOUS

## 2014-10-31 MED ORDER — DIPHENHYDRAMINE HCL 50 MG/ML IJ SOLN
25.0000 mg | Freq: Once | INTRAMUSCULAR | Status: AC
  Administered 2014-10-31: 25 mg via INTRAVENOUS
  Filled 2014-10-31: qty 1

## 2014-10-31 MED ORDER — HEPARIN SOD (PORK) LOCK FLUSH 100 UNIT/ML IV SOLN
INTRAVENOUS | Status: AC
  Filled 2014-10-31: qty 5

## 2014-10-31 MED ORDER — CIPROFLOXACIN HCL 0.3 % OP SOLN: OPHTHALMIC | Status: DC

## 2014-10-31 NOTE — Telephone Encounter (Signed)
error 

## 2014-10-31 NOTE — Patient Instructions (Signed)
Apply warm compresses to your eye 3-4 times a day We will make a referral to an eye Dr  And call you with that date and time  Antibiotic eye gtts and prednisone prescribed per Dr. Whitney Muse

## 2014-10-31 NOTE — Progress Notes (Signed)
Tolerated fluids, benadryl and solumedrol well. Continues to have itching, hives. Denies shortness of breath or wheezing. Prednisone and antibiotic eye gtts to be prescribed per Dr. Whitney Muse and patient knows to pick up on her way home. Will call us tomorrow to let us know how she is doing.

## 2014-11-03 ENCOUNTER — Ambulatory Visit (HOSPITAL_COMMUNITY): Payer: 59 | Admitting: Hematology & Oncology

## 2014-11-08 ENCOUNTER — Encounter (HOSPITAL_BASED_OUTPATIENT_CLINIC_OR_DEPARTMENT_OTHER): Payer: 59

## 2014-11-08 ENCOUNTER — Encounter (HOSPITAL_COMMUNITY): Payer: Self-pay | Admitting: Oncology

## 2014-11-08 ENCOUNTER — Encounter (HOSPITAL_BASED_OUTPATIENT_CLINIC_OR_DEPARTMENT_OTHER): Payer: 59 | Admitting: Oncology

## 2014-11-08 DIAGNOSIS — Z5111 Encounter for antineoplastic chemotherapy: Secondary | ICD-10-CM

## 2014-11-08 DIAGNOSIS — B370 Candidal stomatitis: Secondary | ICD-10-CM | POA: Diagnosis not present

## 2014-11-08 DIAGNOSIS — C50311 Malignant neoplasm of lower-inner quadrant of right female breast: Secondary | ICD-10-CM

## 2014-11-08 DIAGNOSIS — Z170 Estrogen receptor positive status [ER+]: Secondary | ICD-10-CM | POA: Diagnosis not present

## 2014-11-08 LAB — CBC WITH DIFFERENTIAL/PLATELET
BASOS ABS: 0 10*3/uL (ref 0.0–0.1)
Basophils Relative: 0 % (ref 0–1)
Eosinophils Absolute: 0 10*3/uL (ref 0.0–0.7)
Eosinophils Relative: 0 % (ref 0–5)
HEMATOCRIT: 31.5 % — AB (ref 36.0–46.0)
Hemoglobin: 10.5 g/dL — ABNORMAL LOW (ref 12.0–15.0)
LYMPHS ABS: 0.5 10*3/uL — AB (ref 0.7–4.0)
Lymphocytes Relative: 2 % — ABNORMAL LOW (ref 12–46)
MCH: 28.3 pg (ref 26.0–34.0)
MCHC: 33.3 g/dL (ref 30.0–36.0)
MCV: 84.9 fL (ref 78.0–100.0)
Monocytes Absolute: 0.9 10*3/uL (ref 0.1–1.0)
Monocytes Relative: 3 % (ref 3–12)
NEUTROS ABS: 25.2 10*3/uL — AB (ref 1.7–7.7)
Neutrophils Relative %: 95 % — ABNORMAL HIGH (ref 43–77)
Platelets: 305 10*3/uL (ref 150–400)
RBC: 3.71 MIL/uL — ABNORMAL LOW (ref 3.87–5.11)
RDW: 19.9 % — AB (ref 11.5–15.5)
WBC: 26.6 10*3/uL — ABNORMAL HIGH (ref 4.0–10.5)

## 2014-11-08 LAB — COMPREHENSIVE METABOLIC PANEL
ALK PHOS: 74 U/L (ref 39–117)
ALT: 14 U/L (ref 0–35)
AST: 20 U/L (ref 0–37)
Albumin: 3.3 g/dL — ABNORMAL LOW (ref 3.5–5.2)
Anion gap: 9 (ref 5–15)
BILIRUBIN TOTAL: 0.3 mg/dL (ref 0.3–1.2)
BUN: 14 mg/dL (ref 6–23)
CALCIUM: 9 mg/dL (ref 8.4–10.5)
CO2: 26 mmol/L (ref 19–32)
Chloride: 106 mmol/L (ref 96–112)
Creatinine, Ser: 0.79 mg/dL (ref 0.50–1.10)
GFR calc Af Amer: >90 mL/min (ref >90)
GFR calc non Af Amer: >90 mL/min (ref >90)
GLUCOSE: 142 mg/dL — AB (ref 70–99)
POTASSIUM: 3.6 mmol/L (ref 3.5–5.1)
SODIUM: 141 mmol/L (ref 135–145)
Total Protein: 6.1 g/dL (ref 6.0–8.3)

## 2014-11-08 MED ORDER — SODIUM CHLORIDE 0.9 % IV SOLN
600.0000 mg/m2 | Freq: Once | INTRAVENOUS | Status: AC
  Administered 2014-11-08: 1240 mg via INTRAVENOUS
  Filled 2014-11-08: qty 50

## 2014-11-08 MED ORDER — TAMOXIFEN CITRATE 20 MG PO TABS: 20.0000 mg | ORAL_TABLET | Freq: Every day | ORAL | Status: DC

## 2014-11-08 MED ORDER — DEXAMETHASONE SODIUM PHOSPHATE 10 MG/ML IJ SOLN: 10.0000 mg | Freq: Once | INTRAMUSCULAR | Status: DC

## 2014-11-08 MED ORDER — SODIUM CHLORIDE 0.9 % IJ SOLN
10.0000 mL | INTRAMUSCULAR | Status: DC | PRN
  Administered 2014-11-08: 10 mL
  Filled 2014-11-08: qty 10

## 2014-11-08 MED ORDER — PALONOSETRON HCL INJECTION 0.25 MG/5ML
0.2500 mg | Freq: Once | INTRAVENOUS | Status: AC
  Administered 2014-11-08: 0.25 mg via INTRAVENOUS
  Filled 2014-11-08: qty 5

## 2014-11-08 MED ORDER — SODIUM CHLORIDE 0.9 % IV SOLN
Freq: Once | INTRAVENOUS | Status: AC
  Administered 2014-11-08: 11:00:00 via INTRAVENOUS

## 2014-11-08 MED ORDER — HEPARIN SOD (PORK) LOCK FLUSH 100 UNIT/ML IV SOLN
500.0000 [IU] | Freq: Once | INTRAVENOUS | Status: AC | PRN
  Administered 2014-11-08: 500 [IU]
  Filled 2014-11-08: qty 5

## 2014-11-08 MED ORDER — SODIUM CHLORIDE 0.9 % IV SOLN
Freq: Once | INTRAVENOUS | Status: AC
  Administered 2014-11-08: 11:00:00 via INTRAVENOUS
  Filled 2014-11-08: qty 5

## 2014-11-08 MED ORDER — SODIUM CHLORIDE 0.9 % IV SOLN: 150.0000 mg | Freq: Once | INTRAVENOUS | Status: DC

## 2014-11-08 MED ORDER — DOCETAXEL CHEMO INJECTION 160 MG/16ML
75.0000 mg/m2 | Freq: Once | INTRAVENOUS | Status: AC
  Administered 2014-11-08: 150 mg via INTRAVENOUS
  Filled 2014-11-08: qty 15

## 2014-11-08 NOTE — Progress Notes (Signed)
No PCP Per Patient No address on file  Breast cancer of lower-inner quadrant of right female breast  CURRENT THERAPY: TC chemotherapy, here for cycle #4.  Additionally on monthly Zoladex for Ovarian suppression.  INTERVAL HISTORY: Lydia Stevens 44 y.o. female returns for followup of Stage IA (T1C,N0,M0) right breast invasive ductal carcinoma (with associated DCIS) status post mastectomy 07/27/2014 followed by reconstruction with TRAM flap.  Tumor was 2 cm in size with intermediate grade DCIS, lymphovascular invasion noted, 2 SLN negative, grade 3.  ER+ (100%), PR+ (71%), Her-2 negative.Core needle biopsy of L breast lesion c/w fibroadenoma. Oncotype DX score 24, 16% risk of recurrence.    Breast cancer of lower-inner quadrant of right female breast   06/07/2014 Initial Biopsy Right breast needle biopsy 5:00 position: Invasive ductal carcinoma with DCIS, ER 100%, PR 71%, Ki-67 33%, HER-2 negative ratio 1.03   06/17/2014 Breast MRI Right breast: 10 x 7 x 5 mm biopsy-proven IDC with DCIS, left breast 7 x 7 x 7 mm fibroadenoma, left upper quadrant of the abdomen abutting the peritoneum 3 x 1.1 cm oval soft tissue mass   07/27/2014 Surgery Right breast mastectomy: Invasive ductal carcinoma grade 3, 2 cm, intermediate grade DCIS, lymphovascular invasion identified, 2 SLN negative, T1 C. N0 M0 stage IA, ER positive, PR 7%, HER-2 negative, Ki-67 33%   07/27/2014 Oncotype testing Recurrence Score of 24, placing patient in the intermediate risk group   09/06/2014 -  Chemotherapy Taxotere/Cytoxan    09/08/2014 Adverse Reaction Nasuea and voming three times x 2 days.  Added Aloxi and Emend to anti-emetic regimen.   10/18/2014 -  Chemotherapy Zoladex   10/27/2014 Adverse Reaction Hives, secondary to Zoladex?    I personally reviewed and went over laboratory results with the patient.  The results are noted within this dictation.  Chart reviewed.  Episode of severe itching and rash observed  requiring her to come into the clinic for evaluation.  Suspect it was secondary to Zoladex.  Therefore, we will not rechallange her with this medication and instead, we will treat with Tamoxifen since she is pre-menopausal.  I provided her education regarding Tamoxifen.  She asked about birth control options and she is educated about barrier protection, such as condoms, but for other options, I will defer this for 6 weeks until she returns.  It is important to establish tolerability of Tamoxifen before discussing further birth control options.  Other options include Progesterone containing products and/or TAH-BSO which would allow [name] to use AI therapy which has been shown to be superior than Tamoxifen per SOFT trial.   She is excited that this is her last treatment and she is encouraged to ring our [employer].    Oncologically, she denies any complaints and ROS questioning is negative.    Past Medical History  Diagnosis Date  . Scoliosis   . Breast cancer     2015  . Anemia     before   . Hives Mar 3rd 2016    has Breast cancer of lower-inner quadrant of right female breast; Genetic testing; Oral candidiasis; and Hives on her problem list.     has No Known Allergies.  Ms. Azimi does not currently have medications on file.  Past Surgical History  Procedure Laterality Date  . Cesarean section  1996  . Wisdom tooth extraction    . Mastectomy w/ sentinel node biopsy Right 07/27/2014    Procedure: RIGHT MASTECTOMY WITH RIGHT AXILLARY SENTINEL  LYMPH NODE BIOPSY;  Surgeon: Alphonsa Overall, MD;  Location: [place];  Service: General;  Laterality: Right;  . Latissimus flap to breast Right 07/27/2014    Procedure: TRAM FLAP RECONSTUCTION RIGHT CHEST;  Surgeon: Irene Limbo, MD;  Location: [place];  Service: Plastics;  Laterality: Right;  . Portacath placement Left 09/05/2013    Denies any headaches, dizziness, double vision, fevers, chills, night sweats, nausea, vomiting, diarrhea,  constipation, chest pain, heart palpitations, shortness of breath, blood in stool, black tarry stool, urinary pain, urinary burning, urinary frequency, hematuria.   PHYSICAL EXAMINATION  ECOG PERFORMANCE STATUS: 0 - Asymptomatic  There were no vitals filed for this visit.  GENERAL:alert, healthy, no distress, well nourished, well developed, comfortable, cooperative and smiling, accompanied by her husband. SKIN: skin color, texture, turgor are normal, no rashes or significant lesions HEAD: Normocephalic, No masses, lesions, tenderness or abnormalities EYES: normal, PERRLA, EOMI, Conjunctiva are pink and non-injected EARS: External ears normal OROPHARYNX:lips, buccal mucosa, and tongue normal and mucous membranes are moist  NECK: supple, trachea midline LYMPH:  no palpable lymphadenopathy BREAST:not examined LUNGS: clear to auscultation  HEART: regular rate & rhythm, no murmurs and no gallops ABDOMEN:abdomen soft, non-tender and normal bowel sounds BACK: Back symmetric, no curvature. EXTREMITIES:less then 2 second capillary refill, no joint deformities, effusion, or inflammation, no edema, no skin discoloration, no clubbing, no cyanosis  NEURO: alert & oriented x 3 with fluent speech, no focal motor/sensory deficits, gait normal   LABORATORY DATA: CBC    Component Value Date/Time   WBC 18.5* 10/18/2014 0853   WBC 7.6 09/05/2014 0914   RBC 3.97 10/18/2014 0853   RBC 4.17 09/05/2014 0914   HGB 11.1* 10/18/2014 0853   HGB 11.7 09/05/2014 0914   HCT 33.8* 10/18/2014 0853   HCT 35.4 09/05/2014 0914   PLT 309 10/18/2014 0853   PLT 297 09/05/2014 0914   MCV 85.1 10/18/2014 0853   MCV 84.9 09/05/2014 0914   MCH 28.0 10/18/2014 0853   MCH 28.1 09/05/2014 0914   MCHC 32.8 10/18/2014 0853   MCHC 33.1 09/05/2014 0914   RDW 17.8* 10/18/2014 0853   RDW 14.9* 09/05/2014 0914   LYMPHSABS 0.6* 10/18/2014 0853   LYMPHSABS 1.6 09/05/2014 0914   MONOABS 0.2 10/18/2014 0853   MONOABS 0.5  09/05/2014 0914   EOSABS 0.0 10/18/2014 0853   EOSABS 0.2 09/05/2014 0914   BASOSABS 0.0 10/18/2014 0853   BASOSABS 0.0 09/05/2014 0914      Chemistry      Component Value Date/Time   NA 140 10/18/2014 0853   NA 139 09/05/2014 0914   K 3.9 10/18/2014 0853   K 3.8 09/05/2014 0914   CL 107 10/18/2014 0853   CO2 24 10/18/2014 0853   CO2 29 09/05/2014 0914   BUN 12 10/18/2014 0853   BUN 6.0* 09/05/2014 0914   CREATININE 0.78 10/18/2014 0853   CREATININE 0.8 09/05/2014 0914      Component Value Date/Time   CALCIUM 9.3 10/18/2014 0853   CALCIUM 9.3 09/05/2014 0914   ALKPHOS 70 10/18/2014 0853   ALKPHOS 83 09/05/2014 0914   AST 27 10/18/2014 0853   AST 16 09/05/2014 0914   ALT 22 10/18/2014 0853   ALT 9 09/05/2014 0914   BILITOT 0.4 10/18/2014 0853   BILITOT 0.62 09/05/2014 0914       ASSESSMENT AND PLAN:  Breast cancer of lower-inner quadrant of right female breast Stage IA (T1C,N0,M0) right breast invasive ductal carcinoma (with associated DCIS) status post mastectomy  07/27/2014 followed by reconstruction with TRAM flap.  Tumor was 2 cm in size with intermediate grade DCIS, lymphovascular invasion noted, 2 SLN negative, grade 3.  ER+ (100%), PR+ (71%), Her-2 negative.Core needle biopsy of L breast lesion c/w fibroadenoma. Oncotype DX score 24, 16% risk of recurrence. Starting cycle #4 of TC today and began Zoladex for Ovarian Suppression on 10/18/2014 with a possible reaction consisting of severe hives.  We will not re-challenge her with Zoladex.  Pre-chemo labs today.  An Rx for Tamoxifen is provided today and she is to start 2-3 weeks from now.  Return in 6 weeks for labs: CBC diff, CMET and to evaluate tolerability of Tamoxifen.   THERAPY PLAN:  Continue with therapy as planned.  All questions were answered. The patient knows to call the clinic with any problems, questions or concerns. We can certainly see the patient much sooner if necessary.  Patient and plan discussed  with Dr. Ancil Linsey and she is in agreement with the aforementioned.   This note is electronically signed by: Robynn Pane 11/08/2014 9:47 AM

## 2014-11-08 NOTE — Assessment & Plan Note (Signed)
44 year old female with invasive carcinoma the right breast ER positive, HER-2 negative. She had associated intermediate grade DCIS. She is currently receiving treatment with Taxotere and Cytoxan. She is doing remarkably well. We are going to start her back on Zoladex today. The plan moving forward will be to initiate AI therapy. We will plan on seeing her back again in 3 weeks for her final cycle of chemotherapy and sooner if needed.

## 2014-11-08 NOTE — Progress Notes (Signed)
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 N0 M0 stage IA  ER+ (100%), PR+ (71%), Her-2 negative  Core needle biopsy of L breast lesion c/w fibroadenoma  Oncotype DX score 24, 16% risk of recurrence  Monthly Zoladex  CURRENT THERAPY: To Start TC 09/06/2013        Monthly Zoladex for ovarian suppression  INTERVAL HISTORY: Lydia Stevens 44 y.o. female returns for follow-up of stage I ER positive, PR positive, and HER-2 negative carcinoma of the right breast. She is here today for her next cycle of Taxotere and Cytoxan. She is due for Zoladex today. It has been held secondary to her reconstructive surgery and healing abdominal incision sites. She is doing remarkably well. She has no complaints today and is ready to proceed with treatment.  MEDICAL HISTORY: Past Medical History  Diagnosis Date  . Scoliosis   . Breast cancer     2015  . Anemia     before   . Hives Mar 3rd 2016    has Breast cancer of lower-inner quadrant of right female breast; Genetic testing; Oral candidiasis; and Hives on her problem list.      Breast cancer of lower-inner quadrant of right female breast   06/07/2014 Initial Biopsy Right breast needle biopsy 5:00 position: Invasive ductal carcinoma with DCIS, ER 100%, PR 71%, Ki-67 33%, HER-2 negative ratio 1.03   06/17/2014 Breast MRI Right breast: 10 x 7 x 5 mm biopsy-proven IDC with DCIS, left breast 7 x 7 x 7 mm fibroadenoma, left upper quadrant of the abdomen abutting the peritoneum 3 x 1.1 cm oval soft tissue mass   07/27/2014 Surgery Right breast mastectomy: Invasive ductal carcinoma grade 3, 2 cm, intermediate grade DCIS, lymphovascular invasion identified, 2 SLN negative, T1 C. N0 M0 stage IA, ER positive, PR  7%, HER-2 negative, Ki-67 33%   07/27/2014 Oncotype testing Recurrence Score of 24, placing patient in the intermediate risk group   09/06/2014 -  Chemotherapy Taxotere/Cytoxan    09/08/2014 Adverse Reaction Nasuea and voming three times x 2 days.  Added Aloxi and Emend to anti-emetic regimen.   10/18/2014 -  Chemotherapy Zoladex   10/27/2014 Adverse Reaction Hives, secondary to Zoladex?   Breast cancer risk profile:  She menarched at early age of 55  She had one pregnancy, her first child was born at age 51  She is currently on Depo Provera shots for unknown time in the [facility] She was never exposed to fertility medications or hormone replacement therapy.  She has family history of Breast/GYN/GI cancer; 2 half-sisters with breast cancer    has No Known Allergies.  [name] had no medications administered during this visit.  SURGICAL HISTORY: Past Surgical History  Procedure Laterality Date  . Cesarean section  1996  . Wisdom tooth extraction    . Mastectomy w/ sentinel node biopsy Right 07/27/2014    Procedure: RIGHT MASTECTOMY WITH RIGHT AXILLARY SENTINEL LYMPH NODE BIOPSY;  Surgeon: Alphonsa Overall, MD;  Location: [facility];  Service: General;  Laterality: Right;  . Latissimus flap to breast Right 07/27/2014    Procedure: TRAM FLAP RECONSTUCTION RIGHT CHEST;  Surgeon: Irene Limbo, MD;  Location: [facility];  Service: Plastics;  Laterality: Right;  . Portacath placement Left 09/05/2013  SOCIAL HISTORY: History   Social History  . Marital Status: Married    Spouse Name: N/A  . Number of Children: 1  . Years of Education: N/A   Occupational History  . Not on file.   Social History Main Topics  . Smoking status: Never Smoker   . Smokeless tobacco: Never Used  . Alcohol Use: No  . Drug Use: No  . Sexual Activity: Not on file   Other Topics Concern  . Not on file   Social History Narrative    FAMILY HISTORY: Family History  Problem Relation Age of Onset  .  Breast cancer Paternal Aunt 85  . Heart attack Father   . Breast cancer Maternal Aunt 38  . Breast cancer Sister 79  . Breast cancer Maternal Aunt 54    Review of Systems  Constitutional: Negative for fever, chills, weight loss and malaise/fatigue.  HENT: Negative for congestion, hearing loss, nosebleeds, sore throat and tinnitus.   Eyes: Negative for blurred vision, double vision, pain and discharge.  Respiratory: Negative for cough, hemoptysis, sputum production, shortness of breath and wheezing.   Cardiovascular: Negative for chest pain, palpitations, claudication, leg swelling and PND.  Gastrointestinal: Negative for heartburn, nausea, vomiting, abdominal pain, diarrhea, constipation, blood in stool and melena.  Genitourinary: Negative for dysuria, urgency, frequency and hematuria.  Musculoskeletal: Negative for myalgias, joint pain and falls.  Skin: Negative for itching and rash.  Neurological: Negative for dizziness, tingling, tremors, sensory change, speech change, focal weakness, seizures, loss of consciousness, weakness and headaches.  Endo/Heme/Allergies: Does not bruise/bleed easily.  Psychiatric/Behavioral: Negative for depression, suicidal ideas, memory loss and substance abuse. The patient is not nervous/anxious and does not have insomnia.     PHYSICAL EXAMINATION  ECOG PERFORMANCE STATUS: 0 - Asymptomatic  There were no vitals filed for this visit.  Physical Exam  Constitutional: She is oriented to person, place, and time and well-developed, well-nourished, and in no distress.  HENT:  Head: Normocephalic and atraumatic.  Nose: Nose normal.  Mouth/Throat: Oropharynx is clear and moist. No oropharyngeal exudate.  Eyes: Conjunctivae and EOM are normal. Pupils are equal, round, and reactive to light. Right eye exhibits no discharge. Left eye exhibits no discharge. No scleral icterus.  Neck: Normal range of motion. Neck supple. No tracheal deviation present. No  thyromegaly present.  Cardiovascular: Normal rate, regular rhythm and normal heart sounds.  Exam reveals no gallop and no friction rub.   No murmur heard. Pulmonary/Chest: Effort normal and breath sounds normal. She has no wheezes. She has no rales.  Breast reconstruction intact, well healed  Abdominal: Soft. Bowel sounds are normal. She exhibits no distension and no mass. There is no tenderness. There is no rebound and no guarding.  Abdominal surgical site is well healed  Musculoskeletal: Normal range of motion. She exhibits no edema.  Lymphadenopathy:    She has no cervical adenopathy.  Neurological: She is alert and oriented to person, place, and time. She has normal reflexes. No cranial nerve deficit. Gait normal. Coordination normal.  Skin: Skin is warm and dry. No rash noted.  Psychiatric: Mood, memory, affect and judgment normal.  Nursing note and vitals reviewed.   LABORATORY DATA:  CBC    Component Value Date/Time   WBC 26.6* 11/08/2014 0920   WBC 7.6 09/05/2014 0914   RBC 3.71* 11/08/2014 0920   RBC 4.17 09/05/2014 0914   HGB 10.5* 11/08/2014 0920   HGB 11.7 09/05/2014 0914   HCT 31.5* 11/08/2014 0920  HCT 35.4 09/05/2014 0914   PLT 305 11/08/2014 0920   PLT 297 09/05/2014 0914   MCV 84.9 11/08/2014 0920   MCV 84.9 09/05/2014 0914   MCH 28.3 11/08/2014 0920   MCH 28.1 09/05/2014 0914   MCHC 33.3 11/08/2014 0920   MCHC 33.1 09/05/2014 0914   RDW 19.9* 11/08/2014 0920   RDW 14.9* 09/05/2014 0914   LYMPHSABS 0.5* 11/08/2014 0920   LYMPHSABS 1.6 09/05/2014 0914   MONOABS 0.9 11/08/2014 0920   MONOABS 0.5 09/05/2014 0914   EOSABS 0.0 11/08/2014 0920   EOSABS 0.2 09/05/2014 0914   BASOSABS 0.0 11/08/2014 0920   BASOSABS 0.0 09/05/2014 0914   CMP     Component Value Date/Time   NA 141 11/08/2014 0920   NA 139 09/05/2014 0914   K 3.6 11/08/2014 0920   K 3.8 09/05/2014 0914   CL 106 11/08/2014 0920   CO2 26 11/08/2014 0920   CO2 29 09/05/2014 0914   GLUCOSE  142* 11/08/2014 0920   GLUCOSE 88 09/05/2014 0914   BUN 14 11/08/2014 0920   BUN 6.0* 09/05/2014 0914   CREATININE 0.79 11/08/2014 0920   CREATININE 0.8 09/05/2014 0914   CALCIUM 9.0 11/08/2014 0920   CALCIUM 9.3 09/05/2014 0914   PROT 6.1 11/08/2014 0920   PROT 6.6 09/05/2014 0914   ALBUMIN 3.3* 11/08/2014 0920   ALBUMIN 3.4* 09/05/2014 0914   AST 20 11/08/2014 0920   AST 16 09/05/2014 0914   ALT 14 11/08/2014 0920   ALT 9 09/05/2014 0914   ALKPHOS 74 11/08/2014 0920   ALKPHOS 83 09/05/2014 0914   BILITOT 0.3 11/08/2014 0920   BILITOT 0.62 09/05/2014 0914   GFRNONAA >90 11/08/2014 0920   GFRAA >90 11/08/2014 0920    RADIOGRAPHIC STUDIES:  07/06/2014 EXAM: CT CHEST, ABDOMEN, AND PELVIS WITH CONTRAST  IMPRESSION: CT CHEST IMPRESSION  No acute process or evidence of metastatic disease in the chest.  CT ABDOMEN AND PELVIS IMPRESSION  1. Isolated left upper quadrant "Mass" which is indeterminate. Given its density similarity to the spleen, splenule is suspected. Atypical isolated appearance of omental/peritoneal metastasis felt less likely. Consider confirmation of splenule with nuclear medicine damaged right blood cell or liver/ spleen MAA scan. 2. Otherwise, no evidence of metastatic disease within the abdomen or pelvis. 3. Small volume cul-de-sac fluid which could be physiologic. Given slight increased density within, question recent cyst or follicle rupture.   Electronically Signed  By: Abigail Miyamoto M.D.  On: 07/06/2014 13:29    ASSESSMENT and THERAPY PLAN:    Breast cancer of lower-inner quadrant of right female breast 44 year old female with invasive carcinoma the right breast ER positive, HER-2 negative. She had associated intermediate grade DCIS. She is currently receiving treatment with Taxotere and Cytoxan. She is doing remarkably well. We are going to start her back on Zoladex today. The plan moving forward will be to initiate AI therapy. We  will plan on seeing her back again in 3 weeks for her final cycle of chemotherapy and sooner if needed.   All questions were answered. The patient knows to call the clinic with any problems, questions or concerns. We can certainly see the patient much sooner if necessary. This note was signed electronically Molli Hazard 11/08/2014

## 2014-11-08 NOTE — Progress Notes (Signed)
Lydia Stevens Tolerated chemotherapy well today.  Discharged ambulatory

## 2014-11-08 NOTE — Patient Instructions (Signed)
[facility] Discharge Instructions for Patients Receiving Chemotherapy  Today you received the following chemotherapy agents Taxotere and Cytoxan. To help prevent nausea and vomiting after your treatment, we encourage you to take your nausea medication as instructed. If you develop nausea and vomiting that is not controlled by your nausea medication, call the clinic. If it is after clinic hours your family physician or the after hours number for the clinic or go to the Emergency Department. BELOW ARE SYMPTOMS THAT SHOULD BE REPORTED IMMEDIATELY:  *FEVER GREATER THAN 101.0 F  *CHILLS WITH OR WITHOUT FEVER  NAUSEA AND VOMITING THAT IS NOT CONTROLLED WITH YOUR NAUSEA MEDICATION  *UNUSUAL SHORTNESS OF BREATH  *UNUSUAL BRUISING OR BLEEDING  TENDERNESS IN MOUTH AND THROAT WITH OR WITHOUT PRESENCE OF ULCERS  *URINARY PROBLEMS  *BOWEL PROBLEMS  UNUSUAL RASH Items with * indicate a potential emergency and should be followed up as soon as possible.  Return for Neulasta injection tomorrow as scheduled.  I have been informed and understand all the instructions given to me. I know to contact the clinic, my physician, or go to the Emergency Department if any problems should occur. I do not have any questions at this time, but understand that I may call the clinic during office hours or the Patient Navigator at [phone] should I have any questions or need assistance in obtaining follow up care.    __________________________________________  _____________  __________ Signature of Patient or Authorized Representative            Date                   Time    __________________________________________ Nurse's Signature

## 2014-11-08 NOTE — Patient Instructions (Signed)
[place] at [facility] Discharge Instructions  RECOMMENDATIONS MADE BY THE CONSULTANT AND ANY TEST RESULTS WILL BE SENT TO YOUR REFERRING PHYSICIAN.  You have been prescribed Tamoxifen.  Start taking in 2-3 weeks, and please call the clinic with the start date so we can document it. Return in 6 weeks for lab work and office visit.  Thank you for choosing [place] at [facility] to provide your oncology and hematology care.  To afford each patient quality time with our provider, please arrive at least 15 minutes before your scheduled appointment time.    You need to re-schedule your appointment should you arrive 10 or more minutes late.  We strive to give you quality time with our providers, and arriving late affects you and other patients whose appointments are after yours.  Also, if you no show three or more times for appointments you may be dismissed from the clinic at the providers discretion.     Again, thank you for choosing [facility].  Our hope is that these requests will decrease the amount of time that you wait before being seen by our physicians.       _____________________________________________________________  Should you have questions after your visit to [facility], please contact our office at [phone] between the hours of 8:30 a.m. and 4:30 p.m.  Voicemails left after 4:30 p.m. will not be returned until the following business day.  For prescription refill requests, have your pharmacy contact our office.   Tamoxifen oral tablet What is this medicine? TAMOXIFEN (ta MOX i fen) blocks the effects of estrogen. It is commonly used to treat breast cancer. It is also used to decrease the chance of breast cancer coming back in women who have received treatment for the disease. It may also help prevent breast cancer in women who have a high risk of developing breast cancer. This medicine may be used for  other purposes; ask your health care provider or pharmacist if you have questions. COMMON BRAND NAME(S): Nolvadex What should I tell my health care provider before I take this medicine? They need to know if you have any of these conditions: -blood clots -blood disease -cataracts or impaired eyesight -endometriosis -high calcium levels -high cholesterol -irregular menstrual cycles -liver disease -stroke -uterine fibroids -an unusual or allergic reaction to tamoxifen, other medicines, foods, dyes, or preservatives -pregnant or trying to get pregnant -breast-feeding How should I use this medicine? Take this medicine by mouth with a glass of water. Follow the directions on the prescription label. You can take it with or without food. Take your medicine at regular intervals. Do not take your medicine more often than directed. Do not stop taking except on your doctor's advice. A special MedGuide will be given to you by the pharmacist with each prescription and refill. Be sure to read this information carefully each time. Talk to your pediatrician regarding the use of this medicine in children. While this drug may be prescribed for selected conditions, precautions do apply. Overdosage: If you think you have taken too much of this medicine contact a poison control center or emergency room at once. NOTE: This medicine is only for you. Do not share this medicine with others. What if I miss a dose? If you miss a dose, take it as soon as you can. If it is almost time for your next dose, take only that dose. Do not take double or extra doses.  What may interact with this medicine? -aminoglutethimide -bromocriptine -chemotherapy drugs -female hormones, like estrogens and birth control pills -letrozole -medroxyprogesterone -phenobarbital -rifampin -warfarin This list may not describe all possible interactions. Give your health care provider a list of all the medicines, herbs, non-prescription  drugs, or dietary supplements you use. Also tell them if you smoke, drink alcohol, or use illegal drugs. Some items may interact with your medicine. What should I watch for while using this medicine? Visit your doctor or health care professional for regular checks on your progress. You will need regular pelvic exams, breast exams, and mammograms. If you are taking this medicine to reduce your risk of getting breast cancer, you should know that this medicine does not prevent all types of breast cancer. If breast cancer or other problems occur, there is no guarantee that it will be found at an early stage. Do not become pregnant while taking this medicine or for 2 months after stopping this medicine. Stop taking this medicine if you get pregnant or think you are pregnant and contact your doctor. This medicine may harm your unborn baby. Women who can possibly become pregnant should use birth control methods that do not use hormones during tamoxifen treatment and for 2 months after therapy has stopped. Talk with your health care provider for birth control advice. Do not breast feed while taking this medicine. What side effects may I notice from receiving this medicine? Side effects that you should report to your doctor or health care professional as soon as possible: -changes in vision (blurred vision) -changes in your menstrual cycle -difficulty breathing or shortness of breath -difficulty walking or talking -new breast lumps -numbness -pelvic pain or pressure -redness, blistering, peeling or loosening of the skin, including inside the mouth -skin rash or itching (hives) -sudden chest pain -swelling of lips, face, or tongue -swelling, pain or tenderness in your calf or leg -unusual bruising or bleeding -vaginal discharge that is bloody, brown, or rust -weakness -yellowing of the whites of the eyes or skin Side effects that usually do not require medical attention (report to your doctor or health  care professional if they continue or are bothersome): -fatigue -hair loss, although uncommon and is usually mild -headache -hot flashes -impotence (in men) -nausea, vomiting (mild) -vaginal discharge (white or clear) This list may not describe all possible side effects. Call your doctor for medical advice about side effects. You may report side effects to FDA at 1-800-FDA-1088. Where should I keep my medicine? Keep out of the reach of children. Store at room temperature between 20 and 25 degrees C (68 and 77 degrees F). Protect from light. Keep container tightly closed. Throw away any unused medicine after the expiration date. NOTE: This sheet is a summary. It may not cover all possible information. If you have questions about this medicine, talk to your doctor, pharmacist, or health care provider.  2015, Elsevier/Gold Standard. (2008-04-28 12:01:56)

## 2014-11-08 NOTE — Assessment & Plan Note (Addendum)
Stage IA (T1C,N0,M0) right breast invasive ductal carcinoma (with associated DCIS) status post mastectomy 07/27/2014 followed by reconstruction with TRAM flap.  Tumor was 2 cm in size with intermediate grade DCIS, lymphovascular invasion noted, 2 SLN negative, grade 3.  ER+ (100%), PR+ (71%), Her-2 negative.Core needle biopsy of L breast lesion c/w fibroadenoma. Oncotype DX score 24, 16% risk of recurrence. Starting cycle #4 of TC today and began Zoladex for Ovarian Suppression on 10/18/2014 with a possible reaction consisting of severe hives.  We will not re-challenge her with Zoladex.  Pre-chemo labs today.  An Rx for Tamoxifen is provided today and she is to start 2-3 weeks from now.  Return in 6 weeks for labs: CBC diff, CMET and to evaluate tolerability of Tamoxifen.

## 2014-11-10 ENCOUNTER — Ambulatory Visit (HOSPITAL_COMMUNITY): Payer: 59

## 2014-11-10 ENCOUNTER — Encounter (HOSPITAL_BASED_OUTPATIENT_CLINIC_OR_DEPARTMENT_OTHER): Payer: 59

## 2014-11-10 ENCOUNTER — Encounter (HOSPITAL_COMMUNITY): Payer: Self-pay

## 2014-11-10 DIAGNOSIS — C50311 Malignant neoplasm of lower-inner quadrant of right female breast: Secondary | ICD-10-CM

## 2014-11-10 DIAGNOSIS — Z5189 Encounter for other specified aftercare: Secondary | ICD-10-CM

## 2014-11-10 MED ORDER — PEGFILGRASTIM INJECTION 6 MG/0.6ML ~~LOC~~
PREFILLED_SYRINGE | SUBCUTANEOUS | Status: AC
  Filled 2014-11-10: qty 0.6

## 2014-11-10 MED ORDER — PEGFILGRASTIM INJECTION 6 MG/0.6ML ~~LOC~~
6.0000 mg | PREFILLED_SYRINGE | Freq: Once | SUBCUTANEOUS | Status: AC
  Administered 2014-11-10: 6 mg via SUBCUTANEOUS

## 2014-11-10 NOTE — Patient Instructions (Signed)
[place] at [facility] Discharge Instructions  RECOMMENDATIONS MADE BY THE CONSULTANT AND ANY TEST RESULTS WILL BE SENT TO YOUR REFERRING PHYSICIAN.  You received your neulasta injection today.  Call for any questions or concerns.  We will see you at your next appointment with Dr.[place]land in April.  Thank you for choosing [place] at [facility] to provide your oncology and hematology care.  To afford each patient quality time with our provider, please arrive at least 15 minutes before your scheduled appointment time.    You need to re-schedule your appointment should you arrive 10 or more minutes late.  We strive to give you quality time with our providers, and arriving late affects you and other patients whose appointments are after yours.  Also, if you no show three or more times for appointments you may be dismissed from the clinic at the providers discretion.     Again, thank you for choosing [facility].  Our hope is that these requests will decrease the amount of time that you wait before being seen by our physicians.       _____________________________________________________________  Should you have questions after your visit to [facility], please contact our office at [phone] between the hours of 8:30 a.m. and 4:30 p.m.  Voicemails left after 4:30 p.m. will not be returned until the following business day.  For prescription refill requests, have your pharmacy contact our office.

## 2014-11-10 NOTE — Progress Notes (Signed)
Lydia Stevens presents today for injection per MD orders. Neulasta 6mg  administered SQ in right Abdomen. Administration without incident. Patient tolerated well.

## 2014-11-15 ENCOUNTER — Ambulatory Visit (HOSPITAL_COMMUNITY): Payer: 59

## 2014-11-16 ENCOUNTER — Encounter: Payer: Self-pay | Admitting: Hematology and Oncology

## 2014-11-16 NOTE — Progress Notes (Signed)
I faxed infusion dates to [place]. Dr on fax is not [facility] dr and I advised on fax on last labs and notes for [facility]. No future dates scheduled as of today.

## 2014-11-29 ENCOUNTER — Encounter (HOSPITAL_COMMUNITY): Payer: Self-pay

## 2014-11-29 ENCOUNTER — Encounter (HOSPITAL_BASED_OUTPATIENT_CLINIC_OR_DEPARTMENT_OTHER): Payer: Self-pay | Admitting: *Deleted

## 2014-11-29 NOTE — Progress Notes (Signed)
Labs done 11/08/14-will bring overnight bag and meds

## 2014-12-02 NOTE — H&P (Signed)
  Subjective:    Patient ID: Lydia Stevens is a 44 y.o. female.  Follow-up  4 months post op mastectomy/TRAM reconstruction. Completed chemotherapy earlier this month.  Final path with right breast IDC 2 cm tumor with intermediate grade DCIS, lymphovascular invasion noted, 2 SLN negative, grade 3. She presented following screening MMG that revealed a 9 mm abnormality in the right breast. Bx demonstrated invasive ductal carcinoma with DCIS. The left breast just 7 mm fibroadenoma. Patient that there was a left upper quadrant abdominal diaphragmatic mass measuring 3 x 1.1 cm in size, unclear significance. Mass 9 mm by ultrasound. ER/PR + HER-2 -. Her genetics testing is negative.   NM scan revealed uptake in mandible. 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 2. 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 Follow-up MRI of the brain with and without Contrast recommended for further assessment." Prior DD bra, Desires smaller size.   Review of Systems  All other systems reviewed and are negative.     Objective:    Physical Exam  CV: normal heart sounds PULM clear to auscultation Abdomen soft, all areas healed  Flap soft, nice ptosis, minor upper pole depression L breast without masses, grade 1 ptosis SN to nipple L 28 cm BW 18 cm Nipple to IMF L 11 cm     Assessment:      Right breast cancer lower inner quadrant cancer s/p TRAM reconstruction    Plan:      Plan left breast reduction for symmetry, removal port, local flap and full thickness skin graft reconstruction nipple areola. Happy with right breast volume so will proceed with NAC reconstruction. Discussed fat grafting but patient does not seem to be bothered my minor upper pole depression.   Discussed OP surgery ,possible drain on left,bolster  dressing on right and right thigh scar. Anticipate 2 weeks for recovery. New pictures inc abdomen today  States her work sent my paperwork for her leave, was supposed to be filled out by end of month. Counseled I have not received any paperwork and she will follow up on this. Would prefer to remain out of work until recovered from next surgery.  Irene Limbo, MD [facility] [phone]

## 2014-12-06 ENCOUNTER — Ambulatory Visit (HOSPITAL_BASED_OUTPATIENT_CLINIC_OR_DEPARTMENT_OTHER)
Admission: RE | Admit: 2014-12-06 | Discharge: 2014-12-07 | Disposition: A | Discharge status: Home / Self Care | Payer: 59 | Source: Ambulatory Visit | Attending: Plastic Surgery | Admitting: Plastic Surgery

## 2014-12-06 ENCOUNTER — Encounter (HOSPITAL_BASED_OUTPATIENT_CLINIC_OR_DEPARTMENT_OTHER): Payer: Self-pay | Admitting: *Deleted

## 2014-12-06 ENCOUNTER — Ambulatory Visit (HOSPITAL_BASED_OUTPATIENT_CLINIC_OR_DEPARTMENT_OTHER): Payer: 59 | Admitting: Anesthesiology

## 2014-12-06 ENCOUNTER — Encounter (HOSPITAL_BASED_OUTPATIENT_CLINIC_OR_DEPARTMENT_OTHER)
Admission: RE | Disposition: A | Discharge status: Home / Self Care | Payer: Self-pay | Source: Ambulatory Visit | Attending: Plastic Surgery

## 2014-12-06 DIAGNOSIS — Z853 Personal history of malignant neoplasm of breast: Secondary | ICD-10-CM | POA: Insufficient documentation

## 2014-12-06 DIAGNOSIS — K219 Gastro-esophageal reflux disease without esophagitis: Secondary | ICD-10-CM | POA: Insufficient documentation

## 2014-12-06 DIAGNOSIS — Z421 Encounter for breast reconstruction following mastectomy: Secondary | ICD-10-CM | POA: Diagnosis not present

## 2014-12-06 DIAGNOSIS — N62 Hypertrophy of breast: Secondary | ICD-10-CM | POA: Insufficient documentation

## 2014-12-06 DIAGNOSIS — Z9010 Acquired absence of unspecified breast and nipple: Secondary | ICD-10-CM

## 2014-12-06 DIAGNOSIS — Z9011 Acquired absence of right breast and nipple: Secondary | ICD-10-CM | POA: Insufficient documentation

## 2014-12-06 HISTORY — PX: BREAST RECONSTRUCTION: SHX9

## 2014-12-06 HISTORY — PX: BREAST REDUCTION SURGERY: SHX8

## 2014-12-06 HISTORY — PX: PORT-A-CATH REMOVAL: SHX5289

## 2014-12-06 HISTORY — DX: Gastro-esophageal reflux disease without esophagitis: K21.9

## 2014-12-06 LAB — POCT HEMOGLOBIN-HEMACUE: Hemoglobin: 11.2 g/dL — ABNORMAL LOW (ref 12.0–15.0)

## 2014-12-06 SURGERY — MAMMOPLASTY, REDUCTION
Anesthesia: General | Site: Chest | Laterality: Right

## 2014-12-06 MED ORDER — CEFAZOLIN SODIUM 1-5 GM-% IV SOLN
INTRAVENOUS | Status: AC
  Filled 2014-12-06: qty 50

## 2014-12-06 MED ORDER — ONDANSETRON HCL 4 MG/2ML IJ SOLN: 4.0000 mg | Freq: Once | INTRAMUSCULAR | Status: DC | PRN

## 2014-12-06 MED ORDER — ZOLPIDEM TARTRATE 10 MG PO TABS
10.0000 mg | ORAL_TABLET | Freq: Every evening | ORAL | Status: DC | PRN
Start: 2014-12-06 — End: 2014-12-07

## 2014-12-06 MED ORDER — DEXAMETHASONE SODIUM PHOSPHATE 4 MG/ML IJ SOLN
INTRAMUSCULAR | Status: DC | PRN
  Administered 2014-12-06: 10 mg via INTRAVENOUS

## 2014-12-06 MED ORDER — CEFAZOLIN SODIUM 1-5 GM-% IV SOLN
1.0000 g | Freq: Three times a day (TID) | INTRAVENOUS | Status: AC
  Administered 2014-12-06 – 2014-12-07 (×3): 1 g via INTRAVENOUS

## 2014-12-06 MED ORDER — EPHEDRINE SULFATE 50 MG/ML IJ SOLN
INTRAMUSCULAR | Status: DC | PRN
  Administered 2014-12-06: 10 mg via INTRAVENOUS

## 2014-12-06 MED ORDER — FENTANYL CITRATE 0.05 MG/ML IJ SOLN: 50.0000 ug | INTRAMUSCULAR | Status: DC | PRN

## 2014-12-06 MED ORDER — OXYCODONE HCL 5 MG/5ML PO SOLN: 5.0000 mg | Freq: Once | ORAL | Status: DC | PRN

## 2014-12-06 MED ORDER — MIDAZOLAM HCL 2 MG/2ML IJ SOLN
INTRAMUSCULAR | Status: AC
  Filled 2014-12-06: qty 2

## 2014-12-06 MED ORDER — CEFAZOLIN SODIUM-DEXTROSE 2-3 GM-% IV SOLR
INTRAVENOUS | Status: AC
  Filled 2014-12-06: qty 50

## 2014-12-06 MED ORDER — BUPIVACAINE-EPINEPHRINE (PF) 0.25% -1:200000 IJ SOLN
INTRAMUSCULAR | Status: AC
  Filled 2014-12-06: qty 60

## 2014-12-06 MED ORDER — FENTANYL CITRATE 0.05 MG/ML IJ SOLN
INTRAMUSCULAR | Status: AC
  Filled 2014-12-06: qty 10

## 2014-12-06 MED ORDER — KETOROLAC TROMETHAMINE 15 MG/ML IJ SOLN
15.0000 mg | Freq: Three times a day (TID) | INTRAMUSCULAR | Status: DC
  Administered 2014-12-06 – 2014-12-07 (×3): 15 mg via INTRAVENOUS
  Filled 2014-12-06 (×3): qty 1

## 2014-12-06 MED ORDER — MIDAZOLAM HCL 2 MG/2ML IJ SOLN: 1.0000 mg | INTRAMUSCULAR | Status: DC | PRN

## 2014-12-06 MED ORDER — PROPOFOL 10 MG/ML IV EMUL
INTRAVENOUS | Status: AC
  Filled 2014-12-06: qty 50

## 2014-12-06 MED ORDER — HYDROMORPHONE HCL 1 MG/ML IJ SOLN
0.2500 mg | INTRAMUSCULAR | Status: DC | PRN
  Administered 2014-12-06 (×3): 0.5 mg via INTRAVENOUS

## 2014-12-06 MED ORDER — TRAMADOL HCL 50 MG PO TABS: 50.0000 mg | ORAL_TABLET | Freq: Four times a day (QID) | ORAL | Status: DC | PRN

## 2014-12-06 MED ORDER — HYDROMORPHONE HCL 1 MG/ML IJ SOLN
INTRAMUSCULAR | Status: AC
  Filled 2014-12-06: qty 1

## 2014-12-06 MED ORDER — OXYCODONE HCL 5 MG PO TABS: 5.0000 mg | ORAL_TABLET | Freq: Once | ORAL | Status: DC | PRN

## 2014-12-06 MED ORDER — SCOPOLAMINE 1 MG/3DAYS TD PT72
MEDICATED_PATCH | TRANSDERMAL | Status: AC
  Filled 2014-12-06: qty 1

## 2014-12-06 MED ORDER — TRAMADOL HCL 50 MG PO TABS
50.0000 mg | ORAL_TABLET | Freq: Four times a day (QID) | ORAL | Status: DC | PRN
  Administered 2014-12-07: 50 mg via ORAL
  Filled 2014-12-06: qty 1

## 2014-12-06 MED ORDER — LIDOCAINE HCL (CARDIAC) 20 MG/ML IV SOLN
INTRAVENOUS | Status: DC | PRN
  Administered 2014-12-06: 60 mg via INTRAVENOUS

## 2014-12-06 MED ORDER — KCL IN DEXTROSE-NACL 20-5-0.45 MEQ/L-%-% IV SOLN
INTRAVENOUS | Status: DC
  Administered 2014-12-06: 14:00:00 via INTRAVENOUS
  Filled 2014-12-06: qty 1000

## 2014-12-06 MED ORDER — SCOPOLAMINE 1 MG/3DAYS TD PT72
1.0000 | MEDICATED_PATCH | TRANSDERMAL | Status: DC
  Administered 2014-12-06: 1.5 mg via TRANSDERMAL

## 2014-12-06 MED ORDER — HYDROMORPHONE HCL 1 MG/ML IJ SOLN
0.5000 mg | INTRAMUSCULAR | Status: DC | PRN
  Administered 2014-12-06: 0.5 mg via INTRAVENOUS
  Filled 2014-12-06: qty 1

## 2014-12-06 MED ORDER — CEFAZOLIN SODIUM-DEXTROSE 2-3 GM-% IV SOLR
2.0000 g | INTRAVENOUS | Status: AC
  Administered 2014-12-06: 2 g via INTRAVENOUS

## 2014-12-06 MED ORDER — ONDANSETRON HCL 4 MG/2ML IJ SOLN
4.0000 mg | Freq: Four times a day (QID) | INTRAMUSCULAR | Status: DC | PRN
  Administered 2014-12-06: 4 mg via INTRAVENOUS
  Filled 2014-12-06: qty 2

## 2014-12-06 MED ORDER — FENTANYL CITRATE 0.05 MG/ML IJ SOLN
INTRAMUSCULAR | Status: DC | PRN
  Administered 2014-12-06 (×3): 100 ug via INTRAVENOUS

## 2014-12-06 MED ORDER — LACTATED RINGERS IV SOLN
INTRAVENOUS | Status: DC
  Administered 2014-12-06 (×3): via INTRAVENOUS

## 2014-12-06 MED ORDER — PROMETHAZINE HCL 25 MG/ML IJ SOLN
12.5000 mg | INTRAMUSCULAR | Status: DC | PRN
  Administered 2014-12-06: 12.5 mg via INTRAVENOUS
  Filled 2014-12-06: qty 1

## 2014-12-06 MED ORDER — TAMOXIFEN CITRATE 20 MG PO TABS: 20.0000 mg | ORAL_TABLET | Freq: Every day | ORAL | Status: DC

## 2014-12-06 MED ORDER — ONDANSETRON HCL 4 MG PO TABS: 4.0000 mg | ORAL_TABLET | Freq: Four times a day (QID) | ORAL | Status: DC | PRN

## 2014-12-06 MED ORDER — CHLORHEXIDINE GLUCONATE 4 % EX LIQD: 1.0000 "application " | Freq: Once | CUTANEOUS | Status: DC

## 2014-12-06 MED ORDER — SUCCINYLCHOLINE CHLORIDE 20 MG/ML IJ SOLN
INTRAMUSCULAR | Status: DC | PRN
  Administered 2014-12-06: 100 mg via INTRAVENOUS

## 2014-12-06 MED ORDER — LIDOCAINE HCL 4 % MT SOLN
OROMUCOSAL | Status: DC | PRN
  Administered 2014-12-06: 3 mL via TOPICAL

## 2014-12-06 MED ORDER — ONDANSETRON HCL 4 MG/2ML IJ SOLN
INTRAMUSCULAR | Status: DC | PRN
  Administered 2014-12-06: 4 mg via INTRAVENOUS

## 2014-12-06 MED ORDER — BACITRACIN-NEOMYCIN-POLYMYXIN 400-5-5000 EX OINT
TOPICAL_OINTMENT | CUTANEOUS | Status: AC
  Filled 2014-12-06: qty 1

## 2014-12-06 MED ORDER — PROPOFOL 10 MG/ML IV BOLUS
INTRAVENOUS | Status: DC | PRN
  Administered 2014-12-06: 200 mg via INTRAVENOUS

## 2014-12-06 MED ORDER — LIDOCAINE-EPINEPHRINE 1 %-1:100000 IJ SOLN
INTRAMUSCULAR | Status: AC
  Filled 2014-12-06: qty 2

## 2014-12-06 MED ORDER — MIDAZOLAM HCL 5 MG/5ML IJ SOLN
INTRAMUSCULAR | Status: DC | PRN
  Administered 2014-12-06: 2 mg via INTRAVENOUS

## 2014-12-06 SURGICAL SUPPLY — 89 items
APL SKNCLS STERI-STRIP NONHPOA (GAUZE/BANDAGES/DRESSINGS)
BALL CTTN LRG ABS STRL LF (GAUZE/BANDAGES/DRESSINGS)
BENZOIN TINCTURE PRP APPL 2/3 (GAUZE/BANDAGES/DRESSINGS) IMPLANT
BINDER BREAST LRG (GAUZE/BANDAGES/DRESSINGS) IMPLANT
BINDER BREAST MEDIUM (GAUZE/BANDAGES/DRESSINGS) IMPLANT
BINDER BREAST XLRG (GAUZE/BANDAGES/DRESSINGS) IMPLANT
BINDER BREAST XXLRG (GAUZE/BANDAGES/DRESSINGS) IMPLANT
BLADE CLIPPER SURG (BLADE) IMPLANT
BLADE SURG 10 STRL SS (BLADE) ×10 IMPLANT
BLADE SURG 15 STRL LF DISP TIS (BLADE) ×3 IMPLANT
BLADE SURG 15 STRL SS (BLADE) ×5
BNDG GAUZE ELAST 4 BULKY (GAUZE/BANDAGES/DRESSINGS) ×4 IMPLANT
BRUSH SCRUB EZ PLAIN DRY (MISCELLANEOUS) ×3 IMPLANT
CANISTER SUCT 1200ML W/VALVE (MISCELLANEOUS) ×5 IMPLANT
CHLORAPREP W/TINT 26ML (MISCELLANEOUS) ×8 IMPLANT
CLOSURE WOUND 1/2 X4 (GAUZE/BANDAGES/DRESSINGS)
CLOSURE WOUND 1/4X4 (GAUZE/BANDAGES/DRESSINGS)
COTTONBALL LRG STERILE PKG (GAUZE/BANDAGES/DRESSINGS) IMPLANT
COVER BACK TABLE 60X90IN (DRAPES) ×5 IMPLANT
COVER MAYO STAND STRL (DRAPES) ×5 IMPLANT
COVER SURGICAL LIGHT HANDLE (MISCELLANEOUS) ×2 IMPLANT
DECANTER SPIKE VIAL GLASS SM (MISCELLANEOUS) IMPLANT
DRAIN CHANNEL 15F RND FF W/TCR (WOUND CARE) ×6 IMPLANT
DRAPE LAPAROSCOPIC ABDOMINAL (DRAPES) ×5 IMPLANT
DRAPE LAPAROTOMY 100X72 PEDS (DRAPES) ×3 IMPLANT
DRSG EMULSION OIL 3X3 NADH (GAUZE/BANDAGES/DRESSINGS) ×3 IMPLANT
DRSG PAD ABDOMINAL 8X10 ST (GAUZE/BANDAGES/DRESSINGS) ×10 IMPLANT
ELECT COATED BLADE 2.86 ST (ELECTRODE) ×5 IMPLANT
ELECT NDL BLADE 2-5/6 (NEEDLE) ×3 IMPLANT
ELECT NEEDLE BLADE 2-5/6 (NEEDLE) IMPLANT
ELECT REM PT RETURN 9FT ADLT (ELECTROSURGICAL) ×5
ELECTRODE REM PT RTRN 9FT ADLT (ELECTROSURGICAL) ×3 IMPLANT
EVACUATOR SILICONE 100CC (DRAIN) IMPLANT
GAUZE XEROFORM 1X8 LF (GAUZE/BANDAGES/DRESSINGS) IMPLANT
GAUZE XEROFORM 5X9 LF (GAUZE/BANDAGES/DRESSINGS) IMPLANT
GLOVE BIO SURGEON STRL SZ 6 (GLOVE) ×8 IMPLANT
GLOVE BIO SURGEON STRL SZ 6.5 (GLOVE) IMPLANT
GLOVE BIO SURGEONS STRL SZ 6.5 (GLOVE)
GLOVE BIOGEL PI IND STRL 7.0 (GLOVE) IMPLANT
GLOVE BIOGEL PI IND STRL 8 (GLOVE) ×1 IMPLANT
GLOVE BIOGEL PI INDICATOR 7.0 (GLOVE) ×4
GLOVE BIOGEL PI INDICATOR 8 (GLOVE) ×2
GLOVE ECLIPSE 6.5 STRL STRAW (GLOVE) ×4 IMPLANT
GLOVE EXAM NITRILE EXT CUFF MD (GLOVE) ×2 IMPLANT
GLOVE SURG SS PI 8.0 STRL IVOR (GLOVE) ×3 IMPLANT
GOWN STRL REUS W/ TWL LRG LVL3 (GOWN DISPOSABLE) ×6 IMPLANT
GOWN STRL REUS W/ TWL XL LVL3 (GOWN DISPOSABLE) ×1 IMPLANT
GOWN STRL REUS W/TWL LRG LVL3 (GOWN DISPOSABLE) ×15
GOWN STRL REUS W/TWL XL LVL3 (GOWN DISPOSABLE) ×5
LIQUID BAND (GAUZE/BANDAGES/DRESSINGS) ×7 IMPLANT
NDL HYPO 30GX1 BEV (NEEDLE) IMPLANT
NDL PRECISIONGLIDE 27X1.5 (NEEDLE) ×2 IMPLANT
NEEDLE HYPO 30GX1 BEV (NEEDLE) IMPLANT
NEEDLE PRECISIONGLIDE 27X1.5 (NEEDLE) IMPLANT
NS IRRIG 1000ML POUR BTL (IV SOLUTION) ×5 IMPLANT
PACK BASIN DAY SURGERY FS (CUSTOM PROCEDURE TRAY) ×5 IMPLANT
PENCIL BUTTON HOLSTER BLD 10FT (ELECTRODE) ×5 IMPLANT
PIN SAFETY STERILE (MISCELLANEOUS) IMPLANT
SHEET MEDIUM DRAPE 40X70 STRL (DRAPES) ×4 IMPLANT
SLEEVE SCD COMPRESS KNEE MED (MISCELLANEOUS) ×5 IMPLANT
SLEEVE SURGEON STRL (DRAPES) ×3 IMPLANT
SPONGE GAUZE 2X2 8PLY STER LF (GAUZE/BANDAGES/DRESSINGS)
SPONGE GAUZE 2X2 8PLY STRL LF (GAUZE/BANDAGES/DRESSINGS) IMPLANT
SPONGE GAUZE 4X4 12PLY STER LF (GAUZE/BANDAGES/DRESSINGS) IMPLANT
SPONGE LAP 18X18 X RAY DECT (DISPOSABLE) ×10 IMPLANT
STAPLER VISISTAT 35W (STAPLE) ×8 IMPLANT
STRIP CLOSURE SKIN 1/2X4 (GAUZE/BANDAGES/DRESSINGS) IMPLANT
STRIP CLOSURE SKIN 1/4X4 (GAUZE/BANDAGES/DRESSINGS) IMPLANT
SUT ETHILON 2 0 FS 18 (SUTURE) ×6 IMPLANT
SUT MNCRL AB 4-0 PS2 18 (SUTURE) ×20 IMPLANT
SUT PLAIN 5 0 P 3 18 (SUTURE) ×4 IMPLANT
SUT PROLENE 5 0 P 3 (SUTURE) IMPLANT
SUT PROLENE 5 0 PS 2 (SUTURE) IMPLANT
SUT PROLENE 6 0 P 1 18 (SUTURE) IMPLANT
SUT SILK 4 0 PS 2 (SUTURE) ×4 IMPLANT
SUT VIC AB 3-0 PS1 18 (SUTURE) ×15
SUT VIC AB 3-0 PS1 18XBRD (SUTURE) ×7 IMPLANT
SUT VIC AB 5-0 P-3 18X BRD (SUTURE) IMPLANT
SUT VIC AB 5-0 P3 18 (SUTURE) ×5
SUT VICRYL 4-0 PS2 18IN ABS (SUTURE) ×17 IMPLANT
SYR BULB 3OZ (MISCELLANEOUS) IMPLANT
SYR BULB IRRIGATION 50ML (SYRINGE) ×5 IMPLANT
SYR CONTROL 10ML LL (SYRINGE) ×2 IMPLANT
TOWEL OR 17X24 6PK STRL BLUE (TOWEL DISPOSABLE) ×10 IMPLANT
TRAY DSU PREP LF (CUSTOM PROCEDURE TRAY) ×2 IMPLANT
TUBE CONNECTING 20'X1/4 (TUBING) ×1
TUBE CONNECTING 20X1/4 (TUBING) ×4 IMPLANT
UNDERPAD 30X30 INCONTINENT (UNDERPADS AND DIAPERS) ×10 IMPLANT
YANKAUER SUCT BULB TIP NO VENT (SUCTIONS) ×5 IMPLANT

## 2014-12-06 NOTE — Anesthesia Postprocedure Evaluation (Signed)
  Anesthesia Post-op Note  Patient: Lydia Stevens  Procedure(s) Performed: Procedure(s): LEFT BREAST REDUCTION FOR ASYMMETRY (Left) RIGHT NIPPLE AREOLAR RECONSTRUCTION WITH FULL THICKNESS SKIN GRAFT FROM RIGHT THIGH (Right) REMOVAL PORT-A-CATH (Left)  Patient Location: PACU  Anesthesia Type: General   Level of Consciousness: awake, alert  and oriented  Airway and Oxygen Therapy: Patient Spontanous Breathing  Post-op Pain: mild  Post-op Assessment: Post-op Vital signs reviewed  Post-op Vital Signs: Reviewed  Last Vitals:  Filed Vitals:   12/06/14 1415  BP: 98/59  Pulse: 85  Temp: 36.4 C  Resp: 16    Complications: No apparent anesthesia complications

## 2014-12-06 NOTE — Transfer of Care (Signed)
Immediate Anesthesia Transfer of Care Note  Patient: Lydia Stevens  Procedure(s) Performed: Procedure(s): LEFT BREAST REDUCTION FOR ASYMMETRY (Left) RIGHT NIPPLE AREOLAR RECONSTRUCTION WITH FULL THICKNESS SKIN GRAFT FROM RIGHT THIGH (Right) REMOVAL PORT-A-CATH (Left)  Patient Location: PACU  Anesthesia Type:General  Level of Consciousness: sedated  Airway & Oxygen Therapy: Patient Spontanous Breathing and Patient connected to face mask oxygen  Post-op Assessment: Report given to RN and Post -op Vital signs reviewed and stable  Post vital signs: Reviewed and stable  Last Vitals:  Filed Vitals:   12/06/14 0610  BP: 116/66  Pulse: 85  Temp: 36.7 C  Resp: 16    Complications: No apparent anesthesia complications

## 2014-12-06 NOTE — Anesthesia Preprocedure Evaluation (Addendum)
Anesthesia Evaluation  Patient identified by MRN, date of birth, ID band Patient awake    Reviewed: Allergy & Precautions, NPO status , Patient's Chart, lab work & pertinent test results  History of Anesthesia Complications Negative for: history of anesthetic complications  Airway Mallampati: I  TM Distance: >3 FB Neck ROM: Full    Dental  (+) Teeth Intact, Dental Advisory Given   Pulmonary neg pulmonary ROS,  breath sounds clear to auscultation        Cardiovascular negative cardio ROS  Rhythm:Regular Rate:Normal     Neuro/Psych negative neurological ROS  negative psych ROS   GI/Hepatic GERD-  Medicated and Controlled,  Endo/Other    Renal/GU      Musculoskeletal   Abdominal   Peds  Hematology   Anesthesia Other Findings   Reproductive/Obstetrics                            Anesthesia Physical Anesthesia Plan  ASA: II  Anesthesia Plan: General   Post-op Pain Management:    Induction: Intravenous  Airway Management Planned: Oral ETT  Additional Equipment:   Intra-op Plan:   Post-operative Plan: Extubation in OR  Informed Consent: I have reviewed the patients History and Physical, chart, labs and discussed the procedure including the risks, benefits and alternatives for the proposed anesthesia with the patient or authorized representative who has indicated his/her understanding and acceptance.   Dental advisory given  Plan Discussed with: CRNA, Anesthesiologist and Surgeon  Anesthesia Plan Comments:         Anesthesia Quick Evaluation

## 2014-12-06 NOTE — Op Note (Signed)
Operative Note   DATE OF OPERATION: 4.12.2016  LOCATION: [facility]-observation  SURGICAL DIVISION: Plastic Surgery  PREOPERATIVE DIAGNOSES:  1. History right breast cancer 2. Acquired absence breast 3. Asymmetry native and reconstructed breast  POSTOPERATIVE DIAGNOSES:  same  PROCEDURE:  1. Removal left chest port 2. Left breast reduction 3. Right nipple areolar reconstruction with adjacent tissue transfer and full thickness skin graft from groin  SURGEON: Irene Limbo MD MBA  ASSISTANT: none  ANESTHESIA:  General.   EBL: 482 ml  COMPLICATIONS: None immediate.   INDICATIONS FOR PROCEDURE:  The patient, Lydia Stevens, is a 44 y.o. female born on [birth year], is here for second stage breast reconstruction following immediate TRAM reconstruction on right.   FINDINGS: Left breast reduction approximately 230 g.  DESCRIPTION OF PROCEDURE:  The patient was marked with the patient in the preoperative area in standing position to mark chest midline, sternal notch, breast meridians, inframammary folds. The location for nipple areolar complex marked on anterior surface left breast by palpation of inframamary fold. This was adjusted slightly lower to match with most projecting point of right TRAM flap. With aid of Wise pattern marker, location of new nipple areolar complex and vertical limbs (7 cm) were marked. The patient was taken to the operating room. SCDs were placed and IV antibiotics were given. The patient's operative site was prepped and draped in a sterile fashion. A time out was performed and all information was confirmed to be correct.   Incision made over left chest port and cautery used to incise capsule. Sutures removed from port and device removed entirely without difficulty. Closure completed with 4-0 vicryl in dermis and 4-0 monocryl subcuticular. Tissue adhesive applied.  Over left breast, superomedial pedicle marked and nipple areolar complex marked with 50 mm  diameter marker. Pedicle deepithlialized and developed to 2-3 inches thickness and so that rotation superiorly was without tension. Tissue excised largely from central and inferior breast. Medial and lateral flaps developed and breast tailor tacked closed.Patient brought to upright sitting position and assessed for symmetry. Additional tissue rempved from left breast. Patent returned to supine position and cavity irrigated and hemostasis obtained. Closure completed with 3-0 vicryl approximate dermis along inframammary fold. NAC inset and vertical limb closed with 4-0 vicryl in dermis.Skin closure completed with 4-0 monocryl subcuticular.Tissue adhesive applied.  A modified star flap, based inferiorly, was designed over right TRAM flap skin paddle. 50 mm cookie cutter used to mark area for areola. The flaps were sharply incised and elevated in subcutaneous place. The lateral flaps were approximated in midline with 5-0 vicryl in dermis. The superior flap also inset and skin brought together with 5-0 plain gut. The remainder of marked area for areola was deepithelialized. Full thickness skin graft harvested from right groin and donor site closed with 3-0 vicryl in dermis and 4-0 monocryl subcuticular skin closure. Tissue adhesive applied. The skin was prepared to remove all subcutaneous fat and hair follicles. The graft was inset over right chest with 5-0 plain gut. Bolster dressing of adaptic and sterile sponge secured with 4-0 silk.   The patient was allowed to wake from anesthesia, extubated and taken to the recovery room in satisfactory condition.   SPECIMENS: left breast reduction  DRAINS: none  Irene Limbo, MD [facility] Plastic & Reconstructive Surgery [phone]

## 2014-12-06 NOTE — Anesthesia Procedure Notes (Signed)
Procedure Name: Intubation Date/Time: 12/06/2014 7:31 AM Performed by: Maryella Shivers Pre-anesthesia Checklist: Patient identified, Emergency Drugs available, Suction available and Patient being monitored Patient Re-evaluated:Patient Re-evaluated prior to inductionOxygen Delivery Method: Circle System Utilized Preoxygenation: Pre-oxygenation with 100% oxygen Intubation Type: IV induction Ventilation: Mask ventilation without difficulty Laryngoscope Size: Mac and 3 Grade View: Grade I Tube type: Oral Tube size: 7.0 mm Number of attempts: 1 Airway Equipment and Method: Stylet and Oral airway Placement Confirmation: ETT inserted through vocal cords under direct vision,  positive ETCO2 and breath sounds checked- equal and bilateral Secured at: 21 cm Tube secured with: Tape Dental Injury: Teeth and Oropharynx as per pre-operative assessment

## 2014-12-06 NOTE — Interval H&P Note (Signed)
History and Physical Interval Note:  12/06/2014 7:05 AM  Lydia Stevens  has presented today for surgery, with the diagnosis of ACQUIRED ABSENCE BREAST/HISTORY OF BREAST CANCER/ASYMMETRY NATIVE/RECONSTRUCTIVE BREAST  The various methods of treatment have been discussed with the patient and family. After consideration of risks, benefits and other options for treatment, the patient has consented to  Procedure(s): LEFT BREAST REDUCTION FOR ASYMMETRY (Left) RIGHT NIPPLE AEROLA  FOR RECONSTRUCTION WITH FULL THICKNESS SKIN GRAFT (Right) REMOVAL PORT-A-CATH (N/A) as a surgical intervention .  The patient's history has been reviewed, patient examined, no change in status, stable for surgery.  I have reviewed the patient's chart and labs.  Questions were answered to the patient's satisfaction.     Waddell Iten

## 2014-12-07 ENCOUNTER — Encounter (HOSPITAL_BASED_OUTPATIENT_CLINIC_OR_DEPARTMENT_OTHER): Payer: Self-pay | Admitting: Plastic Surgery

## 2014-12-07 DIAGNOSIS — Z421 Encounter for breast reconstruction following mastectomy: Secondary | ICD-10-CM | POA: Diagnosis not present

## 2014-12-07 MED ORDER — CEFAZOLIN SODIUM 1-5 GM-% IV SOLN
INTRAVENOUS | Status: AC
  Filled 2014-12-07: qty 50

## 2014-12-13 ENCOUNTER — Ambulatory Visit (HOSPITAL_COMMUNITY): Payer: 59

## 2014-12-20 ENCOUNTER — Encounter (HOSPITAL_COMMUNITY): Payer: 59 | Attending: Hematology & Oncology | Admitting: Hematology & Oncology

## 2014-12-20 ENCOUNTER — Encounter (HOSPITAL_COMMUNITY): Payer: 59

## 2014-12-20 ENCOUNTER — Encounter (HOSPITAL_BASED_OUTPATIENT_CLINIC_OR_DEPARTMENT_OTHER): Payer: 59

## 2014-12-20 ENCOUNTER — Encounter (HOSPITAL_COMMUNITY): Payer: Self-pay | Admitting: Hematology & Oncology

## 2014-12-20 VITALS — BP 111/65 | HR 79 | Temp 98.8°F | Resp 18 | Wt 186.9 lb

## 2014-12-20 DIAGNOSIS — R110 Nausea: Secondary | ICD-10-CM

## 2014-12-20 DIAGNOSIS — Z170 Estrogen receptor positive status [ER+]: Secondary | ICD-10-CM | POA: Insufficient documentation

## 2014-12-20 DIAGNOSIS — C50311 Malignant neoplasm of lower-inner quadrant of right female breast: Secondary | ICD-10-CM | POA: Diagnosis not present

## 2014-12-20 DIAGNOSIS — D0511 Intraductal carcinoma in situ of right breast: Secondary | ICD-10-CM | POA: Diagnosis not present

## 2014-12-20 DIAGNOSIS — B370 Candidal stomatitis: Secondary | ICD-10-CM | POA: Insufficient documentation

## 2014-12-20 DIAGNOSIS — C50911 Malignant neoplasm of unspecified site of right female breast: Secondary | ICD-10-CM

## 2014-12-20 LAB — CBC WITH DIFFERENTIAL/PLATELET
Basophils Absolute: 0 10*3/uL (ref 0.0–0.1)
Basophils Relative: 0 % (ref 0–1)
Eosinophils Absolute: 0.1 10*3/uL (ref 0.0–0.7)
Eosinophils Relative: 1 % (ref 0–5)
HEMATOCRIT: 32 % — AB (ref 36.0–46.0)
Hemoglobin: 10.4 g/dL — ABNORMAL LOW (ref 12.0–15.0)
LYMPHS ABS: 1.6 10*3/uL (ref 0.7–4.0)
Lymphocytes Relative: 19 % (ref 12–46)
MCH: 28.4 pg (ref 26.0–34.0)
MCHC: 32.5 g/dL (ref 30.0–36.0)
MCV: 87.4 fL (ref 78.0–100.0)
MONO ABS: 0.4 10*3/uL (ref 0.1–1.0)
MONOS PCT: 5 % (ref 3–12)
Neutro Abs: 6.4 10*3/uL (ref 1.7–7.7)
Neutrophils Relative %: 75 % (ref 43–77)
PLATELETS: 317 10*3/uL (ref 150–400)
RBC: 3.66 MIL/uL — AB (ref 3.87–5.11)
RDW: 17.5 % — AB (ref 11.5–15.5)
WBC: 8.6 10*3/uL (ref 4.0–10.5)

## 2014-12-20 LAB — COMPREHENSIVE METABOLIC PANEL
ALT: 23 U/L (ref 0–35)
AST: 22 U/L (ref 0–37)
Albumin: 3.7 g/dL (ref 3.5–5.2)
Alkaline Phosphatase: 45 U/L (ref 39–117)
Anion gap: 7 (ref 5–15)
BILIRUBIN TOTAL: 0.5 mg/dL (ref 0.3–1.2)
BUN: 12 mg/dL (ref 6–23)
CO2: 27 mmol/L (ref 19–32)
Calcium: 8.9 mg/dL (ref 8.4–10.5)
Chloride: 107 mmol/L (ref 96–112)
Creatinine, Ser: 0.72 mg/dL (ref 0.50–1.10)
GLUCOSE: 98 mg/dL (ref 70–99)
POTASSIUM: 3.7 mmol/L (ref 3.5–5.1)
Sodium: 141 mmol/L (ref 135–145)
TOTAL PROTEIN: 6.5 g/dL (ref 6.0–8.3)

## 2014-12-20 NOTE — Progress Notes (Signed)
LABS DRAWN

## 2014-12-20 NOTE — Patient Instructions (Signed)
..  [facility] Discharge Instructions  RECOMMENDATIONS MADE BY THE CONSULTANT AND ANY TEST RESULTS WILL BE SENT TO YOUR REFERRING PHYSICIAN.  Exam per Dr. Whitney Muse  Return in 3 months Take tamoxifen at about the same time every day with food  Thank you for choosing [facility] to provide your oncology and hematology care.  To afford each patient quality time with our provider, please arrive at least 15 minutes before your scheduled appointment time.    You need to re-schedule your appointment should you arrive 10 or more minutes late.  We strive to give you quality time with our providers, and arriving late affects you and other patients whose appointments are after yours.  Also, if you no show three or more times for appointments you may be dismissed from the clinic at the providers discretion.     Again, thank you for choosing [facility].  Our hope is that these requests will decrease the amount of time that you wait before being seen by our physicians.       _____________________________________________________________  Should you have questions after your visit to [facility], please contact our office at [phone] between the hours of 8:30 a.m. and 4:30 p.m.  Voicemails left after 4:30 p.m. will not be returned until the following business day.  For prescription refill requests, have your pharmacy contact our office.

## 2014-12-20 NOTE — Progress Notes (Signed)
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 N0 M0 stage IA  ER+ (100%), PR+ (71%), Her-2 negative  Core needle biopsy of L breast lesion c/w fibroadenoma  Oncotype DX score 24, 16% risk of recurrence    Breast cancer of lower-inner quadrant of right female breast   06/07/2014 Initial Biopsy Right breast needle biopsy 5:00 position: Invasive ductal carcinoma with DCIS, ER 100%, PR 71%, Ki-67 33%, HER-2 negative ratio 1.03   06/17/2014 Breast MRI Right breast: 10 x 7 x 5 mm biopsy-proven IDC with DCIS, left breast 7 x 7 x 7 mm fibroadenoma, left upper quadrant of the abdomen abutting the peritoneum 3 x 1.1 cm oval soft tissue mass   07/27/2014 Surgery Right breast mastectomy: Invasive ductal carcinoma grade 3, 2 cm, intermediate grade DCIS, lymphovascular invasion identified, 2 SLN negative, T1 C. N0 M0 stage IA, ER positive, PR 7%, HER-2 negative, Ki-67 33%   07/27/2014 Oncotype testing Recurrence Score of 24, placing patient in the intermediate risk group   09/06/2014 -  Chemotherapy Taxotere/Cytoxan    09/08/2014 Adverse Reaction Nasuea and voming three times x 2 days.  Added Aloxi and Emend to anti-emetic regimen.   10/18/2014 -  Chemotherapy Zoladex   10/27/2014 Adverse Reaction Hives, secondary to Zoladex?     CURRENT THERAPY: Tamoxifen  INTERVAL HISTORY: Ronan comes in today for follow-up of a right breast cancer, ER positive, PR positive and HER-2 negative. She underwent a mastectomy she has additional surgery in regards to her reconstruction on the 12th. Her husband states that she needs some patience. She is just anxious to get back to work.  She is inquiring whether or not she can just stop tamoxifen. She was given Zoladex and within 24 hours  develops severe facial swelling and hives. She was given IV Solu-Medrol and Benadryl with recurrence of her hives and facial swelling several days later. She wishes to quit tamoxifen because she gets mild nausea when she takes it.   MEDICAL HISTORY: Past Medical History  Diagnosis Date  . Scoliosis   . Breast cancer     2015  . Anemia     before   . Hives Mar 3rd 2016  . GERD (gastroesophageal reflux disease)     has Breast cancer of lower-inner quadrant of right female breast; Genetic testing; Oral candidiasis; Hives; and Acquired absence of breast and nipple on her problem list.    Breast cancer risk profile:  She menarched at age of 71  She had one pregnancy, her first child was born at age 6  She is currently on Depo Provera shots for unknown time in the [facility] She was never exposed to fertility medications or hormone replacement therapy.  She has family history of Breast/GYN/GI cancer; 2 half-sisters with breast cancer    is allergic to oxycodone.  Ms. Theissen had no medications administered during this visit.  SURGICAL HISTORY: Past Surgical History  Procedure Laterality Date  . Cesarean section  1996  . Wisdom tooth extraction    . Mastectomy w/ sentinel node biopsy Right 07/27/2014    Procedure: RIGHT MASTECTOMY WITH RIGHT AXILLARY SENTINEL LYMPH NODE BIOPSY;  Surgeon: Alphonsa Overall, MD;  Location: [place];  Service: General;  Laterality: Right;  . Latissimus flap to breast  Right 07/27/2014    Procedure: TRAM FLAP RECONSTUCTION RIGHT CHEST;  Surgeon: Irene Limbo, MD;  Location: [place];  Service: Plastics;  Laterality: Right;  . Portacath placement Left 09/05/2013  . Breast reduction surgery Left 12/06/2014    Procedure: LEFT BREAST REDUCTION FOR ASYMMETRY;  Surgeon: Irene Limbo, MD;  Location: [place];  Service: Plastics;  Laterality: Left;  . Breast reconstruction Right 12/06/2014    Procedure: RIGHT NIPPLE AREOLAR RECONSTRUCTION  WITH FULL THICKNESS SKIN GRAFT FROM RIGHT THIGH;  Surgeon: Irene Limbo, MD;  Location: [place];  Service: Plastics;  Laterality: Right;  . Port-a-cath removal Left 12/06/2014    Procedure: REMOVAL PORT-A-CATH;  Surgeon: Irene Limbo, MD;  Location: [place];  Service: Plastics;  Laterality: Left;    SOCIAL HISTORY: History   Social History  . Marital Status: Married    Spouse Name: N/A  . Number of Children: 1  . Years of Education: N/A   Occupational History  . Not on file.   Social History Main Topics  . Smoking status: Never Smoker   . Smokeless tobacco: Never Used  . Alcohol Use: No  . Drug Use: No  . Sexual Activity: Not on file   Other Topics Concern  . Not on file   Social History Narrative    FAMILY HISTORY: Family History  Problem Relation Age of Onset  . Breast cancer Paternal Aunt 48  . Heart attack Father   . Breast cancer Maternal Aunt 61  . Breast cancer Sister 38  . Breast cancer Maternal Aunt 54    Review of Systems  Constitutional: Negative for fever, chills, weight loss and malaise/fatigue.  HENT: Negative for congestion, hearing loss, nosebleeds, sore throat and tinnitus.   Eyes: Negative for blurred vision, double vision, pain and discharge.  Respiratory: Negative for cough, hemoptysis, sputum production, shortness of breath and wheezing.   Cardiovascular: Negative for chest pain, palpitations, claudication, leg swelling and PND.  Gastrointestinal: Positive for nausea. Negative for heartburn, vomiting, abdominal pain, diarrhea, constipation, blood in stool and melena.  Genitourinary: Negative for dysuria, urgency, frequency and hematuria.  Musculoskeletal: Negative for myalgias, joint pain and falls.  Skin: Negative for itching and rash.  Neurological: Negative for dizziness, tingling, tremors, sensory change, speech change, focal weakness, seizures, loss of consciousness, weakness and headaches.    Endo/Heme/Allergies: Does not bruise/bleed easily.  Psychiatric/Behavioral: Negative for depression, suicidal ideas, memory loss and substance abuse. The patient is not nervous/anxious and does not have insomnia.     PHYSICAL EXAMINATION  ECOG PERFORMANCE STATUS: 0 - Asymptomatic  Filed Vitals:   12/20/14 1105  BP: 111/65  Pulse: 79  Temp: 98.8 F (37.1 C)  Resp: 18    Physical Exam  Constitutional: She is oriented to person, place, and time and well-developed, well-nourished, and in no distress.  HENT:  Head: Normocephalic and atraumatic.  Nose: Nose normal.  Mouth/Throat: Oropharynx is clear and moist. No oropharyngeal exudate.  Eyes: Conjunctivae and EOM are normal. Pupils are equal, round, and reactive to light. Right eye exhibits no discharge. Left eye exhibits no discharge. No scleral icterus.  Neck: Normal range of motion. Neck supple. No tracheal deviation present. No thyromegaly present.  Cardiovascular: Normal rate, regular rhythm and normal heart sounds.  Exam reveals no gallop and no friction rub.   No murmur heard. Pulmonary/Chest: Effort normal and breath sounds normal. She has no wheezes. She has no rales.  Abdominal: Soft. Bowel sounds are normal. She  exhibits no distension and no mass. There is no tenderness. There is no rebound and no guarding.  Musculoskeletal: Normal range of motion. She exhibits no edema.  Lymphadenopathy:    She has no cervical adenopathy.  Neurological: She is alert and oriented to person, place, and time. She has normal reflexes. No cranial nerve deficit. Gait normal. Coordination normal.  Skin: Skin is warm and dry. No rash noted.  Psychiatric: Mood, memory, affect and judgment normal.  Nursing note and vitals reviewed.   LABORATORY DATA:  CBC    Component Value Date/Time   WBC 26.6* 11/08/2014 0920   WBC 7.6 09/05/2014 0914   RBC 3.71* 11/08/2014 0920   RBC 4.17 09/05/2014 0914   HGB 11.2* 12/06/2014 0652   HGB 11.7  09/05/2014 0914   HCT 31.5* 11/08/2014 0920   HCT 35.4 09/05/2014 0914   PLT 305 11/08/2014 0920   PLT 297 09/05/2014 0914   MCV 84.9 11/08/2014 0920   MCV 84.9 09/05/2014 0914   MCH 28.3 11/08/2014 0920   MCH 28.1 09/05/2014 0914   MCHC 33.3 11/08/2014 0920   MCHC 33.1 09/05/2014 0914   RDW 19.9* 11/08/2014 0920   RDW 14.9* 09/05/2014 0914   LYMPHSABS 0.5* 11/08/2014 0920   LYMPHSABS 1.6 09/05/2014 0914   MONOABS 0.9 11/08/2014 0920   MONOABS 0.5 09/05/2014 0914   EOSABS 0.0 11/08/2014 0920   EOSABS 0.2 09/05/2014 0914   BASOSABS 0.0 11/08/2014 0920   BASOSABS 0.0 09/05/2014 0914   CMP     Component Value Date/Time   NA 141 11/08/2014 0920   NA 139 09/05/2014 0914   K 3.6 11/08/2014 0920   K 3.8 09/05/2014 0914   CL 106 11/08/2014 0920   CO2 26 11/08/2014 0920   CO2 29 09/05/2014 0914   GLUCOSE 142* 11/08/2014 0920   GLUCOSE 88 09/05/2014 0914   BUN 14 11/08/2014 0920   BUN 6.0* 09/05/2014 0914   CREATININE 0.79 11/08/2014 0920   CREATININE 0.8 09/05/2014 0914   CALCIUM 9.0 11/08/2014 0920   CALCIUM 9.3 09/05/2014 0914   PROT 6.1 11/08/2014 0920   PROT 6.6 09/05/2014 0914   ALBUMIN 3.3* 11/08/2014 0920   ALBUMIN 3.4* 09/05/2014 0914   AST 20 11/08/2014 0920   AST 16 09/05/2014 0914   ALT 14 11/08/2014 0920   ALT 9 09/05/2014 0914   ALKPHOS 74 11/08/2014 0920   ALKPHOS 83 09/05/2014 0914   BILITOT 0.3 11/08/2014 0920   BILITOT 0.62 09/05/2014 0914   GFRNONAA >90 11/08/2014 0920   GFRAA >90 11/08/2014 0920     ASSESSMENT and THERAPY PLAN:  Stage IA ER positive, PR positive, HER-2 negative carcinoma of the right breast Mastectomy followed by TRAM flap Significant allergic reaction to Zoladex with facial swelling and hives, required treatment with steroids and Benadryl over a two-week period Mild nausea with tamoxifen  I addressed with Danecia the significant benefit of endocrine therapy for treatment of her primary breast cancer and prevention of  recurrence. I discussed with her additional ways she could take tamoxifen to alleviate her nausea and advised her she could also take an anti-emetic 1 hour to 30 minutes prior to her tamoxifen therapy.  We will continue to follow her hormone levels moving forward and if she remains without menstrual cycle for greater than 1 year and hormone levels change to postmenopausal status we can change her therapy at that point. I would not feel comfortable challenging her with Zoladex.  I have advised her to call in  the next several weeks if she finds she cannot tolerate her tamoxifen and I will bring her back in.  We are going to refer her to OB/GYN to establish with a gynecologist. I have encouraged her to also establish with a primary care physician. I will tentatively plan on seeing her back in 3 months.  All questions were answered. The patient knows to call the clinic with any problems, questions or concerns. We can certainly see the patient much sooner if necessary.  This document serves as a record of services personally performed by Ancil Linsey, MD. It was created on her behalf by Pearlie Oyster, a trained medical scribe. The creation of this record is based on the scribe's personal observations and the provider's statements to them. This document has been checked and approved by the attending provider.      Molli Hazard, MD

## 2014-12-20 NOTE — Progress Notes (Signed)
Please see doctors encounter for more inforamtion 

## 2014-12-20 NOTE — Progress Notes (Signed)
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 N0 M0 stage IA  ER+ (100%), PR+ (71%), Her-2 negative  Core needle biopsy of L breast lesion c/w fibroadenoma  Oncotype DX score 24, 16% risk of recurrence  Monthly Zoladex  CURRENT THERAPY: To Start TC 09/06/2013        Monthly Zoladex for ovarian suppression  INTERVAL HISTORY: Lydia Stevens comes in today as a work in secondary to severe itching and a rash. She woke up this morning noticing that her face and left arm were extremely itchy. She then noticed a rash over the left upper extremity on her face. She also noted significant eyes swelling and itchiness. She denies any new medications in the last several days, no change in laundry soaps, she denies any change in her normal routine.  MEDICAL HISTORY: Past Medical History  Diagnosis Date  . Scoliosis   . Breast cancer     2015  . Anemia     before   . Hives Mar 3rd 2016  . GERD (gastroesophageal reflux disease)     has Breast cancer of lower-inner quadrant of right female breast; Genetic testing; Oral candidiasis; Hives; and Acquired absence of breast and nipple on her problem list.      Breast cancer of lower-inner quadrant of right female breast   06/07/2014 Initial Biopsy Right breast needle biopsy 5:00 position: Invasive ductal carcinoma with DCIS, ER 100%, PR 71%, Ki-67 33%, HER-2 negative ratio 1.03   06/17/2014 Breast MRI Right breast: 10 x 7 x 5 mm biopsy-proven IDC with DCIS, left breast 7 x 7 x 7 mm fibroadenoma, left upper quadrant of the abdomen abutting the peritoneum 3 x 1.1 cm oval soft tissue mass   07/27/2014 Surgery Right breast mastectomy: Invasive ductal carcinoma grade 3, 2 cm, intermediate grade DCIS, lymphovascular invasion  identified, 2 SLN negative, T1 C. N0 M0 stage IA, ER positive, PR 7%, HER-2 negative, Ki-67 33%   07/27/2014 Oncotype testing Recurrence Score of 24, placing patient in the intermediate risk group   09/06/2014 -  Chemotherapy Taxotere/Cytoxan    09/08/2014 Adverse Reaction Nasuea and voming three times x 2 days.  Added Aloxi and Emend to anti-emetic regimen.   10/18/2014 -  Chemotherapy Zoladex   10/27/2014 Adverse Reaction Hives, secondary to Zoladex?   Breast cancer risk profile:  She menarched at age of 50  She had one pregnancy, her first child was born at age 31  She is currently on Depo Provera shots for unknown time in the [facility] She was never exposed to fertility medications or hormone replacement therapy.  She has family history of Breast/GYN/GI cancer; 2 half-sisters with breast cancer    is allergic to oxycodone.  Ms. Ailey had no medications administered during this visit.  SURGICAL HISTORY: Past Surgical History  Procedure Laterality Date  . Cesarean section  1996  . Wisdom tooth extraction    . Mastectomy w/ sentinel node biopsy Right 07/27/2014    Procedure: RIGHT MASTECTOMY WITH RIGHT AXILLARY SENTINEL LYMPH NODE BIOPSY;  Surgeon: Alphonsa Overall, MD;  Location: [place];  Service: General;  Laterality: Right;  . Latissimus flap to breast Right 07/27/2014    Procedure: TRAM FLAP RECONSTUCTION RIGHT CHEST;  Surgeon: Irene Limbo, MD;  Location: [place];  Service: Clinical cytogeneticist;  Laterality: Right;  . Portacath placement Left 09/05/2013  . Breast reduction surgery Left 12/06/2014    Procedure: LEFT BREAST REDUCTION FOR ASYMMETRY;  Surgeon: Irene Limbo, MD;  Location: [place];  Service: Plastics;  Laterality: Left;  . Breast reconstruction Right 12/06/2014    Procedure: RIGHT NIPPLE AREOLAR RECONSTRUCTION WITH FULL THICKNESS SKIN GRAFT FROM RIGHT THIGH;  Surgeon: Irene Limbo, MD;  Location: [place];  Service: Plastics;   Laterality: Right;  . Port-a-cath removal Left 12/06/2014    Procedure: REMOVAL PORT-A-CATH;  Surgeon: Irene Limbo, MD;  Location: [place];  Service: Plastics;  Laterality: Left;    SOCIAL HISTORY: History   Social History  . Marital Status: Married    Spouse Name: N/A  . Number of Children: 1  . Years of Education: N/A   Occupational History  . Not on file.   Social History Main Topics  . Smoking status: Never Smoker   . Smokeless tobacco: Never Used  . Alcohol Use: No  . Drug Use: No  . Sexual Activity: Not on file   Other Topics Concern  . Not on file   Social History Narrative    FAMILY HISTORY: Family History  Problem Relation Age of Onset  . Breast cancer Paternal Aunt 52  . Heart attack Father   . Breast cancer Maternal Aunt 83  . Breast cancer Sister 75  . Breast cancer Maternal Aunt 54    Review of Systems  Constitutional: Negative for fever, chills, weight loss and malaise/fatigue.  HENT: Negative for congestion, hearing loss, nosebleeds, sore throat and tinnitus.   Eyes: Negative for blurred vision, double vision, pain and discharge.  Respiratory: Negative for cough, hemoptysis, sputum production, shortness of breath and wheezing.   Cardiovascular: Negative for chest pain, palpitations, claudication, leg swelling and PND.  Gastrointestinal: Negative for heartburn, nausea, vomiting, abdominal pain, diarrhea, constipation, blood in stool and melena.  Genitourinary: Negative for dysuria, urgency, frequency and hematuria.  Musculoskeletal: Negative for myalgias, joint pain and falls.  Skin: Negative for itching and rash.  Neurological: Negative for dizziness, tingling, tremors, sensory change, speech change, focal weakness, seizures, loss of consciousness, weakness and headaches.  Endo/Heme/Allergies: Does not bruise/bleed easily.  Psychiatric/Behavioral: Negative for depression, suicidal ideas, memory loss and substance abuse. The  patient is not nervous/anxious and does not have insomnia.     PHYSICAL EXAMINATION  ECOG PERFORMANCE STATUS: 0 - Asymptomatic  Filed Vitals:   12/20/14 1105  BP: 111/65  Pulse: 79  Temp: 98.8 F (37.1 C)  Resp: 18    Physical Exam  Constitutional: She is oriented to person, place, and time and well-developed, well-nourished, and in no distress.  HENT:  Head: Normocephalic and atraumatic.  Nose: Nose normal.  Mouth/Throat: Oropharynx is clear and moist. No oropharyngeal exudate.  Eye swelling and mild erythema, no drainage  Eyes: Conjunctivae and EOM are normal. Pupils are equal, round, and reactive to light. Right eye exhibits no discharge. Left eye exhibits no discharge. No scleral icterus.  Neck: Normal range of motion. Neck supple. No tracheal deviation present. No thyromegaly present.  Cardiovascular: Normal rate, regular rhythm and normal heart sounds.  Exam reveals no gallop and no friction rub.   No murmur heard. Pulmonary/Chest: Effort normal and breath sounds normal. She has no wheezes. She has no rales.  Lymphadenopathy:    She has no cervical adenopathy.  Neurological: She is alert and oriented to person, place, and time. She has  normal reflexes. No cranial nerve deficit. Coordination normal.  Skin: Skin is warm and dry. Rash noted.  Hives over posterior L arm and shoulder, hives on face and neck  Psychiatric: Mood, memory, affect and judgment normal.  Nursing note and vitals reviewed.   LABORATORY DATA:  CBC    Component Value Date/Time   WBC 26.6* 11/08/2014 0920   WBC 7.6 09/05/2014 0914   RBC 3.71* 11/08/2014 0920   RBC 4.17 09/05/2014 0914   HGB 11.2* 12/06/2014 0652   HGB 11.7 09/05/2014 0914   HCT 31.5* 11/08/2014 0920   HCT 35.4 09/05/2014 0914   PLT 305 11/08/2014 0920   PLT 297 09/05/2014 0914   MCV 84.9 11/08/2014 0920   MCV 84.9 09/05/2014 0914   MCH 28.3 11/08/2014 0920   MCH 28.1 09/05/2014 0914   MCHC 33.3 11/08/2014 0920   MCHC 33.1  09/05/2014 0914   RDW 19.9* 11/08/2014 0920   RDW 14.9* 09/05/2014 0914   LYMPHSABS 0.5* 11/08/2014 0920   LYMPHSABS 1.6 09/05/2014 0914   MONOABS 0.9 11/08/2014 0920   MONOABS 0.5 09/05/2014 0914   EOSABS 0.0 11/08/2014 0920   EOSABS 0.2 09/05/2014 0914   BASOSABS 0.0 11/08/2014 0920   BASOSABS 0.0 09/05/2014 0914   CMP     Component Value Date/Time   NA 141 11/08/2014 0920   NA 139 09/05/2014 0914   K 3.6 11/08/2014 0920   K 3.8 09/05/2014 0914   CL 106 11/08/2014 0920   CO2 26 11/08/2014 0920   CO2 29 09/05/2014 0914   GLUCOSE 142* 11/08/2014 0920   GLUCOSE 88 09/05/2014 0914   BUN 14 11/08/2014 0920   BUN 6.0* 09/05/2014 0914   CREATININE 0.79 11/08/2014 0920   CREATININE 0.8 09/05/2014 0914   CALCIUM 9.0 11/08/2014 0920   CALCIUM 9.3 09/05/2014 0914   PROT 6.1 11/08/2014 0920   PROT 6.6 09/05/2014 0914   ALBUMIN 3.3* 11/08/2014 0920   ALBUMIN 3.4* 09/05/2014 0914   AST 20 11/08/2014 0920   AST 16 09/05/2014 0914   ALT 14 11/08/2014 0920   ALT 9 09/05/2014 0914   ALKPHOS 74 11/08/2014 0920   ALKPHOS 83 09/05/2014 0914   BILITOT 0.3 11/08/2014 0920   BILITOT 0.62 09/05/2014 0914   GFRNONAA >90 11/08/2014 0920   GFRAA >90 11/08/2014 0920       ASSESSMENT and THERAPY PLAN:    No problem-specific assessment & plan notes found for this encounter. All questions were answered. The patient knows to call the clinic with any problems, questions or concerns. We can certainly see the patient much sooner if necessary. Molli Hazard 12/20/2014

## 2014-12-20 NOTE — Progress Notes (Deleted)
No PCP Per Patient No address on file  No diagnosis found.  CURRENT THERAPY: TC chemotherapy, here for cycle #4.  Additionally on monthly Zoladex for Ovarian suppression.  INTERVAL HISTORY: Lydia Stevens 44 y.o. female returns for followup of Stage IA (T1C,N0,M0) right breast invasive ductal carcinoma (with associated DCIS) status post mastectomy 07/27/2014 followed by reconstruction with TRAM flap.  Tumor was 2 cm in size with intermediate grade DCIS, lymphovascular invasion noted, 2 SLN negative, grade 3.  ER+ (100%), PR+ (71%), Her-2 negative.Core needle biopsy of L breast lesion c/w fibroadenoma. Oncotype DX score 24, 16% risk of recurrence.    Breast cancer of lower-inner quadrant of right female breast   06/07/2014 Initial Biopsy Right breast needle biopsy 5:00 position: Invasive ductal carcinoma with DCIS, ER 100%, PR 71%, Ki-67 33%, HER-2 negative ratio 1.03   06/17/2014 Breast MRI Right breast: 10 x 7 x 5 mm biopsy-proven IDC with DCIS, left breast 7 x 7 x 7 mm fibroadenoma, left upper quadrant of the abdomen abutting the peritoneum 3 x 1.1 cm oval soft tissue mass   07/27/2014 Surgery Right breast mastectomy: Invasive ductal carcinoma grade 3, 2 cm, intermediate grade DCIS, lymphovascular invasion identified, 2 SLN negative, T1 C. N0 M0 stage IA, ER positive, PR 7%, HER-2 negative, Ki-67 33%   07/27/2014 Oncotype testing Recurrence Score of 24, placing patient in the intermediate risk group   09/06/2014 -  Chemotherapy Taxotere/Cytoxan    09/08/2014 Adverse Reaction Nasuea and voming three times x 2 days.  Added Aloxi and Emend to anti-emetic regimen.   10/18/2014 -  Chemotherapy Zoladex   10/27/2014 Adverse Reaction Hives, secondary to Zoladex?    I personally reviewed and went over laboratory results with the patient.  The results are noted within this dictation.  Chart reviewed.  Episode of severe itching and rash observed requiring her to come into the clinic for  evaluation.  Suspect it was secondary to Zoladex.  Therefore, we will not rechallange her with this medication and instead, we will treat with Tamoxifen since she is pre-menopausal.  I provided her education regarding Tamoxifen.  She asked about birth control options and she is educated about barrier protection, such as condoms, but for other options, I will defer this for 6 weeks until she returns.  It is important to establish tolerability of Tamoxifen before discussing further birth control options.  Other options include Progesterone containing products and/or TAH-BSO which would allow [name] to use AI therapy which has been shown to be superior than Tamoxifen per SOFT trial.   She is excited that this is her last treatment and she is encouraged to ring our [employer].    Oncologically, she denies any complaints and ROS questioning is negative.    Past Medical History  Diagnosis Date  . Scoliosis   . Breast cancer     2015  . Anemia     before   . Hives Mar 3rd 2016  . GERD (gastroesophageal reflux disease)     has Breast cancer of lower-inner quadrant of right female breast; Genetic testing; Oral candidiasis; Hives; and Acquired absence of breast and nipple on her problem list.     is allergic to oxycodone.  Ms. Harlan does not currently have medications on file.  Past Surgical History  Procedure Laterality Date  . Cesarean section  1996  . Wisdom tooth extraction    . Mastectomy w/ sentinel node biopsy Right 07/27/2014  Procedure: RIGHT MASTECTOMY WITH RIGHT AXILLARY SENTINEL LYMPH NODE BIOPSY;  Surgeon: Alphonsa Overall, MD;  Location: [place];  Service: General;  Laterality: Right;  . Latissimus flap to breast Right 07/27/2014    Procedure: TRAM FLAP RECONSTUCTION RIGHT CHEST;  Surgeon: Irene Limbo, MD;  Location: [place];  Service: Plastics;  Laterality: Right;  . Portacath placement Left 09/05/2013  . Breast reduction surgery Left 12/06/2014    Procedure: LEFT BREAST  REDUCTION FOR ASYMMETRY;  Surgeon: Irene Limbo, MD;  Location: [place];  Service: Plastics;  Laterality: Left;  . Breast reconstruction Right 12/06/2014    Procedure: RIGHT NIPPLE AREOLAR RECONSTRUCTION WITH FULL THICKNESS SKIN GRAFT FROM RIGHT THIGH;  Surgeon: Irene Limbo, MD;  Location: [place];  Service: Plastics;  Laterality: Right;  . Port-a-cath removal Left 12/06/2014    Procedure: REMOVAL PORT-A-CATH;  Surgeon: Irene Limbo, MD;  Location: [place];  Service: Plastics;  Laterality: Left;    Denies any headaches, dizziness, double vision, fevers, chills, night sweats, nausea, vomiting, diarrhea, constipation, chest pain, heart palpitations, shortness of breath, blood in stool, black tarry stool, urinary pain, urinary burning, urinary frequency, hematuria.   PHYSICAL EXAMINATION  ECOG PERFORMANCE STATUS: 0 - Asymptomatic  There were no vitals filed for this visit.  GENERAL:alert, healthy, no distress, well nourished, well developed, comfortable, cooperative and smiling, accompanied by her husband. SKIN: skin color, texture, turgor are normal, no rashes or significant lesions HEAD: Normocephalic, No masses, lesions, tenderness or abnormalities EYES: normal, PERRLA, EOMI, Conjunctiva are pink and non-injected EARS: External ears normal OROPHARYNX:lips, buccal mucosa, and tongue normal and mucous membranes are moist  NECK: supple, trachea midline LYMPH:  no palpable lymphadenopathy BREAST:not examined LUNGS: clear to auscultation  HEART: regular rate & rhythm, no murmurs and no gallops ABDOMEN:abdomen soft, non-tender and normal bowel sounds BACK: Back symmetric, no curvature. EXTREMITIES:less then 2 second capillary refill, no joint deformities, effusion, or inflammation, no edema, no skin discoloration, no clubbing, no cyanosis  NEURO: alert & oriented x 3 with fluent speech, no focal motor/sensory deficits, gait  normal   LABORATORY DATA: CBC    Component Value Date/Time   WBC 26.6* 11/08/2014 0920   WBC 7.6 09/05/2014 0914   RBC 3.71* 11/08/2014 0920   RBC 4.17 09/05/2014 0914   HGB 11.2* 12/06/2014 0652   HGB 11.7 09/05/2014 0914   HCT 31.5* 11/08/2014 0920   HCT 35.4 09/05/2014 0914   PLT 305 11/08/2014 0920   PLT 297 09/05/2014 0914   MCV 84.9 11/08/2014 0920   MCV 84.9 09/05/2014 0914   MCH 28.3 11/08/2014 0920   MCH 28.1 09/05/2014 0914   MCHC 33.3 11/08/2014 0920   MCHC 33.1 09/05/2014 0914   RDW 19.9* 11/08/2014 0920   RDW 14.9* 09/05/2014 0914   LYMPHSABS 0.5* 11/08/2014 0920   LYMPHSABS 1.6 09/05/2014 0914   MONOABS 0.9 11/08/2014 0920   MONOABS 0.5 09/05/2014 0914   EOSABS 0.0 11/08/2014 0920   EOSABS 0.2 09/05/2014 0914   BASOSABS 0.0 11/08/2014 0920   BASOSABS 0.0 09/05/2014 0914      Chemistry      Component Value Date/Time   NA 141 11/08/2014 0920   NA 139 09/05/2014 0914   K 3.6 11/08/2014 0920   K 3.8 09/05/2014 0914   CL 106 11/08/2014 0920   CO2 26 11/08/2014 0920   CO2 29 09/05/2014 0914   BUN 14 11/08/2014 0920   BUN 6.0* 09/05/2014 0914   CREATININE 0.79 11/08/2014  0920   CREATININE 0.8 09/05/2014 0914      Component Value Date/Time   CALCIUM 9.0 11/08/2014 0920   CALCIUM 9.3 09/05/2014 0914   ALKPHOS 74 11/08/2014 0920   ALKPHOS 83 09/05/2014 0914   AST 20 11/08/2014 0920   AST 16 09/05/2014 0914   ALT 14 11/08/2014 0920   ALT 9 09/05/2014 0914   BILITOT 0.3 11/08/2014 0920   BILITOT 0.62 09/05/2014 0914       ASSESSMENT AND PLAN:  No problem-specific assessment & plan notes found for this encounter. THERAPY PLAN:  Continue with therapy as planned.  All questions were answered. The patient knows to call the clinic with any problems, questions or concerns. We can certainly see the patient much sooner if necessary.  Patient and plan discussed with Dr. Ancil Linsey and she is in agreement with the aforementioned.   This note is  electronically signed by: Molli Hazard 12/20/2014 8:16 AM

## 2014-12-29 ENCOUNTER — Encounter: Payer: Self-pay | Admitting: Adult Health

## 2014-12-29 ENCOUNTER — Ambulatory Visit (INDEPENDENT_AMBULATORY_CARE_PROVIDER_SITE_OTHER): Payer: 59 | Admitting: Adult Health

## 2014-12-29 ENCOUNTER — Other Ambulatory Visit (HOSPITAL_COMMUNITY)
Admission: RE | Admit: 2014-12-29 | Discharge: 2014-12-29 | Disposition: A | Discharge status: Home / Self Care | Payer: 59 | Source: Ambulatory Visit | Attending: Adult Health | Admitting: Adult Health

## 2014-12-29 VITALS — BP 120/70 | HR 80 | Ht 67.5 in | Wt 187.0 lb

## 2014-12-29 DIAGNOSIS — Z01419 Encounter for gynecological examination (general) (routine) without abnormal findings: Secondary | ICD-10-CM

## 2014-12-29 DIAGNOSIS — Z1212 Encounter for screening for malignant neoplasm of rectum: Secondary | ICD-10-CM

## 2014-12-29 DIAGNOSIS — Z1151 Encounter for screening for human papillomavirus (HPV): Secondary | ICD-10-CM | POA: Insufficient documentation

## 2014-12-29 DIAGNOSIS — Z853 Personal history of malignant neoplasm of breast: Secondary | ICD-10-CM

## 2014-12-29 HISTORY — DX: Personal history of malignant neoplasm of breast: Z85.3

## 2014-12-29 LAB — HEMOCCULT GUIAC POC 1CARD (OFFICE): Fecal Occult Blood, POC: NEGATIVE

## 2014-12-29 NOTE — Patient Instructions (Signed)
Return in 1 week for surgery talk with Dr Glo Herring  Physical in 1 year

## 2014-12-29 NOTE — Progress Notes (Signed)
Patient ID: Lydia Stevens, female   DOB: 06/26/1971, 43 y.o.   MRN: [id number] History of Present Illness: Lydia Stevens is a 44 year old black female married, in for well woman gyn exam and pap.She recently had breast cancer with right mastectomy and tram flap reconstruction and breast reduction of left breast.She took chemo and is on tamoxifen now.Was ER +.   Current Medications, Allergies, Past Medical History, Past Surgical History, Family History and Social History were reviewed in [employer] record.     Review of Systems: Patient denies any headaches, hearing loss, fatigue, blurred vision, shortness of breath, chest pain, abdominal pain, problems with bowel movements, urination, or intercourse(not having sex). No joint pain or mood swings.Has not had period since chemo.Last chemo was in March.    Physical Exam:BP 120/70 mmHg  Pulse 80  Ht 5' 7.5" (1.715 m)  Wt 187 lb (84.823 kg)  BMI 28.84 kg/m2  LMP 08/14/2014 General:  Well developed, well nourished, no acute distress Skin:  Warm and dry Neck:  Midline trachea, normal thyroid, good ROM, no lymphadenopathy Lungs; Clear to auscultation bilaterally Breast:  No dominant palpable mass, retraction, or nipple discharge, incisions still healing and right nipple dark, has F/U next week with surgeon  Cardiovascular: Regular rate and rhythm Abdomen:  Soft, non tender, no hepatosplenomegaly,incisions healing Pelvic:  External genitalia is normal in appearance, no lesions.  The vagina is normal in appearance. Urethra has no lesions or masses. The cervix is smooth, pap with HPV performed.  Uterus is felt to be normal size, shape, and contour.  No adnexal masses or tenderness noted.Bladder is non tender, no masses felt. Rectal: Good sphincter tone, no polyps, or hemorrhoids felt.  Hemoccult negative. Extremities/musculoskeletal:  No swelling or varicosities noted, no clubbing or cyanosis Psych:  No mood changes, alert and  cooperative,seems happy Discussed having BSO and tubes removed, has had oncologist suggest it too.  Impression: Well woman gyn exam with pap History of breast cancer  Plan: Return in 1 week to discuss BSO and tube removal with Dr Glo Herring Physical in 1 year Call Dr Buelah Manis to see if will see as PCP Use condoms for now,I gave 1 Female condom

## 2015-01-03 ENCOUNTER — Ambulatory Visit (INDEPENDENT_AMBULATORY_CARE_PROVIDER_SITE_OTHER): Payer: 59 | Admitting: Obstetrics and Gynecology

## 2015-01-03 ENCOUNTER — Encounter: Payer: Self-pay | Admitting: Obstetrics and Gynecology

## 2015-01-03 VITALS — BP 126/80 | Ht 67.5 in | Wt 188.0 lb

## 2015-01-03 DIAGNOSIS — N951 Menopausal and female climacteric states: Secondary | ICD-10-CM | POA: Diagnosis not present

## 2015-01-03 LAB — CYTOLOGY - PAP

## 2015-01-04 ENCOUNTER — Telehealth: Payer: Self-pay | Admitting: Obstetrics and Gynecology

## 2015-01-04 ENCOUNTER — Ambulatory Visit: Payer: 59 | Admitting: Obstetrics and Gynecology

## 2015-01-04 NOTE — Progress Notes (Signed)
[facility] Visit  Patient name: Lydia Stevens MRN [id number]  Date of birth: [birth year]  CC & HPI:  Lydia Stevens is a 44 y.o. female presenting today for consideration of producing  Surgical menopause so she can take Aromatase Inhibitor for the breast cancer.Right mastectomy with tram flap done in December 2015. Recently s/p nipple graft on rt an left reduction mammoplasty. Breast nipple healing slowly on right.  ROS:  6 wk s/p red'n mammoplasty on left and donor graft from inguinal crease to right nipple area of Tram Flap.  Pertinent History Reviewed:   Reviewed: Significant for  Medical         Past Medical History  Diagnosis Date  . Scoliosis   . Breast cancer     2015  . Anemia     before   . Hives Mar 3rd 2016  . GERD (gastroesophageal reflux disease)   . History of breast cancer 12/29/2014                              Surgical Hx:    Past Surgical History  Procedure Laterality Date  . Cesarean section  1996  . Wisdom tooth extraction    . Mastectomy w/ sentinel node biopsy Right 07/27/2014    Procedure: RIGHT MASTECTOMY WITH RIGHT AXILLARY SENTINEL LYMPH NODE BIOPSY;  Surgeon: Alphonsa Overall, MD;  Location: [place];  Service: General;  Laterality: Right;  . Latissimus flap to breast Right 07/27/2014    Procedure: TRAM FLAP RECONSTUCTION RIGHT CHEST;  Surgeon: Irene Limbo, MD;  Location: [place];  Service: Plastics;  Laterality: Right;  . Portacath placement Left 09/05/2013  . Breast reduction surgery Left 12/06/2014    Procedure: LEFT BREAST REDUCTION FOR ASYMMETRY;  Surgeon: Irene Limbo, MD;  Location: [place];  Service: Plastics;  Laterality: Left;  . Breast reconstruction Right 12/06/2014    Procedure: RIGHT NIPPLE AREOLAR RECONSTRUCTION WITH FULL THICKNESS SKIN GRAFT FROM RIGHT THIGH;  Surgeon: Irene Limbo, MD;  Location: [place];  Service: Plastics;  Laterality: Right;  . Port-a-cath removal Left  12/06/2014    Procedure: REMOVAL PORT-A-CATH;  Surgeon: Irene Limbo, MD;  Location: [place];  Service: Plastics;  Laterality: Left;   Medications: Reviewed & Updated - see associated section                       Current outpatient prescriptions:  .  aspirin-acetaminophen-caffeine (EXCEDRIN MIGRAINE) 250-250-65 MG per tablet, Take 1 tablet by mouth every 6 (six) hours as needed for headache., Disp: , Rfl:  .  ergocalciferol (VITAMIN D2) 50000 UNITS capsule, Take 1 capsule (50,000 Units total) by mouth once a week., Disp: 12 capsule, Rfl: 3 .  naproxen sodium (ANAPROX) 220 MG tablet, Take 220 mg by mouth 2 (two) times daily with a meal., Disp: , Rfl:  .  senna (SENOKOT) 8.6 MG tablet, Take 1 tablet by mouth daily., Disp: , Rfl:  .  tamoxifen (NOLVADEX) 20 MG tablet, Take 1 tablet (20 mg total) by mouth daily., Disp: 30 tablet, Rfl: 5 .  traMADol (ULTRAM) 50 MG tablet, Take 1 tablet (50 mg total) by mouth every 6 (six) hours as needed for moderate pain. (Patient not taking: Reported on 12/29/2014), Disp: 60 tablet, Rfl: 0 No current facility-administered medications for this visit.  Facility-Administered Medications Ordered in Other Visits:  .  diphenhydrAMINE (BENADRYL) injection 50  mg, 50 mg, Intravenous, Once, Patrici Ranks, MD .  methylPREDNISolone sodium succinate (SOLU-MEDROL) 125 mg/2 mL injection 60 mg, 60 mg, Intravenous, Once, Patrici Ranks, MD   Social History: Reviewed -  reports that she has never smoked. She has never used smokeless tobacco.  Objective Findings:  Vitals: Blood pressure 126/80, height 5' 7.5" (1.715 m), weight 188 lb (85.276 kg), last menstrual period 08/14/2014.  Physical Examination: General appearance - alert, well appearing, and in no distress and normal appearing weight Mental status - alert, oriented to person, place, and time, normal mood, behavior, speech, dress, motor activity, and thought processes Chest - clear to  auscultation, no wheezes, rales or rhonchi, symmetric air entry Breasts - , right breast well healed s/p tram flap, but nipple reconstruction is healing slowly from it's surgery 6 wk ago.  Pelvic - VULVA: normal appearing vulva with no masses, tenderness or lesions, VAGINA: normal appearing vagina with normal color and discharge, no lesions, atrophic, CERVIX: normal appearing cervix without discharge or lesions, UTERUS: uterus is normal size, shape, consistency and nontender, ADNEXA: normal adnexa in size, nontender and no masses Extremities - peripheral pulses normal, no pedal edema, no clubbing or cyanosis   Assessment & Plan:   A:  1. Probable menopausal status 2. S/p chemotx for rt breast cancer P:  1. Check FSH, Estradiol, to see if pt is postmenopausal 2 if premenopausal discuss whether pt wants BSO done to allowu use of Aromatase Inhibitors long term  Addendum: Case discussed with Dr Skeet Latch, Gyn Oncology, who feels pt is likely a candidate for  BSO due to strong fam hx multiple premenopausal breast cancers Pt is pushing for BSO to be done before she goes back to work , in order to not have to meet a new disability time restart.  I cannot meet her urgent request for Surgery; will refer to Dr Skeet Latch or Dr Denman George Message to that effect left on pt answer machine.

## 2015-01-05 ENCOUNTER — Telehealth: Payer: Self-pay | Admitting: Nurse Practitioner

## 2015-01-05 NOTE — Telephone Encounter (Signed)
Patient being seen to consult for BSO at request of Dr. Glo Herring. Patient with breast cancer (pt of Dr. Lindi Adie) and positive family history of cancer.  He has discussed the case with Dr. Skeet Latch and she recommends surgery by the end of the month. New pt apt scheduled for tomorrow, 01/06/15 at 11:45 and patient is aware of date, time, and location. Records in [place]. Joylene John, NP notified.

## 2015-01-06 ENCOUNTER — Encounter: Payer: Self-pay | Admitting: Gynecologic Oncology

## 2015-01-06 ENCOUNTER — Ambulatory Visit: Payer: 59 | Attending: Gynecologic Oncology | Admitting: Gynecologic Oncology

## 2015-01-06 VITALS — BP 103/63 | HR 79 | Temp 98.0°F | Resp 18 | Ht 67.5 in | Wt 185.4 lb

## 2015-01-06 DIAGNOSIS — E28310 Symptomatic premature menopause: Secondary | ICD-10-CM

## 2015-01-06 DIAGNOSIS — C50911 Malignant neoplasm of unspecified site of right female breast: Secondary | ICD-10-CM | POA: Insufficient documentation

## 2015-01-06 DIAGNOSIS — Z170 Estrogen receptor positive status [ER+]: Secondary | ICD-10-CM

## 2015-01-06 DIAGNOSIS — Z803 Family history of malignant neoplasm of breast: Secondary | ICD-10-CM

## 2015-01-06 DIAGNOSIS — Z79810 Long term (current) use of selective estrogen receptor modulators (SERMs): Secondary | ICD-10-CM

## 2015-01-06 NOTE — Patient Instructions (Addendum)
Plan for surgery at [facility] on May 27.  You will receive a phone call from [facility] with the name of the surgeon as well.  We will contact you with your pre-operative appointment.  You will be having a minimally invasive (either laparoscopic or robotic) removal of tubes and ovaries.

## 2015-01-06 NOTE — Telephone Encounter (Signed)
Left message to pt that I will NOT be able to do her requested surgery, Bilateral salpingoophorectomy, within her requested time frame, which is the next few days. I have discussed her case with Gyn Oncologist Dr Skeet Latch,, who believes (without having had the opportunity to review the full record) that Pt would probably be a candidate for BSO based on the significant family hx of 3 sisters all with breast cancer under age 43. GYN Oncology has her info, Dr Skeet Latch intends to leave a message., and if she is not contacted by them in the next few days, pt asked to call their clinic for followup. [phone]

## 2015-01-06 NOTE — Progress Notes (Signed)
Consult Note: Gyn-Onc  Consult was requested by Dr. Glo Herring for the evaluation of Lydia Stevens 44 y.o. female with hormone receptor positive breast cancer.  CC:  Chief Complaint  Patient presents with  . breast cancer, right    Assessment/Plan:  Lydia Stevens  is a 44 y.o.  year old with hormone receptor positive breast cancer who's oncologist, Dr. Payton Mccallum, is recommending treatment with aromatase inhibitors as opposed to tamoxifen. I am recommending that we perform a BSO. I believe she would be a good candidate for a minimally invasive approach with either laparoscopy or robotic-assisted laparoscopy. She desires that this be performed within the month and therefore we will facilitate for her to have this procedure performed in [place] with one of my partners.  I discussed operative risks associated with surgery including bleeding, infection, damage to adjacent organs, VTE, conversion to laparotomy, reoperation. He do not believe that she requires a hysterectomy as part of this treatment. She will no longer be on tamoxifen and therefore known to be at increased risk for uterine cancer, and has no uterine pathology at this time. I discussed that hysterectomy is associated with increased recovery and risk but without medical benefit in this case.  I am recommending that her ovaries and tubes be pathologically processed per BRCA protocol given her strong family history of breast cancer and high risk for having an occult BRCA deleterious mutation.  I recommend she be considered for genetic testing postop.  HPI: Lydia Stevens is a very pleasant African-American G1 P1 who is seen in consultation at the request of Dr. Glo Herring for: Receptor positive breast cancer requiring aromatase inhibitor use and need for surgical menopause induction. The patient has a history of right breast cancer in December 2015. For this she's been treated with mastectomy in December 2015, followed by  reconstruction with a TRAM flap. Her tumor was ER, PR positive and HER-2/neu negative. She was treated with monthly Zoladex for ovarian suppression postoperatively. She was treated with chemotherapy with Taxotere Cytoxan with the last chemotherapy administered in March 2016. She is currently taking tamoxifen. She is amenorrheic with symptoms of menopause with hot flashes.  She has a significant family history for cancer including 2 sisters with premenopausal breast cancer, 2 maternal aunts with breast cancer and a paternal aunt with breast cancer. She has not undergone BRCA genetic testing.   Current Meds:  Outpatient Encounter Prescriptions as of 01/06/2015  Medication Sig  . ergocalciferol (VITAMIN D2) 50000 UNITS capsule Take 1 capsule (50,000 Units total) by mouth once a week.  . senna (SENOKOT) 8.6 MG tablet Take 1 tablet by mouth daily.  . tamoxifen (NOLVADEX) 20 MG tablet Take 1 tablet (20 mg total) by mouth daily.  . traMADol (ULTRAM) 50 MG tablet Take 1 tablet (50 mg total) by mouth every 6 (six) hours as needed for moderate pain.  Marland Kitchen aspirin-acetaminophen-caffeine (EXCEDRIN MIGRAINE) 250-250-65 MG per tablet Take 1 tablet by mouth every 6 (six) hours as needed for headache.  . [DISCONTINUED] naproxen sodium (ANAPROX) 220 MG tablet Take 220 mg by mouth 2 (two) times daily with a meal.   Facility-Administered Encounter Medications as of 01/06/2015  Medication  . diphenhydrAMINE (BENADRYL) injection 50 mg  . methylPREDNISolone sodium succinate (SOLU-MEDROL) 125 mg/2 mL injection 60 mg    Allergy:  Allergies  Allergen Reactions  . Oxycodone Nausea Only    Social Hx:   History   Social History  . Marital Status: Married    Spouse  Name: N/A  . Number of Children: 1  . Years of Education: N/A   Occupational History  . Not on file.   Social History Main Topics  . Smoking status: Never Smoker   . Smokeless tobacco: Never Used  . Alcohol Use: No  . Drug Use: No  . Sexual  Activity: Not Currently    Birth Control/ Protection: None   Other Topics Concern  . Not on file   Social History Narrative    Past Surgical Hx:  Past Surgical History  Procedure Laterality Date  . Cesarean section  1996  . Wisdom tooth extraction    . Mastectomy w/ sentinel node biopsy Right 07/27/2014    Procedure: RIGHT MASTECTOMY WITH RIGHT AXILLARY SENTINEL LYMPH NODE BIOPSY;  Surgeon: Alphonsa Overall, MD;  Location: [place];  Service: General;  Laterality: Right;  . Latissimus flap to breast Right 07/27/2014    Procedure: TRAM FLAP RECONSTUCTION RIGHT CHEST;  Surgeon: Irene Limbo, MD;  Location: [place];  Service: Plastics;  Laterality: Right;  . Portacath placement Left 09/05/2013  . Breast reduction surgery Left 12/06/2014    Procedure: LEFT BREAST REDUCTION FOR ASYMMETRY;  Surgeon: Irene Limbo, MD;  Location: [place];  Service: Plastics;  Laterality: Left;  . Breast reconstruction Right 12/06/2014    Procedure: RIGHT NIPPLE AREOLAR RECONSTRUCTION WITH FULL THICKNESS SKIN GRAFT FROM RIGHT THIGH;  Surgeon: Irene Limbo, MD;  Location: [place];  Service: Plastics;  Laterality: Right;  . Port-a-cath removal Left 12/06/2014    Procedure: REMOVAL PORT-A-CATH;  Surgeon: Irene Limbo, MD;  Location: [place];  Service: Plastics;  Laterality: Left;    Past Medical Hx:  Past Medical History  Diagnosis Date  . Scoliosis   . Breast cancer     2015  . Anemia     before   . Hives Mar 3rd 2016  . GERD (gastroesophageal reflux disease)   . History of breast cancer 12/29/2014    Past Gynecological History:  No hx of abnormal paps, C/s x1  Patient's last menstrual period was 08/14/2014.  Family Hx:  Family History  Problem Relation Age of Onset  . Breast cancer Paternal Aunt 13  . Heart attack Father   . Breast cancer Maternal Aunt 56  . Breast cancer Sister 5  . Breast cancer Maternal Aunt 54    Review of  Systems:  Constitutional  Feels well,   ENT Normal appearing ears and nares bilaterally Skin/Breast  No rash, sores, jaundice, itching, dryness Cardiovascular  No chest pain, shortness of breath, or edema  Pulmonary  No cough or wheeze.  Gastro Intestinal  No nausea, vomitting, or diarrhoea. No bright red blood per rectum, no abdominal pain, change in bowel movement, or constipation.  Genito Urinary  No frequency, urgency, dysuria, Musculo Skeletal  No myalgia, arthralgia, joint swelling or pain  Neurologic  No weakness, numbness, change in gait,  Psychology  No depression, anxiety, insomnia.   Vitals:  Blood pressure 103/63, pulse 79, temperature 98 F (36.7 C), temperature source Oral, resp. rate 18, height 5' 7.5" (1.715 m), weight 185 lb 6.4 oz (84.097 kg), last menstrual period 08/14/2014, SpO2 100 %.  Physical Exam: WD in NAD Neck  Supple NROM, without any enlargements.  Lymph Node Survey No cervical supraclavicular or inguinal adenopathy Cardiovascular  Pulse normal rate, regularity and rhythm. S1 and S2 normal.  Lungs  Clear to auscultation bilateraly, without wheezes/crackles/rhonchi. Good air movement.  Skin  No rash/lesions/breakdown  Psychiatry  Alert and oriented to person, place, and time  Abdomen  Normoactive bowel sounds, abdomen soft, non-tender and non obese without evidence of hernia. TRAM incisions well healed Back No CVA tenderness Genito Urinary  Vulva/vagina: Normal external female genitalia.  No lesions. No discharge or bleeding.  Bladder/urethra:  No lesions or masses, well supported bladder  Vagina: normal  Cervix: Normal appearing, no lesions.  Uterus: Small, mobile, no parametrial involvement or nodularity.  Adnexa: no palpable masses. Rectal  Good tone, no masses no cul de sac nodularity.  Extremities  No bilateral cyanosis, clubbing or edema.   Donaciano Eva, MD   01/06/2015, 4:03 PM

## 2015-01-09 ENCOUNTER — Telehealth: Payer: Self-pay | Admitting: Family Medicine

## 2015-01-09 LAB — FOLLICLE STIMULATING HORMONE: FSH: 28.2 m[IU]/mL

## 2015-01-09 LAB — ESTRADIOL, FREE
Estradiol, Serum, MS: 77 pg/mL
FREE ESTRADIOL, PERCENT: 1.3 %
Free Estradiol, Serum: 1 pg/mL

## 2015-01-09 NOTE — Telephone Encounter (Signed)
Called pt to let her know that she had been accepted into practice and to get an apt set up with Dr Buelah Manis.

## 2015-01-10 ENCOUNTER — Encounter: Payer: Self-pay | Admitting: *Deleted

## 2015-01-15 ENCOUNTER — Encounter (HOSPITAL_COMMUNITY): Payer: Self-pay | Admitting: Hematology & Oncology

## 2015-01-17 ENCOUNTER — Encounter: Payer: Self-pay | Admitting: Family Medicine

## 2015-01-17 ENCOUNTER — Ambulatory Visit (INDEPENDENT_AMBULATORY_CARE_PROVIDER_SITE_OTHER): Payer: 59 | Admitting: Family Medicine

## 2015-01-17 VITALS — BP 122/68 | HR 70 | Temp 98.8°F | Resp 14 | Ht 72.0 in | Wt 187.0 lb

## 2015-01-17 DIAGNOSIS — Z853 Personal history of malignant neoplasm of breast: Secondary | ICD-10-CM

## 2015-01-17 DIAGNOSIS — T798XXA Other early complications of trauma, initial encounter: Secondary | ICD-10-CM

## 2015-01-17 DIAGNOSIS — E559 Vitamin D deficiency, unspecified: Secondary | ICD-10-CM

## 2015-01-17 DIAGNOSIS — Z23 Encounter for immunization: Secondary | ICD-10-CM | POA: Diagnosis not present

## 2015-01-17 DIAGNOSIS — Z13820 Encounter for screening for osteoporosis: Secondary | ICD-10-CM | POA: Diagnosis not present

## 2015-01-17 DIAGNOSIS — L089 Local infection of the skin and subcutaneous tissue, unspecified: Secondary | ICD-10-CM | POA: Insufficient documentation

## 2015-01-17 DIAGNOSIS — F4321 Adjustment disorder with depressed mood: Secondary | ICD-10-CM

## 2015-01-17 DIAGNOSIS — Z0000 Encounter for general adult medical examination without abnormal findings: Secondary | ICD-10-CM

## 2015-01-17 DIAGNOSIS — T148XXA Other injury of unspecified body region, initial encounter: Secondary | ICD-10-CM

## 2015-01-17 MED ORDER — SULFAMETHOXAZOLE-TRIMETHOPRIM 800-160 MG PO TABS
1.0000 | ORAL_TABLET | Freq: Two times a day (BID) | ORAL | Status: DC
Start: 2015-01-17 — End: 2015-02-14

## 2015-01-17 NOTE — Patient Instructions (Addendum)
Discuss Paxil or Brisdelle for your mood will also help with hot flashes TDAP  Return for fasting labs  Take antibiotic as prescribed Bactrim twice a day 7 days  F/U 4 weeks

## 2015-01-17 NOTE — Assessment & Plan Note (Signed)
Discussed possible use of Paxil or brisdelle especially after surgical menopause, also discussed getting set up with support groups. I will reach out to her oncologist, I am sure they have some groups established she can join in the local area No SI. I will f/u with her after surgery

## 2015-01-17 NOTE — Assessment & Plan Note (Signed)
Nipple does not appear to be taking as a graft. Will start her on Bactrim due to gross pus seen, she will call her surgeon in [place] today for evaluation as this has been draining for a few weeks

## 2015-01-17 NOTE — Progress Notes (Signed)
Patient ID: Lydia Stevens, female   DOB: Dec 10, 1970, 44 y.o.   MRN: [id number]   Subjective:    Patient ID: Lydia Stevens, female    DOB: 09-13-1970, 44 y.o.   MRN: [id number]  Patient presents for New Patient- Establish Care  patient here for new patient physical. Her previous primary care provider with the health department however she only went there for Depo-Provera injections. She was also seen there for GYN visits but has had a GYN that she establish with recently a family tree. She is diagnosed with right-sided breast cancer back in October 2015 she underwent mastectomy with reconstruction but had some poor wound healing from that. She now has some drainage around the nipple that was recently reconstructed. She did 3 months worth of chemotherapy and is currently on tamoxifen and she is due to have bilateral oophorectomy and tubal removal this Friday at [facility] by Dr. Rogelio Seen.   she does have significant family history of breast cancer. She did have a complete GYN exam done this month as well with a negative Pap smear.   She has history of vitamin D deficiency she is currently on vitamin supplement she did have a bone density done about 10 years ago it was normal she was on Depo-Provera for 19 years. She is due for repeat bone density.   She is overdue for fasting labs including screening lipid panel.    stress and anxiety she has had a whirlwind of things over the past 6-8 months with her diagnosis of cancer. She's been trying to stay strong but has nights where she just cries due to the physical as well as emotional impairment from the cancer. She is not in any support group has never taken any medication for it. Things that she brings up for her appearance to her husband as she has lost all for hair she has wounds that have not healed from surgeries and she is in pain all the time. She works as a Librarian, academic with [employer] and is concerned about returning to work because  she has a fogginess about her since she had chemotherapy. She has many emotions running through her head. She denies any suicidal ideations or hallucinations. She does have problems with insomnia. She's never truly brought any of this up to her oncologist.   Dr. Iran Planas- Reconstructive surgeon Dr. Everitt Amber- medical GYN  Oncologist Dr. Ancil Linsey- Oncology- [facility]  Review Of Systems:  GEN- denies fatigue, fever, weight loss,weakness, recent illness HEENT- denies eye drainage, change in vision, nasal discharge, CVS- denies chest pain, palpitations RESP- denies SOB, cough, wheeze ABD- denies N/V, change in stools, abd pain GU- denies dysuria, hematuria, dribbling, incontinence MSK- denies joint pain, muscle aches, injury Neuro- denies headache, dizziness, syncope, seizure activity       Objective:    BP 122/68 mmHg  Pulse 70  Temp(Src) 98.8 F (37.1 C) (Oral)  Resp 14  Ht 6' (1.829 m)  Wt 187 lb (84.823 kg)  BMI 25.36 kg/m2  LMP 08/14/2014 GEN- NAD, alert and oriented x3 HEENT- PERRL, EOMI, non injected sclera, pink conjunctiva, MMM, oropharynx clear Neck- Supple, no thyromegaly Breast- surgical scars bilat breast healed, right nipple graft hyperpigmented with mild eschar at edge, yellow drainage noted, not adhered to breast wall, drainage on bra, mild erythema surrounding, NT  CVS- RRR, no murmur RESP-CTAB ABD-NABS,soft,NT,ND Psych- tearful, not depressed appearing or anxious, no SI, good eye contact EXT- No edema Pulses- Radial, DP- 2+  Assessment & Plan:      Problem List Items Addressed This Visit    None    Visit Diagnoses    Routine general medical examination at a health care facility    -  Primary    CPE done TDAP done, plan for Bone Density in 3 months. Return for fasting labs    Relevant Orders    Tdap vaccine greater than or equal to 7yo IM (Completed)    Need for prophylactic vaccination with combined diphtheria-tetanus-pertussis (DTP) vaccine         Relevant Orders    Tdap vaccine greater than or equal to 7yo IM (Completed)       Note: This dictation was prepared with Dragon dictation along with smaller phrase technology. Any transcriptional errors that result from this process are unintentional.

## 2015-01-19 ENCOUNTER — Other Ambulatory Visit: Payer: 59

## 2015-01-19 DIAGNOSIS — Z0000 Encounter for general adult medical examination without abnormal findings: Secondary | ICD-10-CM

## 2015-01-19 DIAGNOSIS — E559 Vitamin D deficiency, unspecified: Secondary | ICD-10-CM

## 2015-01-19 LAB — LIPID PANEL
Cholesterol: 186 mg/dL (ref 0–200)
HDL: 41 mg/dL — ABNORMAL LOW (ref >=46)
LDL Cholesterol: 126 mg/dL — ABNORMAL HIGH (ref 0–99)
TRIGLYCERIDES: 95 mg/dL (ref <150)
Total CHOL/HDL Ratio: 4.5 Ratio
VLDL: 19 mg/dL (ref 0–40)

## 2015-01-19 LAB — CBC WITH DIFFERENTIAL/PLATELET
Basophils Absolute: 0 10*3/uL (ref 0.0–0.1)
Basophils Relative: 0 % (ref 0–1)
EOS ABS: 0.1 10*3/uL (ref 0.0–0.7)
Eosinophils Relative: 1 % (ref 0–5)
HCT: 35.5 % — ABNORMAL LOW (ref 36.0–46.0)
Hemoglobin: 11.3 g/dL — ABNORMAL LOW (ref 12.0–15.0)
Lymphocytes Relative: 20 % (ref 12–46)
Lymphs Abs: 1.5 10*3/uL (ref 0.7–4.0)
MCH: 27.6 pg (ref 26.0–34.0)
MCHC: 31.8 g/dL (ref 30.0–36.0)
MCV: 86.6 fL (ref 78.0–100.0)
MPV: 9.6 fL (ref 8.6–12.4)
Monocytes Absolute: 0.4 10*3/uL (ref 0.1–1.0)
Monocytes Relative: 6 % (ref 3–12)
NEUTROS PCT: 73 % (ref 43–77)
Neutro Abs: 5.4 10*3/uL (ref 1.7–7.7)
PLATELETS: 248 10*3/uL (ref 150–400)
RBC: 4.1 MIL/uL (ref 3.87–5.11)
RDW: 14.8 % (ref 11.5–15.5)
WBC: 7.4 10*3/uL (ref 4.0–10.5)

## 2015-01-19 LAB — COMPREHENSIVE METABOLIC PANEL
ALBUMIN: 3.6 g/dL (ref 3.5–5.2)
ALT: 10 U/L (ref 0–35)
AST: 13 U/L (ref 0–37)
Alkaline Phosphatase: 55 U/L (ref 39–117)
BUN: 12 mg/dL (ref 6–23)
CALCIUM: 9.2 mg/dL (ref 8.4–10.5)
CO2: 27 mEq/L (ref 19–32)
Chloride: 107 mEq/L (ref 96–112)
Creat: 0.76 mg/dL (ref 0.50–1.10)
Glucose, Bld: 88 mg/dL (ref 70–99)
POTASSIUM: 4 meq/L (ref 3.5–5.3)
Sodium: 144 mEq/L (ref 135–145)
Total Bilirubin: 0.4 mg/dL (ref 0.2–1.2)
Total Protein: 6.3 g/dL (ref 6.0–8.3)

## 2015-01-19 LAB — TSH: TSH: 1.486 u[IU]/mL (ref 0.350–4.500)

## 2015-01-20 LAB — VITAMIN D 25 HYDROXY (VIT D DEFICIENCY, FRACTURES): Vit D, 25-Hydroxy: 34 ng/mL (ref 30–100)

## 2015-01-31 ENCOUNTER — Encounter (HOSPITAL_COMMUNITY): Payer: 59

## 2015-02-01 ENCOUNTER — Encounter: Payer: Self-pay | Admitting: *Deleted

## 2015-02-14 ENCOUNTER — Ambulatory Visit (INDEPENDENT_AMBULATORY_CARE_PROVIDER_SITE_OTHER): Payer: 59 | Admitting: Family Medicine

## 2015-02-14 ENCOUNTER — Telehealth: Payer: Self-pay

## 2015-02-14 ENCOUNTER — Encounter: Payer: Self-pay | Admitting: Family Medicine

## 2015-02-14 VITALS — BP 112/74 | HR 76 | Temp 98.8°F | Resp 14 | Ht 72.0 in | Wt 177.0 lb

## 2015-02-14 DIAGNOSIS — F4321 Adjustment disorder with depressed mood: Secondary | ICD-10-CM | POA: Diagnosis not present

## 2015-02-14 DIAGNOSIS — C50311 Malignant neoplasm of lower-inner quadrant of right female breast: Secondary | ICD-10-CM | POA: Diagnosis not present

## 2015-02-14 MED ORDER — VENLAFAXINE HCL ER 37.5 MG PO CP24: 37.5000 mg | ORAL_CAPSULE | Freq: Every day | ORAL | Status: DC

## 2015-02-14 NOTE — Patient Instructions (Signed)
Start Effexor once a day- i will call and check on you in a few weeks Dr. Whitney Muse will discuss Mammogram  Continue other medications F/U 2 months

## 2015-02-14 NOTE — Assessment & Plan Note (Signed)
Start effexor XL daily, will f/u via phone in a few weeks to adjust meds F/U in office 2 months Support group at the cancer center

## 2015-02-14 NOTE — Progress Notes (Signed)
Patient ID: Lydia Stevens, female   DOB: [birth year], 44 y.o.   MRN: [id number]   Subjective:    Patient ID: Lydia Stevens, female    DOB: [birth year], 44 y.o.   MRN: [id number]  Patient presents for 4 week F/U and Surgical F/U  patient here for interim follow-up. She is status post 2 for reck me because of her breast cancer. In general she is doing well. She did have some problems with healing from some of her incisions she has follow-up with her gynecology/oncologist this week. She also has follow-up with her breast reconstructive surgeon tomorrow after her last visit she completed antibiotics, then  they went in and trim some of the nonhealing tissue from the bright breast this is now healed. He continues to struggle with her depression and anxiety movements. She states she will be fine until someone asked her about her surgery and her breast cancer then she is very tearful and feels like she is losing it. She is thinking about joining a support group in [place] since this is after her work hours. She is open to starting medication to help with her mood.    Review Of Systems:  GEN- denies fatigue, fever, weight loss,weakness, recent illness HEENT- denies eye drainage, change in vision, nasal discharge, CVS- denies chest pain, palpitations RESP- denies SOB, cough, wheeze ABD- denies N/V, change in stools, abd pain GU- denies dysuria, hematuria, dribbling, incontinence MSK- denies joint pain, muscle aches, injury Neuro- denies headache, dizziness, syncope, seizure activity       Objective:    BP 112/74 mmHg  Pulse 76  Temp(Src) 98.8 F (37.1 C) (Oral)  Resp 14  Ht 6' (1.829 m)  Wt 177 lb (80.287 kg)  BMI 24.00 kg/m2 GEN- NAD, alert and oriented x3 Psych- very pleasant, tearful at times, not overly anxious, no SI, well groomed        Assessment & Plan:      Problem List Items Addressed This Visit    Situational depression - Primary   Relevant Medications   venlafaxine XR (EFFEXOR XR) 37.5 MG 24 hr capsule   Breast cancer of lower-inner quadrant of right female breast      Note: This dictation was prepared with Dragon dictation along with smaller phrase technology. Any transcriptional errors that result from this process are unintentional.

## 2015-02-14 NOTE — Telephone Encounter (Signed)
Order for bras faxed to [facility].  Sent to scan.  

## 2015-02-14 NOTE — Assessment & Plan Note (Signed)
F/U with oncology to see when mammogram needed Note with recent surgeries and healing will give her handicap placard for next 6 months

## 2015-02-16 ENCOUNTER — Encounter: Payer: Self-pay | Admitting: Gynecologic Oncology

## 2015-02-16 ENCOUNTER — Ambulatory Visit: Payer: 59 | Attending: Gynecologic Oncology | Admitting: Gynecologic Oncology

## 2015-02-16 VITALS — BP 132/69 | HR 75 | Temp 98.9°F | Resp 20 | Ht 72.0 in | Wt 177.7 lb

## 2015-02-16 DIAGNOSIS — C50919 Malignant neoplasm of unspecified site of unspecified female breast: Secondary | ICD-10-CM | POA: Insufficient documentation

## 2015-02-16 DIAGNOSIS — T814XXA Infection following a procedure, initial encounter: Secondary | ICD-10-CM | POA: Diagnosis not present

## 2015-02-16 NOTE — Patient Instructions (Signed)
Begin taking Augmentin as prescribed.  If your incision has not improved by Sunday evening, please call first thing on Monday so we can make you an appointment to see Dr. Denman George.  Please call for any questions or concerns.

## 2015-02-16 NOTE — Progress Notes (Signed)
POSTOPERATIVE VISIT Assessment:    44 y.o. year old with breast cancer, hormone receptor positive.   S/p laparoscopic BSO on 02/16/15.   Plan: 1) Pathology reports reviewed today 2) Treatment counseling - no malignancy identified.  She appears to have a wound infection in the suprapubic site. Recommend starting Augmentin BID. If not clinically better in 72 hours, recommend returning to clinic for incision and drainage. She was given the opportunity to ask questions, which were answered to her satisfaction, and she is agreement with the above mentioned plan of care.  3)  Return to clinic prn or in 72 hours if incision not improved.  HPI:  Lydia Stevens is a 44 y.o. year old G1P1 initially seen in consultation on 01/06/15 for a history of breast cancer, hormone receptor positive, needing Arimidex therapy and needing surgical menopause induction.  She then underwent a laparoscopic BSO on 01/26/15 with Dr Alycia Rossetti at [facility] without complications.  Her postoperative course was uncomplicated.  Her final pathology revealed benign ovaries and tubes.  She is seen today for a postoperative check and to discuss her pathology results and ongoing plan.  Since discharge from the hospital, she is feeling well.  She has improving appetite, normal bowel and bladder function, and pain controlled with minimal PO medication. She has tenderness in the suprapubic incision and was prescribed antibiotics by her breast cancer surgeon yesterday for this    Review of systems: Constitutional:  She has no weight gain or weight loss. She has no fever or chills. Eyes: No blurred vision Ears, Nose, Mouth, Throat: No dizziness, headaches or changes in hearing. No mouth sores. Cardiovascular: No chest pain, palpitations or edema. Respiratory:  No shortness of breath, wheezing or cough Gastrointestinal: She has normal bowel movements without diarrhea or constipation. She denies any nausea or vomiting. She denies blood in her  stool or heart burn. Genitourinary:  She denies pelvic pain, pelvic pressure or changes in her urinary function. She has no hematuria, dysuria, or incontinence. She has no irregular vaginal bleeding or vaginal discharge Musculoskeletal: Denies muscle weakness or joint pains.  Skin:  She has no skin changes, rashes or itching Neurological:  Denies dizziness or headaches. No neuropathy, no numbness or tingling. Psychiatric:  She denies depression or anxiety. Hematologic/Lymphatic:   No easy bruising or bleeding   Physical Exam: Blood pressure 132/69, pulse 75, temperature 98.9 F (37.2 C), temperature source Oral, resp. rate 20, height 6' (1.829 m), weight 177 lb 11.2 oz (80.604 kg), SpO2 100 %. General: Well dressed, well nourished in no apparent distress.   Abdomen:  Soft, nontender, nondistended.  No palpable masses.  No hepatosplenomegaly.  No ascites. Normal bowel sounds.  No hernias.  Incisions are healing well but suprapubic incision has some fluctuance underlying the incision and surrounding tenderness and erythema.  Donaciano Eva, MD

## 2015-02-20 ENCOUNTER — Ambulatory Visit: Payer: 59 | Attending: Gynecologic Oncology | Admitting: Gynecologic Oncology

## 2015-02-20 ENCOUNTER — Encounter: Payer: Self-pay | Admitting: Gynecologic Oncology

## 2015-02-20 VITALS — BP 140/78 | HR 20 | Temp 98.3°F | Resp 20 | Ht 72.0 in | Wt 179.5 lb

## 2015-02-20 DIAGNOSIS — T814XXA Infection following a procedure, initial encounter: Secondary | ICD-10-CM | POA: Diagnosis not present

## 2015-02-20 DIAGNOSIS — C50911 Malignant neoplasm of unspecified site of right female breast: Secondary | ICD-10-CM | POA: Diagnosis not present

## 2015-02-20 NOTE — Progress Notes (Signed)
POSTOPERATIVE VISIT Assessment:    44 y.o. year old with breast cancer, hormone receptor positive.   S/p laparoscopic BSO on 02/16/15. Suprapubic wound infection s/p I&D.  Plan: BID wet to dry dressings. Return for evaluation in 4 weeks.  HPI:  Lydia Stevens is a 44 y.o. year old G1P1 initially seen in consultation on 01/06/15 for a history of breast cancer, hormone receptor positive, needing Arimidex therapy and needing surgical menopause induction.  She then underwent a laparoscopic BSO on 01/26/15 with Dr Alycia Rossetti at [facility] without complications.  Her postoperative course was uncomplicated.  Her final pathology revealed benign ovaries and tubes.  She is seen today for evaluation of her suprapubic wound.  She has tenderness in the suprapubic incision and was prescribed antibiotics 1 week ago, however has had minimal relief. The area is very tender to touch. She has no drainage. SHe has no fever. The incision is in the same site as her prior abdominoplasty.   Review of systems: Constitutional:  She has no weight gain or weight loss. She has no fever or chills. Eyes: No blurred vision Ears, Nose, Mouth, Throat: No dizziness, headaches or changes in hearing. No mouth sores. Cardiovascular: No chest pain, palpitations or edema. Respiratory:  No shortness of breath, wheezing or cough Gastrointestinal: She has normal bowel movements without diarrhea or constipation. She denies any nausea or vomiting. She denies blood in her stool or heart burn. Genitourinary:  She denies pelvic pain, pelvic pressure or changes in her urinary function. She has no hematuria, dysuria, or incontinence. She has no irregular vaginal bleeding or vaginal discharge Musculoskeletal: Denies muscle weakness or joint pains.  Skin:  She has no skin changes, rashes or itching Neurological:  Denies dizziness or headaches. No neuropathy, no numbness or tingling. Psychiatric:  She denies depression or anxiety. Hematologic/Lymphatic:    No easy bruising or bleeding   Physical Exam: Blood pressure 140/78, pulse 20, temperature 98.3 F (36.8 C), temperature source Oral, resp. rate 20, height 6' (1.829 m), weight 179 lb 8 oz (81.421 kg), SpO2 100 %. General: Well dressed, well nourished in no apparent distress.   Abdomen:  Soft, nontender, nondistended.  No palpable masses.  No hepatosplenomegaly.  No ascites. Normal bowel sounds.  No hernias.  Incisions are healing well. Suprapubic incision has some fluctuance underlying the incision and surrounding tenderness and erythema.  Procedure Note: INCISION AND DRAINAGE Verbal consent was obtained. The skin was prepped with Betadine. Skin overlying the area of fluctuance was infiltrated with 1% plain lidocaine. A 1 inch incision was made overlying your fluctuance in the incision. A small amount of clear fluid was expressed. Hemostasis was achieved at the base of the incision with silver nitrate. Probing with a Q-tip failed to reveal a tract or any purulence. The wound was packed with iodoform loss tape and the patient was instructed on how to perform twice a day packings.  Donaciano Eva, MD

## 2015-02-20 NOTE — Patient Instructions (Signed)
Pack the opening in your incision twice daily with iodoform packing strip.  You may take a shower and cleanse the incision then repack it.  Plan to follow up in three weeks or sooner if the issues arise, incision not healing, etc.

## 2015-02-24 ENCOUNTER — Telehealth: Payer: Self-pay

## 2015-02-24 NOTE — Telephone Encounter (Signed)
Order faxed to [facility].  Sent to scan. 

## 2015-03-09 ENCOUNTER — Encounter: Payer: Self-pay | Admitting: Gynecologic Oncology

## 2015-03-09 ENCOUNTER — Ambulatory Visit: Payer: 59 | Attending: Gynecologic Oncology | Admitting: Gynecologic Oncology

## 2015-03-09 VITALS — BP 125/63 | HR 83 | Temp 97.7°F | Resp 20 | Ht 72.0 in | Wt 179.2 lb

## 2015-03-09 DIAGNOSIS — C50911 Malignant neoplasm of unspecified site of right female breast: Secondary | ICD-10-CM | POA: Diagnosis not present

## 2015-03-09 DIAGNOSIS — T8131XD Disruption of external operation (surgical) wound, not elsewhere classified, subsequent encounter: Secondary | ICD-10-CM | POA: Insufficient documentation

## 2015-03-09 NOTE — Patient Instructions (Signed)
Please call us in the future with any questions or concerns you have!

## 2015-03-09 NOTE — Progress Notes (Signed)
POSTOPERATIVE VISIT Assessment:    44 y.o. year old with breast cancer, hormone receptor positive.   S/p laparoscopic BSO on 02/16/15. Suprapubic wound infection s/p I&D now healed.  Plan: No further therapy needed. Followup on prn basis only.  HPI:  Lydia Stevens is a 44 y.o. year old G1P1 initially seen in consultation on 01/06/15 for a history of breast cancer, hormone receptor positive, needing Arimidex therapy and needing surgical menopause induction.  She then underwent a laparoscopic BSO on 01/26/15 with Dr Alycia Rossetti at [facility] without complications.  Her postoperative course was uncomplicated.  Her final pathology revealed benign ovaries and tubes.  She is seen today for evaluation of her suprapubic wound.  It was I&D's 2 weeks ago, she has been using wet to dry dressings. The wound is now completely pain free and no longer requires dressings.  Review of systems: Constitutional:  She has no weight gain or weight loss. She has no fever or chills. Eyes: No blurred vision Ears, Nose, Mouth, Throat: No dizziness, headaches or changes in hearing. No mouth sores. Cardiovascular: No chest pain, palpitations or edema. Respiratory:  No shortness of breath, wheezing or cough Gastrointestinal: She has normal bowel movements without diarrhea or constipation. She denies any nausea or vomiting. She denies blood in her stool or heart burn. Genitourinary:  She denies pelvic pain, pelvic pressure or changes in her urinary function. She has no hematuria, dysuria, or incontinence. She has no irregular vaginal bleeding or vaginal discharge Musculoskeletal: Denies muscle weakness or joint pains.  Skin:  She has no skin changes, rashes or itching Neurological:  Denies dizziness or headaches. No neuropathy, no numbness or tingling. Psychiatric:  She denies depression or anxiety. Hematologic/Lymphatic:   No easy bruising or bleeding   Physical Exam: Blood pressure 125/63, pulse 83, temperature 97.7 F (36.5  C), temperature source Oral, resp. rate 20, height 6' (1.829 m), weight 179 lb 3.2 oz (81.285 kg), SpO2 100 %. General: Well dressed, well nourished in no apparent distress.   Abdomen:  Soft, nontender, nondistended.  No palpable masses.  No hepatosplenomegaly.  No ascites. Normal bowel sounds.  No hernias.  Incisions are healing well. Suprapubic incision has no fluctuance/distension, drainage. Edges have healed over.   Donaciano Eva, MD

## 2015-03-14 ENCOUNTER — Encounter (HOSPITAL_COMMUNITY): Payer: 59

## 2015-03-17 ENCOUNTER — Ambulatory Visit: Payer: 59 | Admitting: Gynecologic Oncology

## 2015-03-24 ENCOUNTER — Other Ambulatory Visit (HOSPITAL_COMMUNITY): Payer: Self-pay | Admitting: Oncology

## 2015-03-24 ENCOUNTER — Encounter (HOSPITAL_COMMUNITY): Payer: Self-pay | Admitting: Hematology & Oncology

## 2015-03-24 ENCOUNTER — Encounter (HOSPITAL_COMMUNITY): Payer: 59 | Attending: Hematology & Oncology

## 2015-03-24 ENCOUNTER — Encounter (HOSPITAL_COMMUNITY): Payer: 59 | Attending: Hematology & Oncology | Admitting: Hematology & Oncology

## 2015-03-24 VITALS — BP 101/71 | HR 77 | Temp 98.0°F | Resp 20 | Wt 183.0 lb

## 2015-03-24 DIAGNOSIS — C50311 Malignant neoplasm of lower-inner quadrant of right female breast: Secondary | ICD-10-CM | POA: Diagnosis present

## 2015-03-24 DIAGNOSIS — Z139 Encounter for screening, unspecified: Secondary | ICD-10-CM

## 2015-03-24 DIAGNOSIS — E559 Vitamin D deficiency, unspecified: Secondary | ICD-10-CM | POA: Diagnosis not present

## 2015-03-24 DIAGNOSIS — Z170 Estrogen receptor positive status [ER+]: Secondary | ICD-10-CM

## 2015-03-24 DIAGNOSIS — R110 Nausea: Secondary | ICD-10-CM | POA: Diagnosis not present

## 2015-03-24 LAB — CBC WITH DIFFERENTIAL/PLATELET
BASOS ABS: 0 10*3/uL (ref 0.0–0.1)
Basophils Relative: 0 % (ref 0–1)
EOS ABS: 0.1 10*3/uL (ref 0.0–0.7)
Eosinophils Relative: 1 % (ref 0–5)
HCT: 36 % (ref 36.0–46.0)
Hemoglobin: 12 g/dL (ref 12.0–15.0)
LYMPHS ABS: 1.8 10*3/uL (ref 0.7–4.0)
LYMPHS PCT: 23 % (ref 12–46)
MCH: 26.7 pg (ref 26.0–34.0)
MCHC: 33.3 g/dL (ref 30.0–36.0)
MCV: 80 fL (ref 78.0–100.0)
MONO ABS: 0.3 10*3/uL (ref 0.1–1.0)
Monocytes Relative: 4 % (ref 3–12)
NEUTROS PCT: 72 % (ref 43–77)
Neutro Abs: 5.6 10*3/uL (ref 1.7–7.7)
Platelets: 240 10*3/uL (ref 150–400)
RBC: 4.5 MIL/uL (ref 3.87–5.11)
RDW: 16.1 % — ABNORMAL HIGH (ref 11.5–15.5)
WBC: 7.7 10*3/uL (ref 4.0–10.5)

## 2015-03-24 LAB — COMPREHENSIVE METABOLIC PANEL
ALBUMIN: 3.8 g/dL (ref 3.5–5.0)
ALK PHOS: 67 U/L (ref 38–126)
ALT: 14 U/L (ref 14–54)
AST: 16 U/L (ref 15–41)
Anion gap: 7 (ref 5–15)
BUN: 12 mg/dL (ref 6–20)
CHLORIDE: 105 mmol/L (ref 101–111)
CO2: 28 mmol/L (ref 22–32)
CREATININE: 0.85 mg/dL (ref 0.44–1.00)
Calcium: 9.2 mg/dL (ref 8.9–10.3)
GFR calc Af Amer: >60 mL/min (ref >60)
GFR calc non Af Amer: >60 mL/min (ref >60)
GLUCOSE: 91 mg/dL (ref 65–99)
POTASSIUM: 4.1 mmol/L (ref 3.5–5.1)
SODIUM: 140 mmol/L (ref 135–145)
TOTAL PROTEIN: 7.3 g/dL (ref 6.5–8.1)
Total Bilirubin: 0.5 mg/dL (ref 0.3–1.2)

## 2015-03-24 MED ORDER — TAMOXIFEN CITRATE 20 MG PO TABS: 20.0000 mg | ORAL_TABLET | Freq: Every day | ORAL | Status: DC

## 2015-03-24 NOTE — Progress Notes (Signed)
Lydia Blackbird, MD [address] [place] [address]  Right breast invasive ductal carcinoma (with associated DCIS) status post mastectomy 07/27/2014 followed by reconstruction with TRAM flap  2 cm tumor with intermediate grade DCIS, lymphovascular invasion noted, 2 SLN negative, grade 3, T1 C. N0 M0 stage IA  ER+ (100%), PR+ (71%), Her-2 negative  Core needle biopsy of L breast lesion c/w fibroadenoma  Oncotype DX score 24, 16% risk of recurrence    Breast cancer of lower-inner quadrant of right female breast   06/07/2014 Initial Biopsy Right breast needle biopsy 5:00 position: Invasive ductal carcinoma with DCIS, ER 100%, PR 71%, Ki-67 33%, HER-2 negative ratio 1.03   06/17/2014 Breast MRI Right breast: 10 x 7 x 5 mm biopsy-proven IDC with DCIS, left breast 7 x 7 x 7 mm fibroadenoma, left upper quadrant of the abdomen abutting the peritoneum 3 x 1.1 cm oval soft tissue mass   07/27/2014 Surgery Right breast mastectomy: Invasive ductal carcinoma grade 3, 2 cm, intermediate grade DCIS, lymphovascular invasion identified, 2 SLN negative, T1 C. N0 M0 stage IA, ER positive, PR 7%, HER-2 negative, Ki-67 33%   07/27/2014 Oncotype testing Recurrence Score of 24, placing patient in the intermediate risk group   09/06/2014 -  Chemotherapy Taxotere/Cytoxan    09/08/2014 Adverse Reaction Nasuea and voming three times x 2 days.  Added Aloxi and Emend to anti-emetic regimen.   10/18/2014 -  Chemotherapy Zoladex   10/27/2014 Adverse Reaction Hives, secondary to Zoladex?   02/16/2015 Surgery Laparoscopic BSO with Dr. Alycia Rossetti     CURRENT THERAPY: Tamoxifen  INTERVAL HISTORY: Lydia Stevens comes in today for follow-up of a right breast cancer, ER positive, PR positive and HER-2 negative.   She is here alone today and in good spirits, smiling and laughing frequently. She describes her appetite as being too good. She has not noticed any new lumps or bumps. She recently had an infection after her  laparoscopic BSO. She mentions having a lot of scar tissue from her reconstruction and wondered if this was normal.  She is still going through breast reconstruction due to them being unsymmetrical.  She has experienced numb and cold feelings in her reconstructed breasts. She has also noticed hair growth on her breasts where there never was before.  She did not begin her antidepressant. She has felt better since returning to work, denying any of her previous sadness. Her only complaint is pain from her scoliosis. She inquired whether she still needed to have regular screening mammograms.  She has no other major complaints today. She is here to discuss AI therapy.  MEDICAL HISTORY: Past Medical History  Diagnosis Date  . Scoliosis   . Breast cancer     2015  . Anemia     before   . Hives Mar 3rd 2016  . GERD (gastroesophageal reflux disease)   . History of breast cancer 12/29/2014  . Blood transfusion without reported diagnosis   . Hyperlipidemia     has Breast cancer of lower-inner quadrant of right female breast; Genetic testing; Oral candidiasis; Hives; Acquired absence of breast and nipple; History of breast cancer; Situational depression; Wound infection; and Scoliosis of lumbar spine on her problem list.    Breast cancer risk profile:  She menarched at age of 89  She had one pregnancy, her first child was born at age 54  She is currently on Depo Provera shots for unknown time in the [facility] She was never  exposed to fertility medications or hormone replacement therapy.  She has family history of Breast/GYN/GI cancer; 2 half-sisters with breast cancer    is allergic to oxycodone.  Lydia Stevens does not currently have medications on file.  SURGICAL HISTORY: Past Surgical History  Procedure Laterality Date  . Cesarean section  1996  . Wisdom tooth extraction    . Mastectomy w/ sentinel node biopsy Right 07/27/2014    Procedure: RIGHT MASTECTOMY WITH RIGHT  AXILLARY SENTINEL LYMPH NODE BIOPSY;  Surgeon: Alphonsa Overall, MD;  Location: [place];  Service: General;  Laterality: Right;  . Latissimus flap to breast Right 07/27/2014    Procedure: TRAM FLAP RECONSTUCTION RIGHT CHEST;  Surgeon: Irene Limbo, MD;  Location: [place];  Service: Plastics;  Laterality: Right;  . Portacath placement Left 09/05/2013  . Breast reduction surgery Left 12/06/2014    Procedure: LEFT BREAST REDUCTION FOR ASYMMETRY;  Surgeon: Irene Limbo, MD;  Location: [place];  Service: Plastics;  Laterality: Left;  . Breast reconstruction Right 12/06/2014    Procedure: RIGHT NIPPLE AREOLAR RECONSTRUCTION WITH FULL THICKNESS SKIN GRAFT FROM RIGHT THIGH;  Surgeon: Irene Limbo, MD;  Location: [place];  Service: Plastics;  Laterality: Right;  . Port-a-cath removal Left 12/06/2014    Procedure: REMOVAL PORT-A-CATH;  Surgeon: Irene Limbo, MD;  Location: [place];  Service: Plastics;  Laterality: Left;  . Oophorectomy  2016    SOCIAL HISTORY: Social History   Social History  . Marital Status: Married    Spouse Name: N/A  . Number of Children: 1  . Years of Education: N/A   Occupational History  . Not on file.   Social History Main Topics  . Smoking status: Never Smoker   . Smokeless tobacco: Never Used  . Alcohol Use: No  . Drug Use: No  . Sexual Activity: Yes    Birth Control/ Protection: None   Other Topics Concern  . Not on file   Social History Narrative    FAMILY HISTORY: Family History  Problem Relation Age of Onset  . Breast cancer Paternal Aunt 24  . Heart attack Father   . Arthritis Father   . Heart disease Father     Massive MI  . Breast cancer Maternal Aunt 48  . Breast cancer Sister 51  . Cancer Sister   . Breast cancer Maternal Aunt 9  . Arthritis Mother   . Hypertension Mother   . Asthma Sister   . Alcohol abuse Brother   . Hypertension Brother   . Cancer Sister     1/2 - breast  cancer    Review of Systems  Constitutional: Negative for fever, chills, weight loss and malaise/fatigue.  HENT: Negative for congestion, hearing loss, nosebleeds, sore throat and tinnitus.   Eyes: Negative for blurred vision, double vision, pain and discharge.  Respiratory: Negative for cough, hemoptysis, sputum production, shortness of breath and wheezing.   Cardiovascular: Negative for chest pain, palpitations, claudication, leg swelling and PND.  Gastrointestinal: Negative for heartburn, vomiting, abdominal pain, diarrhea, constipation, blood in stool and melena.  Genitourinary: Negative for dysuria, urgency, frequency and hematuria.  Musculoskeletal: Negative for myalgias, joint pain and falls.  Skin: Negative for itching and rash.  Neurological: Negative for dizziness, tingling, tremors, sensory change, speech change, focal weakness, seizures, loss of consciousness, weakness and headaches.  Endo/Heme/Allergies: Does not bruise/bleed easily.  Psychiatric/Behavioral: Negative for depression, suicidal ideas, memory loss and substance abuse. The patient is not nervous/anxious and does not  have insomnia.   14 point review of systems was performed and is negative except as detailed under history of present illness and above   PHYSICAL EXAMINATION  ECOG PERFORMANCE STATUS: 0 - Asymptomatic  Filed Vitals:   03/24/15 1000  BP: 101/71  Pulse: 77  Temp: 98 F (36.7 C)  Resp: 20    Physical Exam  Constitutional: She is oriented to person, place, and time and well-developed, well-nourished, and in no distress.  HENT:  Head: Normocephalic and atraumatic.  Nose: Nose normal.  Mouth/Throat: Oropharynx is clear and moist. No oropharyngeal exudate.  Eyes: Conjunctivae and EOM are normal. Pupils are equal, round, and reactive to light. Right eye exhibits no discharge. Left eye exhibits no discharge. No scleral icterus.  Neck: Normal range of motion. Neck supple. No tracheal deviation  present. No thyromegaly present.  Cardiovascular: Normal rate, regular rhythm and normal heart sounds.  Exam reveals no gallop and no friction rub.   No murmur heard. Pulmonary/Chest: Effort normal and breath sounds normal. She has no wheezes. She has no rales.  Abdominal: Soft. Bowel sounds are normal. She exhibits no distension and no mass. There is no tenderness. There is no rebound and no guarding. Well healed surgical incision sites. Musculoskeletal: Normal range of motion. She exhibits no edema.  Lymphadenopathy:    She has no cervical adenopathy.  Neurological: She is alert and oriented to person, place, and time. She has normal reflexes. No cranial nerve deficit. Gait normal. Coordination normal.  Skin: Skin is warm and dry. No rash noted.  Psychiatric: Mood, memory, affect and judgment normal.  Nursing note and vitals reviewed.   LABORATORY DATA:  CBC    Component Value Date/Time   WBC 7.7 03/24/2015 1205   WBC 7.6 09/05/2014 0914   RBC 4.50 03/24/2015 1205   RBC 4.17 09/05/2014 0914   HGB 12.0 03/24/2015 1205   HGB 11.7 09/05/2014 0914   HCT 36.0 03/24/2015 1205   HCT 35.4 09/05/2014 0914   PLT 240 03/24/2015 1205   PLT 297 09/05/2014 0914   MCV 80.0 03/24/2015 1205   MCV 84.9 09/05/2014 0914   MCH 26.7 03/24/2015 1205   MCH 28.1 09/05/2014 0914   MCHC 33.3 03/24/2015 1205   MCHC 33.1 09/05/2014 0914   RDW 16.1* 03/24/2015 1205   RDW 14.9* 09/05/2014 0914   LYMPHSABS 1.8 03/24/2015 1205   LYMPHSABS 1.6 09/05/2014 0914   MONOABS 0.3 03/24/2015 1205   MONOABS 0.5 09/05/2014 0914   EOSABS 0.1 03/24/2015 1205   EOSABS 0.2 09/05/2014 0914   BASOSABS 0.0 03/24/2015 1205   BASOSABS 0.0 09/05/2014 0914   CMP     Component Value Date/Time   NA 140 03/24/2015 1205   NA 139 09/05/2014 0914   K 4.1 03/24/2015 1205   K 3.8 09/05/2014 0914   CL 105 03/24/2015 1205   CO2 28 03/24/2015 1205   CO2 29 09/05/2014 0914   GLUCOSE 91 03/24/2015 1205   GLUCOSE 88  09/05/2014 0914   BUN 12 03/24/2015 1205   BUN 6.0* 09/05/2014 0914   CREATININE 0.85 03/24/2015 1205   CREATININE 0.76 01/19/2015 0910   CREATININE 0.8 09/05/2014 0914   CALCIUM 9.2 03/24/2015 1205   CALCIUM 9.3 09/05/2014 0914   PROT 7.3 03/24/2015 1205   PROT 6.6 09/05/2014 0914   ALBUMIN 3.8 03/24/2015 1205   ALBUMIN 3.4* 09/05/2014 0914   AST 16 03/24/2015 1205   AST 16 09/05/2014 0914   ALT 14 03/24/2015 1205   ALT  9 09/05/2014 0914   ALKPHOS 67 03/24/2015 1205   ALKPHOS 83 09/05/2014 0914   BILITOT 0.5 03/24/2015 1205   BILITOT 0.62 09/05/2014 0914   GFRNONAA >60 03/24/2015 1205   GFRAA >60 03/24/2015 1205     ASSESSMENT and THERAPY PLAN:  Stage IA ER positive, PR positive, HER-2 negative carcinoma of the right breast Mastectomy followed by TRAM flap Significant allergic reaction to Zoladex with facial swelling and hives, required treatment with steroids and Benadryl over a two-week period Mild nausea with tamoxifen Laparoscopic BSO Dr. Alycia Rossetti at [facility]  She is overall doing well. She is back to work full time.  She is active.  We discussed that she does not need to take her previously prescribed antidepressant if she is feeling better.  She will have labs done today. We will call with her hormone and Vitamin D levels next week. Her cell number is [phone]  I have discussed with her to continue having mammograms regularly, only now it will be a unilateral mammogram for the left breast. I have ordered her unilateral left screening mammogram for here at [facility] on September 30th.  I will prescribe Arimidex after going over her hormone level results from today and after she finishes her current Tamoxifen prescription as she just got if refilled.  She knows to call and let us know if she experiences any problems when she switches to Arimidex, specifically if she experiences any significant joint pain. We reviewed the risks and benefits of AI therapy in detail.  We  will follow her bone density levels. If she has not had a bone density scan by the time we see her again in 3 months, we will order one to be done. She believes that Dr. Buelah Manis is ordering one for her.  Orders Placed This Encounter  Procedures  . MM Digital Screening Unilat L    Amy, esigned, no prob stand, nbs    Standing Status: Future     Number of Occurrences:      Standing Expiration Date: 03/23/2016    Order Specific Question:  Reason for Exam (SYMPTOM  OR DIAGNOSIS REQUIRED)    Answer:  history breast cancer, screeening    Order Specific Question:  Is the patient pregnant?    Answer:  No    Order Specific Question:  Preferred imaging location?    Answer:  [facility]  . CBC with Differential  . Comprehensive metabolic panel  . Vitamin D 25 hydroxy  . Estrogens, Total  . Follicle stimulating hormone    All questions were answered. The patient knows to call the clinic with any problems, questions or concerns. We can certainly see the patient much sooner if necessary.  This document serves as a record of services personally performed by Ancil Linsey, MD. It was created on her behalf by Arlyce Harman, a trained medical scribe. The creation of this record is based on the scribe's personal observations and the provider's statements to them. This document has been checked and approved by the attending provider.     I have reviewed the above documentation for accuracy and completeness, and I agree with the above.   Molli Hazard, MD

## 2015-03-24 NOTE — Patient Instructions (Signed)
Lydia Stevens at [facility] Discharge Instructions  RECOMMENDATIONS MADE BY THE CONSULTANT AND ANY TEST RESULTS WILL BE SENT TO YOUR REFERRING PHYSICIAN.  Seen and discussion with Dr. Whitney Muse. Call the cancer center with any questions and/or concerns that you have. Lab work drawn today.  Follow up appt in 3 months.     Thank you for choosing [facility] at [facility] to provide your oncology and hematology care.  To afford each patient quality time with our provider, please arrive at least 15 minutes before your scheduled appointment time.    You need to re-schedule your appointment should you arrive 10 or more minutes late.  We strive to give you quality time with our providers, and arriving late affects you and other patients whose appointments are after yours.  Also, if you no show three or more times for appointments you may be dismissed from the clinic at the providers discretion.     Again, thank you for choosing [facility].  Our hope is that these requests will decrease the amount of time that you wait before being seen by our physicians.       _____________________________________________________________  Should you have questions after your visit to [facility], please contact our office at [phone] between the hours of 8:30 a.m. and 4:30 p.m.  Voicemails left after 4:30 p.m. will not be returned until the following business day.  For prescription refill requests, have your pharmacy contact our office.

## 2015-03-24 NOTE — Progress Notes (Signed)
LABS DRAWN

## 2015-03-25 LAB — VITAMIN D 25 HYDROXY (VIT D DEFICIENCY, FRACTURES): VIT D 25 HYDROXY: 35.3 ng/mL (ref 30.0–100.0)

## 2015-03-25 LAB — FOLLICLE STIMULATING HORMONE: FSH: 37.5 m[IU]/mL

## 2015-03-28 LAB — ESTROGENS, TOTAL: Estrogen: 93 pg/mL

## 2015-04-18 ENCOUNTER — Encounter: Payer: Self-pay | Admitting: Family Medicine

## 2015-04-18 ENCOUNTER — Ambulatory Visit (INDEPENDENT_AMBULATORY_CARE_PROVIDER_SITE_OTHER): Payer: 59 | Admitting: Family Medicine

## 2015-04-18 VITALS — BP 128/70 | HR 74 | Temp 98.9°F | Resp 14 | Ht 72.0 in | Wt 185.0 lb

## 2015-04-18 DIAGNOSIS — Z853 Personal history of malignant neoplasm of breast: Secondary | ICD-10-CM

## 2015-04-18 DIAGNOSIS — M419 Scoliosis, unspecified: Secondary | ICD-10-CM | POA: Insufficient documentation

## 2015-04-18 DIAGNOSIS — M4126 Other idiopathic scoliosis, lumbar region: Secondary | ICD-10-CM | POA: Diagnosis not present

## 2015-04-18 DIAGNOSIS — F4321 Adjustment disorder with depressed mood: Secondary | ICD-10-CM

## 2015-04-18 NOTE — Progress Notes (Signed)
Patient ID: Lydia Stevens, female   DOB: [birth year], 44 y.o.   MRN: [id number]   Subjective:    Patient ID: Lydia Stevens, female    DOB: 1971/02/17, 44 y.o.   MRN: [id number]  Patient presents for 2 month F/U  patient follow-up chronic medical problems. She never started the Effexor after our last visit she states she is not depressed and has not had any hot flashes. 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 She is following with her oncologist she is scheduled to have a mammogram done in October. She still in the process of having reconstruction done on her right breast she had tattooing done as the nipple did not take after the last surgery. Her only concern is that she has known history of thoracic or lumbar scoliosis since a child she gets back pain on and off. She is unable take tramadol during the day because it makes her very sleepy she will take Excedrin as this helps. She also uses her TENS unit. She had x-rays done back in 2012 which confirmed a scoliosis though did not appear to be significantly severe enough to have surgical intervention.    Review Of Systems:  GEN- denies fatigue, fever, weight loss,weakness, recent illness HEENT- denies eye drainage, change in vision, nasal discharge, CVS- denies chest pain, palpitations RESP- denies SOB, cough, wheeze ABD- denies N/V, change in stools, abd pain GU- denies dysuria, hematuria, dribbling, incontinence MSK-+s joint pain, muscle aches, injury Neuro- denies headache, dizziness, syncope, seizure activity       Objective:    BP 128/70 mmHg  Pulse 74  Temp(Src) 98.9 F (37.2 C) (Oral)  Resp 14  Ht 6' (1.829 m)  Wt 185 lb (83.915 kg)  BMI 25.08 kg/m2 GEN- NAD, alert and oriented x3 CVS- RRR, no murmur RESP-CTAB MSK- scolioosis noted lower thoracic and lumbar, NT, increased musculature on left parapsinals, fair ROM Skin- tatooing of right nipple d/c/i Psych- smiling,  normal affect and mood EXT- No edema Pulses- Radial, DP- 2+        Assessment & Plan:      Problem List Items Addressed This Visit    Scoliosis of lumbar spine - Primary   History of breast cancer      Note: This dictation was prepared with Dragon dictation along with smaller phrase technology. Any transcriptional errors that result from this process are unintentional.

## 2015-04-18 NOTE — Assessment & Plan Note (Signed)
Chronic scoliosis, at this time I doubt surgical intervention is needed. I discussed that we can send her to the spine and scoliosis clinic for an evaluation but she wishes to hold off at this time. We discussed working on her core to help strengthen her back she also can use her TENS unit during the morning and tramadol at night

## 2015-04-18 NOTE — Patient Instructions (Addendum)
Continue current medications Continue tramadol F/U as needed or May for physical

## 2015-04-18 NOTE — Assessment & Plan Note (Signed)
She did not require the medication at this time will just monitor things have significantly improved and she has a good support group. She states she was still like to keep the medication on hand if she starts that she will let me know

## 2015-04-21 ENCOUNTER — Encounter (HOSPITAL_COMMUNITY): Payer: Self-pay | Admitting: Hematology & Oncology

## 2015-04-25 ENCOUNTER — Encounter (HOSPITAL_COMMUNITY): Payer: 59

## 2015-05-23 ENCOUNTER — Other Ambulatory Visit (HOSPITAL_COMMUNITY): Payer: Self-pay | Admitting: Oncology

## 2015-05-23 DIAGNOSIS — C50311 Malignant neoplasm of lower-inner quadrant of right female breast: Secondary | ICD-10-CM

## 2015-05-23 MED ORDER — TAMOXIFEN CITRATE 20 MG PO TABS: 20.0000 mg | ORAL_TABLET | Freq: Every day | ORAL | Status: DC

## 2015-05-29 ENCOUNTER — Other Ambulatory Visit (HOSPITAL_COMMUNITY): Payer: Self-pay | Admitting: Hematology & Oncology

## 2015-05-29 ENCOUNTER — Ambulatory Visit (HOSPITAL_COMMUNITY)
Admission: RE | Admit: 2015-05-29 | Discharge: 2015-05-29 | Disposition: A | Discharge status: Home / Self Care | Payer: 59 | Source: Ambulatory Visit | Attending: Hematology & Oncology | Admitting: Hematology & Oncology

## 2015-05-29 DIAGNOSIS — Z1231 Encounter for screening mammogram for malignant neoplasm of breast: Secondary | ICD-10-CM | POA: Diagnosis present

## 2015-05-29 DIAGNOSIS — C50311 Malignant neoplasm of lower-inner quadrant of right female breast: Secondary | ICD-10-CM

## 2015-05-29 DIAGNOSIS — Z139 Encounter for screening, unspecified: Secondary | ICD-10-CM

## 2015-06-23 ENCOUNTER — Encounter (HOSPITAL_COMMUNITY): Payer: 59 | Attending: Hematology & Oncology | Admitting: Hematology & Oncology

## 2015-06-23 ENCOUNTER — Encounter (HOSPITAL_COMMUNITY): Payer: Self-pay | Admitting: Hematology & Oncology

## 2015-06-23 VITALS — BP 112/63 | HR 82 | Temp 98.7°F | Resp 16 | Wt 185.4 lb

## 2015-06-23 DIAGNOSIS — Z79899 Other long term (current) drug therapy: Secondary | ICD-10-CM

## 2015-06-23 DIAGNOSIS — Z170 Estrogen receptor positive status [ER+]: Secondary | ICD-10-CM | POA: Diagnosis not present

## 2015-06-23 DIAGNOSIS — C50311 Malignant neoplasm of lower-inner quadrant of right female breast: Secondary | ICD-10-CM

## 2015-06-23 MED ORDER — ANASTROZOLE 1 MG PO TABS: 1.0000 mg | ORAL_TABLET | Freq: Every day | ORAL | Status: DC

## 2015-06-23 NOTE — Patient Instructions (Signed)
[place] at [facility] Discharge Instructions  RECOMMENDATIONS MADE BY THE CONSULTANT AND ANY TEST RESULTS WILL BE SENT TO YOUR REFERRING PHYSICIAN.  Exam and discussion by Dr. Whitney Muse Stop the Tamoxifen Start Arimidex  - take as directed Will do Bone Density Report any new lumps, bone pain, shortness of breath or other symptoms.  Follow-up in 6 weeks with labs and office visit.  Thank you for choosing [place] at [facility] to provide your oncology and hematology care.  To afford each patient quality time with our provider, please arrive at least 15 minutes before your scheduled appointment time.    You need to re-schedule your appointment should you arrive 10 or more minutes late.  We strive to give you quality time with our providers, and arriving late affects you and other patients whose appointments are after yours.  Also, if you no show three or more times for appointments you may be dismissed from the clinic at the providers discretion.     Again, thank you for choosing [facility].  Our hope is that these requests will decrease the amount of time that you wait before being seen by our physicians.       _____________________________________________________________  Should you have questions after your visit to [facility], please contact our office at [phone] between the hours of 8:30 a.m. and 4:30 p.m.  Voicemails left after 4:30 p.m. will not be returned until the following business day.  For prescription refill requests, have your pharmacy contact our office.

## 2015-06-23 NOTE — Progress Notes (Signed)
Lydia Blackbird, MD [address] [place] [address]  Right breast invasive ductal carcinoma (with associated DCIS) status post mastectomy 07/27/2014 followed by reconstruction with TRAM flap  2 cm tumor with intermediate grade DCIS, lymphovascular invasion noted, 2 SLN negative, grade 3, T1 C. N0 M0 stage IA  ER+ (100%), PR+ (71%), Her-2 negative  Core needle biopsy of L breast lesion c/w fibroadenoma  Oncotype DX score 24, 16% risk of recurrence    Breast cancer of lower-inner quadrant of right female breast ([place])   06/07/2014 Initial Biopsy Right breast needle biopsy 5:00 position: Invasive ductal carcinoma with DCIS, ER 100%, PR 71%, Ki-67 33%, HER-2 negative ratio 1.03   06/17/2014 Breast MRI Right breast: 10 x 7 x 5 mm biopsy-proven IDC with DCIS, left breast 7 x 7 x 7 mm fibroadenoma, left upper quadrant of the abdomen abutting the peritoneum 3 x 1.1 cm oval soft tissue mass   07/27/2014 Surgery Right breast mastectomy: Invasive ductal carcinoma grade 3, 2 cm, intermediate grade DCIS, lymphovascular invasion identified, 2 SLN negative, T1 C. N0 M0 stage IA, ER positive, PR 7%, HER-2 negative, Ki-67 33%   07/27/2014 Oncotype testing Recurrence Score of 24, placing patient in the intermediate risk group   09/06/2014 -  Chemotherapy Taxotere/Cytoxan    09/08/2014 Adverse Reaction Nasuea and voming three times x 2 days.  Added Aloxi and Emend to anti-emetic regimen.   10/18/2014 -  Chemotherapy Zoladex   10/27/2014 Adverse Reaction Hives, secondary to Zoladex?   02/16/2015 Surgery Laparoscopic BSO with Dr. Alycia Rossetti     CURRENT THERAPY: Tamoxifen  INTERVAL HISTORY: Lydia Stevens comes in today for follow-up of a right breast cancer, ER positive, PR positive and HER-2 negative.   Lydia Stevens is here today with her husband.  She is a very pleasant woman, smiling and laughing.  She says she started taking some biotin to help her hair grow back. Her husband says that she wants it  longer faster, and what she needs more than anything is some patience.  She states that she feels good most of the time. She says that if she didn't look in the mirror and see her short hair, or didn't see her scars, she wouldn't know that she's been through anything.  She states that, after her surgery, if she overeats, her stomach hurts. She states that the scar tissue around her stomach is like hard knots.  She says work is good; she mostly works from home. She comments that it's stressful, and her husband says "it was stressful to start with." She confirms that life is getting back to normal.  Her husband says "I've gotta keep her on her toes." She says he refuses to let her get down. He says "let's keep her laughing."  She confirms that the family's doing great. She denies any lumps or bumps that she's noticed other than her scar tissue around her abdominal incision. She comments on a little bit of numbness in her reconstruction and along the abdominal incision site.  MEDICAL HISTORY: Past Medical History  Diagnosis Date  . Scoliosis   . Breast cancer ([place])     2015  . Anemia     before   . Hives Mar 3rd 2016  . GERD (gastroesophageal reflux disease)   . History of breast cancer 12/29/2014  . Blood transfusion without reported diagnosis   . Hyperlipidemia     has Breast cancer of lower-inner quadrant of right female breast ([place]);  Genetic testing; Oral candidiasis; Hives; Acquired absence of breast and nipple; History of breast cancer; Situational depression; Wound infection ([place]); and Scoliosis of lumbar spine on her problem list.    Breast cancer risk profile:  She menarched at age of 11  She had one pregnancy, her first child was born at age 38  She is currently on Depo Provera shots for unknown time in the [facility] She was never exposed to fertility medications or hormone replacement therapy.  She has family history of Breast/GYN/GI cancer; 2 half-sisters with breast  cancer    is allergic to oxycodone.  Ms. Cleere does not currently have medications on file.  SURGICAL HISTORY: Past Surgical History  Procedure Laterality Date  . Cesarean section  1996  . Wisdom tooth extraction    . Mastectomy w/ sentinel node biopsy Right 07/27/2014    Procedure: RIGHT MASTECTOMY WITH RIGHT AXILLARY SENTINEL LYMPH NODE BIOPSY;  Surgeon: Alphonsa Overall, MD;  Location: [facility];  Service: General;  Laterality: Right;  . Latissimus flap to breast Right 07/27/2014    Procedure: TRAM FLAP RECONSTUCTION RIGHT CHEST;  Surgeon: Irene Limbo, MD;  Location: [facility];  Service: Plastics;  Laterality: Right;  . Portacath placement Left 09/05/2013  . Breast reduction surgery Left 12/06/2014    Procedure: LEFT BREAST REDUCTION FOR ASYMMETRY;  Surgeon: Irene Limbo, MD;  Location: [facility];  Service: Plastics;  Laterality: Left;  . Breast reconstruction Right 12/06/2014    Procedure: RIGHT NIPPLE AREOLAR RECONSTRUCTION WITH FULL THICKNESS SKIN GRAFT FROM RIGHT THIGH;  Surgeon: Irene Limbo, MD;  Location: [facility];  Service: Plastics;  Laterality: Right;  . Port-a-cath removal Left 12/06/2014    Procedure: REMOVAL PORT-A-CATH;  Surgeon: Irene Limbo, MD;  Location: [facility];  Service: Plastics;  Laterality: Left;  . Oophorectomy  2016    SOCIAL HISTORY: Social History   Social History  . Marital Status: Married    Spouse Name: N/A  . Number of Children: 1  . Years of Education: N/A   Occupational History  . Not on file.   Social History Main Topics  . Smoking status: Never Smoker   . Smokeless tobacco: Never Used  . Alcohol Use: No  . Drug Use: No  . Sexual Activity: Yes    Birth Control/ Protection: None   Other Topics Concern  . Not on file   Social History Narrative    FAMILY HISTORY: Family History  Problem Relation Age of Onset  . Breast cancer Paternal Aunt 49  . Heart attack Father   .  Arthritis Father   . Heart disease Father     Massive MI  . Breast cancer Maternal Aunt 11  . Breast cancer Sister 11  . Cancer Sister   . Breast cancer Maternal Aunt 4  . Arthritis Mother   . Hypertension Mother   . Asthma Sister   . Alcohol abuse Brother   . Hypertension Brother   . Cancer Sister     1/2 - breast cancer    Review of Systems  Constitutional: Negative for fever, chills, weight loss and malaise/fatigue.  HENT: Negative for congestion, hearing loss, nosebleeds, sore throat and tinnitus.   Eyes: Negative for blurred vision, double vision, pain and discharge.  Respiratory: Negative for cough, hemoptysis, sputum production, shortness of breath and wheezing.   Cardiovascular: Negative for chest pain, palpitations, claudication, leg swelling and PND.  Gastrointestinal: Negative for heartburn, vomiting, abdominal pain, diarrhea, constipation, blood in  stool and melena.  Genitourinary: Negative for dysuria, urgency, frequency and hematuria.  Musculoskeletal: Negative for myalgias, joint pain and falls.  Skin: Negative for itching and rash.  Neurological: Negative for dizziness, tingling, tremors, sensory change, speech change, focal weakness, seizures, loss of consciousness, weakness and headaches.  Endo/Heme/Allergies: Does not bruise/bleed easily.  Psychiatric/Behavioral: Negative for depression, suicidal ideas, memory loss and substance abuse. The patient is not nervous/anxious and does not have insomnia.    14 point review of systems was performed and is negative except as detailed under history of present illness and above   PHYSICAL EXAMINATION  ECOG PERFORMANCE STATUS: 0 - Asymptomatic  Filed Vitals:   06/23/15 0913  BP: 112/63  Pulse: 82  Temp: 98.7 F (37.1 C)  Resp: 16    Physical Exam  Constitutional: She is oriented to person, place, and time and well-developed, well-nourished, and in no distress.  HENT:  Head: Normocephalic and atraumatic.    Nose: Nose normal.  Mouth/Throat: Oropharynx is clear and moist. No oropharyngeal exudate.  Eyes: Conjunctivae and EOM are normal. Pupils are equal, round, and reactive to light. Right eye exhibits no discharge. Left eye exhibits no discharge. No scleral icterus.  Neck: Normal range of motion. Neck supple. No tracheal deviation present. No thyromegaly present.  Cardiovascular: Normal rate, regular rhythm and normal heart sounds.  Exam reveals no gallop and no friction rub.   No murmur heard. Pulmonary/Chest: Effort normal and breath sounds normal. She has no wheezes. She has no rales.  Abdominal: Soft. Bowel sounds are normal. She exhibits no distension and no mass. There is no tenderness. There is no rebound and no guarding. Well healed surgical incision sites. Scar tissue around her abdominal surgical sites. Musculoskeletal: Normal range of motion. She exhibits no edema.  Lymphadenopathy:    She has no cervical adenopathy.  Neurological: She is alert and oriented to person, place, and time. She has normal reflexes. No cranial nerve deficit. Gait normal. Coordination normal.  Skin: Skin is warm and dry. No rash noted.  Psychiatric: Mood, memory, affect and judgment normal.  Nursing note and vitals reviewed. Breast: Well-healed reduction and mastectomy sites. Very little scar tissue on her left breast reduction. No palpable nodularity or suspicious masses   LABORATORY DATA: I have reviewed the data as listed  CBC    Component Value Date/Time   WBC 7.7 03/24/2015 1205   WBC 7.6 09/05/2014 0914   RBC 4.50 03/24/2015 1205   RBC 4.17 09/05/2014 0914   HGB 12.0 03/24/2015 1205   HGB 11.7 09/05/2014 0914   HCT 36.0 03/24/2015 1205   HCT 35.4 09/05/2014 0914   PLT 240 03/24/2015 1205   PLT 297 09/05/2014 0914   MCV 80.0 03/24/2015 1205   MCV 84.9 09/05/2014 0914   MCH 26.7 03/24/2015 1205   MCH 28.1 09/05/2014 0914   MCHC 33.3 03/24/2015 1205   MCHC 33.1 09/05/2014 0914   RDW  16.1* 03/24/2015 1205   RDW 14.9* 09/05/2014 0914   LYMPHSABS 1.8 03/24/2015 1205   LYMPHSABS 1.6 09/05/2014 0914   MONOABS 0.3 03/24/2015 1205   MONOABS 0.5 09/05/2014 0914   EOSABS 0.1 03/24/2015 1205   EOSABS 0.2 09/05/2014 0914   BASOSABS 0.0 03/24/2015 1205   BASOSABS 0.0 09/05/2014 0914   CMP     Component Value Date/Time   NA 140 03/24/2015 1205   NA 139 09/05/2014 0914   K 4.1 03/24/2015 1205   K 3.8 09/05/2014 0914   CL 105 03/24/2015 1205  CO2 28 03/24/2015 1205   CO2 29 09/05/2014 0914   GLUCOSE 91 03/24/2015 1205   GLUCOSE 88 09/05/2014 0914   BUN 12 03/24/2015 1205   BUN 6.0* 09/05/2014 0914   CREATININE 0.85 03/24/2015 1205   CREATININE 0.76 01/19/2015 0910   CREATININE 0.8 09/05/2014 0914   CALCIUM 9.2 03/24/2015 1205   CALCIUM 9.3 09/05/2014 0914   PROT 7.3 03/24/2015 1205   PROT 6.6 09/05/2014 0914   ALBUMIN 3.8 03/24/2015 1205   ALBUMIN 3.4* 09/05/2014 0914   AST 16 03/24/2015 1205   AST 16 09/05/2014 0914   ALT 14 03/24/2015 1205   ALT 9 09/05/2014 0914   ALKPHOS 67 03/24/2015 1205   ALKPHOS 83 09/05/2014 0914   BILITOT 0.5 03/24/2015 1205   BILITOT 0.62 09/05/2014 0914   GFRNONAA >60 03/24/2015 1205   GFRAA >60 03/24/2015 1205     ASSESSMENT and THERAPY PLAN:  Stage IA ER positive, PR positive, HER-2 negative carcinoma of the right breast Mastectomy followed by TRAM flap Significant allergic reaction to Zoladex with facial swelling and hives, required treatment with steroids and Benadryl over a two-week period Mild nausea with tamoxifen Laparoscopic BSO Dr. Alycia [facility]setti at [facility]  She is overall doing well. She is back to work full time.  She is active.  We discussed that she does not need to take her previously prescribed antidepressant if she is feeling better.  I have discussed with her to continue having mammograms regularly, only now it will be a unilateral mammogram for the left breast. I have ordered her unilateral left screening mammogram  for here at [facility] on September 30th.  I will prescribe Arimidex for her to begin once daily.  She knows to call and let us know if she experiences any problems when she switches to Arimidex, specifically if she experiences any significant joint pain. We reviewed the risks and benefits of AI therapy in detail.  We will see her six weeks after she starts her AI, then move her visits out again to three months. I will check her labs next time as well since she is starting a new medication.  We will order a bone density and review the results at f/u.   Orders Placed This Encounter  Procedures  . DG Bone Density    Amy, esigned, nbs    Standing Status: Future     Number of Occurrences:      Standing Expiration Date: 06/22/2016    Order Specific Question:  Reason for Exam (SYMPTOM  OR DIAGNOSIS REQUIRED)    Answer:  high risk medication    Order Specific Question:  Is the patient pregnant?    Answer:  No    Order Specific Question:  Preferred imaging location?    Answer:  [facility]  . CBC with Differential    Standing Status: Future     Number of Occurrences:      Standing Expiration Date: 06/22/2016  . Comprehensive metabolic panel    Standing Status: Future     Number of Occurrences:      Standing Expiration Date: 06/22/2016    All questions were answered. The patient knows to call the clinic with any problems, questions or concerns. We can certainly see the patient much sooner if necessary.  This document serves as a record of services personally performed by Ancil Linsey, MD. It was created on her behalf by Toni Amend, a trained medical scribe. The creation of this record is based on the  scribe's personal observations and the provider's statements to them. This document has been checked and approved by the attending provider.     I have reviewed the above documentation for accuracy and completeness, and I agree with the above.   Molli Hazard,  MD

## 2015-06-26 ENCOUNTER — Ambulatory Visit (HOSPITAL_COMMUNITY): Payer: 59 | Admitting: Hematology & Oncology

## 2015-06-28 ENCOUNTER — Ambulatory Visit (HOSPITAL_COMMUNITY)
Admission: RE | Admit: 2015-06-28 | Discharge: 2015-06-28 | Disposition: A | Discharge status: Home / Self Care | Payer: 59 | Source: Ambulatory Visit | Attending: Hematology & Oncology | Admitting: Hematology & Oncology

## 2015-06-28 DIAGNOSIS — Z780 Asymptomatic menopausal state: Secondary | ICD-10-CM | POA: Insufficient documentation

## 2015-06-28 DIAGNOSIS — Z79899 Other long term (current) drug therapy: Secondary | ICD-10-CM | POA: Insufficient documentation

## 2015-08-04 ENCOUNTER — Encounter (HOSPITAL_COMMUNITY): Payer: Self-pay | Admitting: Hematology & Oncology

## 2015-08-04 ENCOUNTER — Encounter (HOSPITAL_BASED_OUTPATIENT_CLINIC_OR_DEPARTMENT_OTHER): Payer: 59

## 2015-08-04 ENCOUNTER — Encounter (HOSPITAL_COMMUNITY): Payer: 59 | Attending: Hematology & Oncology | Admitting: Hematology & Oncology

## 2015-08-04 VITALS — BP 111/61 | HR 87 | Temp 98.5°F | Resp 14 | Wt 185.0 lb

## 2015-08-04 DIAGNOSIS — E559 Vitamin D deficiency, unspecified: Secondary | ICD-10-CM | POA: Diagnosis not present

## 2015-08-04 DIAGNOSIS — Z79899 Other long term (current) drug therapy: Secondary | ICD-10-CM

## 2015-08-04 DIAGNOSIS — C50911 Malignant neoplasm of unspecified site of right female breast: Secondary | ICD-10-CM

## 2015-08-04 DIAGNOSIS — M8580 Other specified disorders of bone density and structure, unspecified site: Secondary | ICD-10-CM

## 2015-08-04 DIAGNOSIS — M810 Age-related osteoporosis without current pathological fracture: Secondary | ICD-10-CM | POA: Insufficient documentation

## 2015-08-04 DIAGNOSIS — C50311 Malignant neoplasm of lower-inner quadrant of right female breast: Secondary | ICD-10-CM | POA: Insufficient documentation

## 2015-08-04 LAB — COMPREHENSIVE METABOLIC PANEL
ALBUMIN: 3.7 g/dL (ref 3.5–5.0)
ALT: 10 U/L — ABNORMAL LOW (ref 14–54)
AST: 15 U/L (ref 15–41)
Alkaline Phosphatase: 70 U/L (ref 38–126)
Anion gap: 6 (ref 5–15)
BUN: 10 mg/dL (ref 6–20)
CHLORIDE: 105 mmol/L (ref 101–111)
CO2: 31 mmol/L (ref 22–32)
CREATININE: 0.85 mg/dL (ref 0.44–1.00)
Calcium: 9.3 mg/dL (ref 8.9–10.3)
GFR calc Af Amer: >60 mL/min (ref >60)
GLUCOSE: 84 mg/dL (ref 65–99)
POTASSIUM: 3.9 mmol/L (ref 3.5–5.1)
Sodium: 142 mmol/L (ref 135–145)
Total Bilirubin: 0.7 mg/dL (ref 0.3–1.2)
Total Protein: 7.1 g/dL (ref 6.5–8.1)

## 2015-08-04 LAB — CBC WITH DIFFERENTIAL/PLATELET
BASOS ABS: 0 10*3/uL (ref 0.0–0.1)
BASOS PCT: 0 %
EOS ABS: 0.1 10*3/uL (ref 0.0–0.7)
EOS PCT: 1 %
HCT: 35.9 % — ABNORMAL LOW (ref 36.0–46.0)
Hemoglobin: 12.1 g/dL (ref 12.0–15.0)
Lymphocytes Relative: 19 %
Lymphs Abs: 1.6 10*3/uL (ref 0.7–4.0)
MCH: 28.5 pg (ref 26.0–34.0)
MCHC: 33.7 g/dL (ref 30.0–36.0)
MCV: 84.7 fL (ref 78.0–100.0)
MONO ABS: 0.5 10*3/uL (ref 0.1–1.0)
Monocytes Relative: 6 %
Neutro Abs: 5.9 10*3/uL (ref 1.7–7.7)
Neutrophils Relative %: 74 %
PLATELETS: 242 10*3/uL (ref 150–400)
RBC: 4.24 MIL/uL (ref 3.87–5.11)
RDW: 14.6 % (ref 11.5–15.5)
WBC: 8 10*3/uL (ref 4.0–10.5)

## 2015-08-04 NOTE — Progress Notes (Signed)
Lydia Blackbird, MD [address] [place] [address]  Right breast invasive ductal carcinoma (with associated DCIS) status post mastectomy 07/27/2014 followed by reconstruction with TRAM flap  2 cm tumor with intermediate grade DCIS, lymphovascular invasion noted, 2 SLN negative, grade 3, T1 C. N0 M0 stage IA  ER+ (100%), PR+ (71%), Her-2 negative  Core needle biopsy of L breast lesion c/w fibroadenoma  Oncotype DX score 24, 16% risk of recurrence    Breast cancer of lower-inner quadrant of right female breast ([facility])   06/07/2014 Initial Biopsy Right breast needle biopsy 5:00 position: Invasive ductal carcinoma with DCIS, ER 100%, PR 71%, Ki-67 33%, HER-2 negative ratio 1.03   06/17/2014 Breast MRI Right breast: 10 x 7 x 5 mm biopsy-proven IDC with DCIS, left breast 7 x 7 x 7 mm fibroadenoma, left upper quadrant of the abdomen abutting the peritoneum 3 x 1.1 cm oval soft tissue mass   07/27/2014 Surgery Right breast mastectomy: Invasive ductal carcinoma grade 3, 2 cm, intermediate grade DCIS, lymphovascular invasion identified, 2 SLN negative, T1 C. N0 M0 stage IA, ER positive, PR 7%, HER-2 negative, Ki-67 33%   07/27/2014 Oncotype testing Recurrence Score of 24, placing patient in the intermediate risk group   09/06/2014 -  Chemotherapy Taxotere/Cytoxan    09/08/2014 Adverse Reaction Nasuea and voming three times x 2 days.  Added Aloxi and Emend to anti-emetic regimen.   10/18/2014 -  Chemotherapy Zoladex   10/27/2014 Adverse Reaction Hives, secondary to Zoladex?   02/16/2015 Surgery Laparoscopic BSO with Dr. Alycia Rossetti     CURRENT THERAPY: Tamoxifen  INTERVAL HISTORY: Lydia Stevens comes in today for follow-up of a right breast cancer, ER positive, PR positive and HER-2 negative.   Lydia Stevens is here today with her husband.  When asked how she's doing, she says she's feeling wonderful.  She does remark that her new medication, Arimidex, is expensive. She is paying $181 dollars a  month, and is interested in assistance. She says "that would be awesome."  She says that the new medication doesn't make her feel any different than the Tamoxifen, and she likes that it's a smaller pill.  Her only concern regards her bone density. When she came for her bone density, she remarks that they said it wasn't good. She notes she was told that her long term use of depo provera for 19 years could decrease her bone density.  She says she had a wonderful Thanksgiving. Her husband says he's ready for Christmas, but she says that's because "he doesn't do anything! He doesn't shop, he doesn't do anything."  She says her abdomen is just numb, post-surgical. She has no other complaints or concerns.  She denies nausea and vomiting, constipation, diarrhea, change in appetite or energy level.   MEDICAL HISTORY: Past Medical History  Diagnosis Date  . Scoliosis   . Breast cancer ([place])     2015  . Anemia     before   . Hives Mar 3rd 2016  . GERD (gastroesophageal reflux disease)   . History of breast cancer 12/29/2014  . Blood transfusion without reported diagnosis   . Hyperlipidemia     has Breast cancer of lower-inner quadrant of right female breast ([place]); Genetic testing; Oral candidiasis; Hives; Acquired absence of breast and nipple; History of breast cancer; Situational depression; Wound infection ([place]); and Scoliosis of lumbar spine on her problem list.    Breast cancer risk profile:  She menarched at age of  34  She had one pregnancy, her first child was born at age 29  She is currently on Depo Provera shots for unknown time in the [facility] She was never exposed to fertility medications or hormone replacement therapy.  She has family history of Breast/GYN/GI cancer; 2 half-sisters with breast cancer    is allergic to oxycodone.  Lydia Stevens does not currently have medications on file.  SURGICAL HISTORY: Past Surgical History  Procedure Laterality Date  .  Cesarean section  1996  . Wisdom tooth extraction    . Mastectomy w/ sentinel node biopsy Right 07/27/2014    Procedure: RIGHT MASTECTOMY WITH RIGHT AXILLARY SENTINEL LYMPH NODE BIOPSY;  Surgeon: Alphonsa Overall, MD;  Location: [place];  Service: General;  Laterality: Right;  . Latissimus flap to breast Right 07/27/2014    Procedure: TRAM FLAP RECONSTUCTION RIGHT CHEST;  Surgeon: Irene Limbo, MD;  Location: [place];  Service: Plastics;  Laterality: Right;  . Portacath placement Left 09/05/2013  . Breast reduction surgery Left 12/06/2014    Procedure: LEFT BREAST REDUCTION FOR ASYMMETRY;  Surgeon: Irene Limbo, MD;  Location: [place];  Service: Plastics;  Laterality: Left;  . Breast reconstruction Right 12/06/2014    Procedure: RIGHT NIPPLE AREOLAR RECONSTRUCTION WITH FULL THICKNESS SKIN GRAFT FROM RIGHT THIGH;  Surgeon: Irene Limbo, MD;  Location: [facility];  Service: Plastics;  Laterality: Right;  . Port-a-cath removal Left 12/06/2014    Procedure: REMOVAL PORT-A-CATH;  Surgeon: Irene Limbo, MD;  Location: [facility];  Service: Plastics;  Laterality: Left;  . Oophorectomy  2016    SOCIAL HISTORY: Social History   Social History  . Marital Status: Married    Spouse Name: N/A  . Number of Children: 1  . Years of Education: N/A   Occupational History  . Not on file.   Social History Main Topics  . Smoking status: Never Smoker   . Smokeless tobacco: Never Used  . Alcohol Use: No  . Drug Use: No  . Sexual Activity: Yes    Birth Control/ Protection: None   Other Topics Concern  . Not on file   Social History Narrative    FAMILY HISTORY: Family History  Problem Relation Age of Onset  . Breast cancer Paternal Aunt 59  . Heart attack Father   . Arthritis Father   . Heart disease Father     Massive MI  . Breast cancer Maternal Aunt 82  . Breast cancer Sister 43  . Cancer Sister   . Breast cancer Maternal Aunt 39  .  Arthritis Mother   . Hypertension Mother   . Asthma Sister   . Alcohol abuse Brother   . Hypertension Brother   . Cancer Sister     1/2 - breast cancer    Review of Systems  Constitutional: Negative for fever, chills, weight loss and malaise/fatigue.  HENT: Negative for congestion, hearing loss, nosebleeds, sore throat and tinnitus.   Eyes: Negative for blurred vision, double vision, pain and discharge.  Respiratory: Negative for cough, hemoptysis, sputum production, shortness of breath and wheezing.   Cardiovascular: Negative for chest pain, palpitations, claudication, leg swelling and PND.  Gastrointestinal: Negative for heartburn, vomiting, abdominal pain, diarrhea, constipation, blood in stool and melena.  Genitourinary: Negative for dysuria, urgency, frequency and hematuria.  Musculoskeletal: Negative for myalgias, joint pain and falls.  Skin: Negative for itching and rash.  Neurological: Negative for dizziness, tingling, tremors, sensory change, speech change, focal weakness, seizures,  loss of consciousness, weakness and headaches.  Endo/Heme/Allergies: Does not bruise/bleed easily.  Psychiatric/Behavioral: Negative for depression, suicidal ideas, memory loss and substance abuse. The patient is not nervous/anxious and does not have insomnia.    14 point review of systems was performed and is negative except as detailed under history of present illness and above   PHYSICAL EXAMINATION  ECOG PERFORMANCE STATUS: 0 - Asymptomatic  Filed Vitals:   08/04/15 0957  BP: 111/61  Pulse: 87  Temp: 98.5 F (36.9 C)  Resp: 14    Physical Exam  Constitutional: She is oriented to person, place, and time and well-developed, well-nourished, and in no distress.  HENT:  Head: Normocephalic and atraumatic.  Nose: Nose normal.  Mouth/Throat: Oropharynx is clear and moist. No oropharyngeal exudate.  Eyes: Conjunctivae and EOM are normal. Pupils are equal, round, and reactive to light.  Right eye exhibits no discharge. Left eye exhibits no discharge. No scleral icterus.  Neck: Normal range of motion. Neck supple. No tracheal deviation present. No thyromegaly present.  Cardiovascular: Normal rate, regular rhythm and normal heart sounds.  Exam reveals no gallop and no friction rub.   No murmur heard. Pulmonary/Chest: Effort normal and breath sounds normal. She has no wheezes. She has no rales.  Abdominal: Soft. Bowel sounds are normal. She exhibits no distension and no mass. There is no tenderness. There is no rebound and no guarding. Well healed surgical incision sites. Scar tissue around her abdominal surgical sites. Can palpate tram flat in upper abdomen Musculoskeletal: Normal range of motion. She exhibits no edema.  Lymphadenopathy:    She has no cervical adenopathy.  Neurological: She is alert and oriented to person, place, and time. She has normal reflexes. No cranial nerve deficit. Gait normal. Coordination normal.  Skin: Skin is warm and dry. No rash noted.  Psychiatric: Mood, memory, affect and judgment normal.  Nursing note and vitals reviewed. Breast: Well-healed reduction and mastectomy sites. R breast reconstruction WNL, well healed surgical incision sites. L breast without palpable abnormalities. No suspicious skin changes.   LABORATORY DATA: I have reviewed the data as listed  CBC    Component Value Date/Time   WBC 8.0 08/04/2015 0957   WBC 7.6 09/05/2014 0914   RBC 4.24 08/04/2015 0957   RBC 4.17 09/05/2014 0914   HGB 12.1 08/04/2015 0957   HGB 11.7 09/05/2014 0914   HCT 35.9* 08/04/2015 0957   HCT 35.4 09/05/2014 0914   PLT 242 08/04/2015 0957   PLT 297 09/05/2014 0914   MCV 84.7 08/04/2015 0957   MCV 84.9 09/05/2014 0914   MCH 28.5 08/04/2015 0957   MCH 28.1 09/05/2014 0914   MCHC 33.7 08/04/2015 0957   MCHC 33.1 09/05/2014 0914   RDW 14.6 08/04/2015 0957   RDW 14.9* 09/05/2014 0914   LYMPHSABS 1.6 08/04/2015 0957   LYMPHSABS 1.6 09/05/2014  0914   MONOABS 0.5 08/04/2015 0957   MONOABS 0.5 09/05/2014 0914   EOSABS 0.1 08/04/2015 0957   EOSABS 0.2 09/05/2014 0914   BASOSABS 0.0 08/04/2015 0957   BASOSABS 0.0 09/05/2014 0914   CMP     Component Value Date/Time   NA 142 08/04/2015 0957   NA 139 09/05/2014 0914   K 3.9 08/04/2015 0957   K 3.8 09/05/2014 0914   CL 105 08/04/2015 0957   CO2 31 08/04/2015 0957   CO2 29 09/05/2014 0914   GLUCOSE 84 08/04/2015 0957   GLUCOSE 88 09/05/2014 0914   BUN 10 08/04/2015 0957   BUN  6.0* 09/05/2014 0914   CREATININE 0.85 08/04/2015 0957   CREATININE 0.76 01/19/2015 0910   CREATININE 0.8 09/05/2014 0914   CALCIUM 9.3 08/04/2015 0957   CALCIUM 9.3 09/05/2014 0914   PROT 7.1 08/04/2015 0957   PROT 6.6 09/05/2014 0914   ALBUMIN 3.7 08/04/2015 0957   ALBUMIN 3.4* 09/05/2014 0914   AST 15 08/04/2015 0957   AST 16 09/05/2014 0914   ALT 10* 08/04/2015 0957   ALT 9 09/05/2014 0914   ALKPHOS 70 08/04/2015 0957   ALKPHOS 83 09/05/2014 0914   BILITOT 0.7 08/04/2015 0957   BILITOT 0.62 09/05/2014 0914   GFRNONAA >60 08/04/2015 0957   GFRAA >60 08/04/2015 0957    IMAGING:   EXAM: DUAL X-RAY ABSORPTIOMETRY (DXA) FOR BONE MINERAL DENSITY  IMPRESSION: Ordering Physician: Dr. Patrici Ranks,  Your patient Lydia Stevens completed a BMD test on 06/28/2015 using the [place] (software version: 14.10) manufactured by [employer]. The following summarizes the results of our evaluation. PATIENT BIOGRAPHICAL: Name: Lydia Stevens, Lydia Stevens Patient ID: [id number] Birth Date: Apr 21, 1971 Height: 68.0 in. Gender: Female Exam Date: 06/28/2015 Weight: 185.0 lbs. Indications: Hx Breast Ca, Low Calcium Intake, Partial Hysterectomy, Post Menopausal Fractures: Treatments: Anastrozole, Vitamin D DENSITOMETRY RESULTS: Site Region Measured Date Measured Age WHO Classification Young Adult T-score BMD %Change vs. Previous  Significant Change (*) DualFemur Neck Left 06/28/2015 43.4 Osteopenia -1.2 0.868 g/cm2 2.1% - DualFemur Neck Left 01/26/2004 32.0 Osteopenia -1.4 0.850 g/cm2 - -  Right Forearm Radius 33% 06/28/2015 43.4 Normal 0.9 0.778 g/cm2 9.1% Yes Right Forearm Radius 33% 01/26/2004 32.0 Normal 0.0 0.713 g/cm2 - - ASSESSMENT: BMD as determined from Femur Neck Left is 0.868 g/cm2 with a T-Score of -1.2. This patient is considered osteopenic according to [facility]) criteria. Compared with the prior study on 01/26/2004, the BMD of the left femoral neck shows no statistically significant change. Compared with the prior study on 01/26/2004, the BMD of the right forearm shows a statistically significant increase. (Lumbar spine was not utilized due to advanced degenerative changes.)  World Pharmacologist (WHO) criteria for post-menopausal, Caucasian Women: Normal: T-score at or above -1 SD Osteopenia: T-score between -1 and -2.5 SD Osteoporosis: T-score at or below -2.5 SD  RECOMMENDATIONS: [place] recommends that FDA-approved medial therapies be considered in postmenopausal women and men age 73 or older with a: 1. Hip or vertebral (clinical or morphometric) fracture. 2. T-Score of < -2.5 at the spine or hip. 3. Ten-year fracture probability by FRAX of 3% or greater for hip fracture or 20% or greater for major osteoporotic fracture.  All treatment decisions require clinical judgment and consideration of individual patient factors, including patient preferences, co-morbidities, previous drug use, risk factors not captured in the FRAX model (e.g. falls, vitamin D deficiency, increased bone turnover, interval significant decline in bone density) and possible under-or over-estimation of fracture risk by FRAX.  All patients should ensure an adequate intake of dietary calcium (1200 mg/d) and vitamin D (800 IU daily) unless  contraindicated.  FOLLOW-UP: People with diagnosed cases of osteoporosis or osteopenia should be regularly tested for bone mineral density. For patients eligible for Medicare, routine testing is allowed once every 2 years. Testing frequency can be increased for patients who have rapidly progressing disease, or for those who are receiving medical therapy to restore bone mass.  I have reviewed this report, and agree with the above findings.  [facility] Your patient Lydia Stevens completed a  FRAX assessment on 06/28/2015 using the [place] (analysis version: 14.10) manufactured by [employer]. The following summarizes the results of our evaluation.  PATIENT BIOGRAPHICAL: Name: Lydia Stevens, Lydia Stevens Patient ID: [id number] Birth Date: 06-20-1971 Height: 68.0 in. Gender: Female [age] Weight: 185.0 lbs. Ethnicity: Black Exam Date: 06/28/2015  FRAX* RESULTS: (version: 3.5) 10-year Probability of Fracture1 Major Osteoporotic Fracture2 Hip Fracture 1.2% 0.1% Population: [place] (Black) Risk Factors: None  Based on DualFemur (Left) Neck BMD  1 -The 10-year probability of fracture may be lower than reported if the patient has received treatment. 2 -Major Osteoporotic Fracture: Clinical Spine, Forearm, Hip or Shoulder  *FRAX is a Materials engineer of the [employer] for Metabolic Bone Disease, a [place] (WHO) [facility].  ASSESSMENT: The probability of a major osteoporotic fracture is 1.2% within the next ten years.  The probability of a hip fracture is 0.1% within the next ten years.   Electronically Signed  By: David Martinique M.D.  On: 06/28/2015 10:28   ASSESSMENT and THERAPY PLAN:  Stage IA ER positive, PR positive, HER-2 negative carcinoma of the right breast Mastectomy followed by TRAM flap Significant  allergic reaction to Zoladex with facial swelling and hives, required treatment with steroids and Benadryl over a two-week period Mild nausea with tamoxifen Laparoscopic BSO Dr. Alycia Rossetti at [facility] 06/28/2015 with osteopenia Aromasin started 06/2015  She is overall doing well. She is back to work full time.  She is active.  We discussed that she does not need to take her previously prescribed antidepressant if she is feeling better.  She is up to date with mammography. Breast exam today is benign.   Her arimidex is quite expensive and I believe we can find it cheaper for her. We will look at her obtaining it through [facility] outpatient pharmacy. We can also ask Angie for help.  We reviewed her bone density. We discussed the following data in regards to prolia in conjunction with her AI. In a two-year randomized trial of denosumab (60 mg subcutaneously every six months) versus placebo in 250 postmenopausal osteopenic (mean T-scores -0.88 to -1.33) women receiving adjuvant AI therapy, subjects in the denosumab group had significant increases in LS, TH, and femoral neck BMD compared with placebo (between group differences of 7.6, 4.7, and 3.6 percent, respectively. There were no vertebral fractures, and nonvertebral fractures occurred in eight subjects in each group. The most common side effects included arthralgia, back pain, and fatigue. Given her young age we did discuss potentially starting prolia.  I addressed osteonecrosis of the jaw. She has excellent dental care.   She is to remain on calcium and vitamin D. She has vitamin D deficiency and we will recheck her vitamin D level at follow-up.  She will return in 3 months with labs, sooner if needed.   Orders Placed This Encounter  Procedures  . Vitamin D 25 hydroxy    Standing Status: Future     Number of Occurrences:      Standing Expiration Date: 08/03/2016  . Comprehensive metabolic panel    Standing Status: Standing     Number of  Occurrences: 4     Standing Expiration Date: 08/03/2016   All questions were answered. The patient knows to call the clinic with any problems, questions or concerns. We can certainly see the patient much sooner if necessary.  This document serves as a record of services personally performed by Ancil Linsey, MD. It was created on her  behalf by Toni Amend, a trained medical scribe. The creation of this record is based on the scribe's personal observations and the provider's statements to them. This document has been checked and approved by the attending provider.     I have reviewed the above documentation for accuracy and completeness, and I agree with the above.   Molli Hazard, MD

## 2015-08-04 NOTE — Progress Notes (Signed)
Labs drawn

## 2015-08-04 NOTE — Patient Instructions (Addendum)
[place] at [facility] Discharge Instructions  RECOMMENDATIONS MADE BY THE CONSULTANT AND ANY TEST RESULTS WILL BE SENT TO YOUR REFERRING PHYSICIAN.   Exam completed by Dr Whitney Muse today We can start you on prolia, this will help improve your bones over time We will start this in about 2 weeks, we need to get this authorized first. Make sure your take 1000mg  of calcium Continue taking prescription vitamin d We will recheck your vitamin D level at your next visit  Notify us or your dentist if you are going to have any more than a cleaning done. Please let your dentist know that you are on this medication. Continue taking your arimidix daily.  I sent angie a message to see if she could get you some assistance   Return to see the doctor in 3 months Please call the clinic if you have any questions or concerns    Thank you for choosing [place] at [facility] to provide your oncology and hematology care.  To afford each patient quality time with our provider, please arrive at least 15 minutes before your scheduled appointment time.    You need to re-schedule your appointment should you arrive 10 or more minutes late.  We strive to give you quality time with our providers, and arriving late affects you and other patients whose appointments are after yours.  Also, if you no show three or more times for appointments you may be dismissed from the clinic at the providers discretion.     Again, thank you for choosing [facility].  Our hope is that these requests will decrease the amount of time that you wait before being seen by our physicians.       _____________________________________________________________  Should you have questions after your visit to [facility], please contact our office at [phone] between the hours of 8:30 a.m. and 4:30 p.m.  Voicemails left after 4:30 p.m. will not be returned until the  following business day.  For prescription refill requests, have your pharmacy contact our office.       Denosumab injection What is this medicine? DENOSUMAB (den oh sue mab) slows bone breakdown. Prolia is used to treat osteoporosis in women after menopause and in men. Delton See is used to prevent bone fractures and other bone problems caused by cancer bone metastases. Delton See is also used to treat giant cell tumor of the bone. This medicine may be used for other purposes; ask your health care provider or pharmacist if you have questions. What should I tell my health care provider before I take this medicine? They need to know if you have any of these conditions: -dental disease -eczema -infection or history of infections -kidney disease or on dialysis -low blood calcium or vitamin D -malabsorption syndrome -scheduled to have surgery or tooth extraction -taking medicine that contains denosumab -thyroid or parathyroid disease -an unusual reaction to denosumab, other medicines, foods, dyes, or preservatives -pregnant or trying to get pregnant -breast-feeding How should I use this medicine? This medicine is for injection under the skin. It is given by a health care professional in a hospital or clinic setting. If you are getting Prolia, a special MedGuide will be given to you by the pharmacist with each prescription and refill. Be sure to read this information carefully each time. For Prolia, talk to your pediatrician regarding the use of this medicine in children. Special care may be needed. For [place],  talk to your pediatrician regarding the use of this medicine in children. While this drug may be prescribed for children as young as 13 years for selected conditions, precautions do apply. Overdosage: If you think you have taken too much of this medicine contact a poison control center or emergency room at once. NOTE: This medicine is only for you. Do not share this medicine with others. What if I  miss a dose? It is important not to miss your dose. Call your doctor or health care professional if you are unable to keep an appointment. What may interact with this medicine? Do not take this medicine with any of the following medications: -other medicines containing denosumab This medicine may also interact with the following medications: -medicines that suppress the immune system -medicines that treat cancer -steroid medicines like prednisone or cortisone This list may not describe all possible interactions. Give your health care provider a list of all the medicines, herbs, non-prescription drugs, or dietary supplements you use. Also tell them if you smoke, drink alcohol, or use illegal drugs. Some items may interact with your medicine. What should I watch for while using this medicine? Visit your doctor or health care professional for regular checks on your progress. Your doctor or health care professional may order blood tests and other tests to see how you are doing. Call your doctor or health care professional if you get a cold or other infection while receiving this medicine. Do not treat yourself. This medicine may decrease your body's ability to fight infection. You should make sure you get enough calcium and vitamin D while you are taking this medicine, unless your doctor tells you not to. Discuss the foods you eat and the vitamins you take with your health care professional. See your dentist regularly. Brush and floss your teeth as directed. Before you have any dental work done, tell your dentist you are receiving this medicine. Do not become pregnant while taking this medicine or for 5 months after stopping it. Women should inform their doctor if they wish to become pregnant or think they might be pregnant. There is a potential for serious side effects to an unborn child. Talk to your health care professional or pharmacist for more information. What side effects may I notice from  receiving this medicine? Side effects that you should report to your doctor or health care professional as soon as possible: -allergic reactions like skin rash, itching or hives, swelling of the face, lips, or tongue -breathing problems -chest pain -fast, irregular heartbeat -feeling faint or lightheaded, falls -fever, chills, or any other sign of infection -muscle spasms, tightening, or twitches -numbness or tingling -skin blisters or bumps, or is dry, peels, or red -slow healing or unexplained pain in the mouth or jaw -unusual bleeding or bruising Side effects that usually do not require medical attention (Report these to your doctor or health care professional if they continue or are bothersome.): -muscle pain -stomach upset, gas This list may not describe all possible side effects. Call your doctor for medical advice about side effects. You may report side effects to FDA at 1-800-FDA-1088. Where should I keep my medicine? This medicine is only given in a clinic, doctor's office, or other health care setting and will not be stored at home. NOTE: This sheet is a summary. It may not cover all possible information. If you have questions about this medicine, talk to your doctor, pharmacist, or health care provider.    2016, Elsevier/Gold Standard. (2012-02-10 12:37:47)

## 2015-08-05 DIAGNOSIS — E559 Vitamin D deficiency, unspecified: Secondary | ICD-10-CM | POA: Insufficient documentation

## 2015-08-09 ENCOUNTER — Other Ambulatory Visit (HOSPITAL_COMMUNITY): Payer: Self-pay | Admitting: Hematology & Oncology

## 2015-08-10 ENCOUNTER — Telehealth (HOSPITAL_COMMUNITY): Payer: Self-pay | Admitting: Hematology & Oncology

## 2015-08-10 ENCOUNTER — Other Ambulatory Visit (HOSPITAL_COMMUNITY): Payer: Self-pay | Admitting: Oncology

## 2015-08-10 DIAGNOSIS — C50311 Malignant neoplasm of lower-inner quadrant of right female breast: Secondary | ICD-10-CM

## 2015-08-10 MED ORDER — ANASTROZOLE 1 MG PO TABS: 1.0000 mg | ORAL_TABLET | Freq: Every day | ORAL | Status: DC

## 2015-08-10 NOTE — Telephone Encounter (Signed)
PC TO [employer]. ?'D IF CPT J0897 REQUIRE AUTH PER SUSAN AUTH IS NOT REQUIRED CALL [id number]

## 2015-08-11 ENCOUNTER — Ambulatory Visit: Payer: 59 | Admitting: Gynecologic Oncology

## 2015-08-11 NOTE — Telephone Encounter (Signed)
Per Robynn Pane, PA-C, patient notified that she no longer needs to be taking the 50,000units of vitamin D on weekly basis but needs to be taking 2000 units daily and that she can get it over the counter.  Verbalized understanding of instructions.

## 2015-08-18 ENCOUNTER — Other Ambulatory Visit (HOSPITAL_COMMUNITY): Payer: Self-pay | Admitting: Oncology

## 2015-08-18 ENCOUNTER — Encounter (HOSPITAL_COMMUNITY): Payer: Self-pay | Admitting: Oncology

## 2015-08-18 ENCOUNTER — Encounter (HOSPITAL_COMMUNITY): Payer: Self-pay

## 2015-08-18 ENCOUNTER — Other Ambulatory Visit (HOSPITAL_COMMUNITY): Payer: 59

## 2015-08-18 ENCOUNTER — Encounter (HOSPITAL_BASED_OUTPATIENT_CLINIC_OR_DEPARTMENT_OTHER): Payer: 59

## 2015-08-18 VITALS — BP 128/57 | HR 71 | Temp 98.8°F | Resp 18

## 2015-08-18 DIAGNOSIS — M810 Age-related osteoporosis without current pathological fracture: Secondary | ICD-10-CM

## 2015-08-18 DIAGNOSIS — M8580 Other specified disorders of bone density and structure, unspecified site: Secondary | ICD-10-CM | POA: Diagnosis not present

## 2015-08-18 DIAGNOSIS — C50911 Malignant neoplasm of unspecified site of right female breast: Secondary | ICD-10-CM

## 2015-08-18 HISTORY — DX: Age-related osteoporosis without current pathological fracture: M81.0

## 2015-08-18 MED ORDER — DENOSUMAB 60 MG/ML ~~LOC~~ SOLN
60.0000 mg | Freq: Once | SUBCUTANEOUS | Status: AC
  Administered 2015-08-18: 60 mg via SUBCUTANEOUS
  Filled 2015-08-18: qty 1

## 2015-08-18 NOTE — Patient Instructions (Signed)
[facility] at [facility] Discharge Instructions  RECOMMENDATIONS MADE BY THE CONSULTANT AND ANY TEST RESULTS WILL BE SENT TO YOUR REFERRING PHYSICIAN.  Prolia injection today. Return as scheduled for lab work and office visit.   Thank you for choosing [facility] at [facility] to provide your oncology and hematology care.  To afford each patient quality time with our provider, please arrive at least 15 minutes before your scheduled appointment time.    You need to re-schedule your appointment should you arrive 10 or more minutes late.  We strive to give you quality time with our providers, and arriving late affects you and other patients whose appointments are after yours.  Also, if you no show three or more times for appointments you may be dismissed from the clinic at the providers discretion.     Again, thank you for choosing [facility].  Our hope is that these requests will decrease the amount of time that you wait before being seen by our physicians.       _____________________________________________________________  Should you have questions after your visit to [facility], please contact our office at [phone] between the hours of 8:30 a.m. and 4:30 p.m.  Voicemails left after 4:30 p.m. will not be returned until the following business day.  For prescription refill requests, have your pharmacy contact our office.    

## 2015-08-18 NOTE — Progress Notes (Signed)
1130:  Lydia Stevens presents today for injection per the provider's orders.  Prolia administration without incident; see MAR for injection details.  Patient tolerated procedure well and without incident.  No questions or complaints noted at this time.

## 2015-09-16 ENCOUNTER — Other Ambulatory Visit (HOSPITAL_COMMUNITY): Payer: Self-pay | Admitting: Hematology & Oncology

## 2015-10-14 ENCOUNTER — Other Ambulatory Visit (HOSPITAL_COMMUNITY): Payer: Self-pay | Admitting: Hematology & Oncology

## 2015-10-26 ENCOUNTER — Encounter: Payer: Self-pay | Admitting: Family Medicine

## 2015-10-26 ENCOUNTER — Ambulatory Visit (INDEPENDENT_AMBULATORY_CARE_PROVIDER_SITE_OTHER): Payer: 59 | Admitting: Family Medicine

## 2015-10-26 VITALS — BP 120/80 | HR 86 | Temp 98.6°F | Resp 18 | Wt 193.0 lb

## 2015-10-26 DIAGNOSIS — M4126 Other idiopathic scoliosis, lumbar region: Secondary | ICD-10-CM

## 2015-10-26 DIAGNOSIS — M419 Scoliosis, unspecified: Secondary | ICD-10-CM

## 2015-10-26 DIAGNOSIS — M545 Low back pain, unspecified: Secondary | ICD-10-CM

## 2015-10-26 MED ORDER — DIAZEPAM 10 MG PO TABS: 10.0000 mg | ORAL_TABLET | Freq: Three times a day (TID) | ORAL | Status: DC | PRN

## 2015-10-26 NOTE — Progress Notes (Signed)
Subjective:    Patient ID: Lydia Stevens, female    DOB: 22-Dec-1970, 45 y.o.   MRN: [id number]  HPI  patient reports one week of episodic muscle spasms all along her spine. Muscle spasms are primarily in the right cervical paraspinal muscles, the right thoracic paraspinal muscles and also the left upper lumbar paraspinal muscles. She denies any injury. She denies any umbness or tingling in her legs or in her arms. She denies any weakness in her extremities. She has a negative straight le raise bilaterally. Past Medical History  Diagnosis Date  . Scoliosis   . Breast cancer ([place])     2015  . Anemia     before   . Hives Mar 3rd 2016  . GERD (gastroesophageal reflux disease)   . History of breast cancer 12/29/2014  . Blood transfusion without reported diagnosis   . Hyperlipidemia   . Osteoporosis 08/18/2015   Past Surgical History  Procedure Laterality Date  . Cesarean section  1996  . Wisdom tooth extraction    . Mastectomy w/ sentinel node biopsy Right 07/27/2014    Procedure: RIGHT MASTECTOMY WITH RIGHT AXILLARY SENTINEL LYMPH NODE BIOPSY;  Surgeon: Alphonsa Overall, MD;  Location: [place];  Service: General;  Laterality: Right;  . Latissimus flap to breast Right 07/27/2014    Procedure: TRAM FLAP RECONSTUCTION RIGHT CHEST;  Surgeon: Irene Limbo, MD;  Location: [place];  Service: Plastics;  Laterality: Right;  . Portacath placement Left 09/05/2013  . Breast reduction surgery Left 12/06/2014    Procedure: LEFT BREAST REDUCTION FOR ASYMMETRY;  Surgeon: Irene Limbo, MD;  Location: [place];  Service: Plastics;  Laterality: Left;  . Breast reconstruction Right 12/06/2014    Procedure: RIGHT NIPPLE AREOLAR RECONSTRUCTION WITH FULL THICKNESS SKIN GRAFT FROM RIGHT THIGH;  Surgeon: Irene Limbo, MD;  Location: [place];  Service: Plastics;  Laterality: Right;  . Port-a-cath removal Left 12/06/2014    Procedure: REMOVAL PORT-A-CATH;  Surgeon: Irene Limbo, MD;  Location: [place];  Service: Plastics;  Laterality: Left;  . Oophorectomy  2016   Current Outpatient Prescriptions on File Prior to Visit  Medication Sig Dispense Refill  . anastrozole (ARIMIDEX) 1 MG tablet TAKE 1 TABLET(1 MG) BY MOUTH DAILY 30 tablet 0  . BIOTIN PO Take 1 tablet by mouth daily.    . Vitamin D, Ergocalciferol, (DRISDOL) 50000 UNITS CAPS capsule Take 50,000 Units by mouth every 7 (seven) days.   3  . traMADol (ULTRAM) 50 MG tablet Take 50 mg by mouth every 6 (six) hours as needed. Reported on 10/26/2015     Current Facility-Administered Medications on File Prior to Visit  Medication Dose Route Frequency Provider Last Rate Last Dose  . diphenhydrAMINE (BENADRYL) injection 50 mg  50 mg Intravenous Once Patrici Ranks, MD       Allergies  Allergen Reactions  . Oxycodone Nausea Only   Social History   Social History  . Marital Status: Married    Spouse Name: N/A  . Number of Children: 1  . Years of Education: N/A   Occupational History  . Not on file.   Social History Main Topics  . Smoking status: Never Smoker   . Smokeless tobacco: Never Used  . Alcohol Use: No  . Drug Use: No  . Sexual Activity: Yes    Birth Control/ Protection: None   Other Topics Concern  . Not on file   Social History Narrative  Review of Systems  All other systems reviewed and are negative.      Objective:   Physical Exam  Cardiovascular: Normal rate, regular rhythm and normal heart sounds.   Pulmonary/Chest: Effort normal and breath sounds normal. No respiratory distress. She has no wheezes. She has no rales.  Musculoskeletal:       Cervical back: She exhibits tenderness, pain and spasm. She exhibits normal range of motion and no bony tenderness.       Thoracic back: She exhibits tenderness, pain and spasm. She exhibits normal range of motion.       Lumbar back: She exhibits pain.  Vitals reviewed.         Assessment & Plan:    Scoliosis of lumbar spine  Midline low back pain without sciatica   I recommended IM, 5-10 mg every 8 hours as needed for muscle spasms. She can also use ibuprofen 800 mg every 8 hours. I recommended heat. Also recommended that she use a TENS unit which she has at home. Recheck in one week if no better or immediately if worse

## 2015-11-02 ENCOUNTER — Encounter (HOSPITAL_COMMUNITY): Payer: 59

## 2015-11-02 ENCOUNTER — Encounter (HOSPITAL_COMMUNITY): Payer: 59 | Attending: Hematology & Oncology | Admitting: Hematology & Oncology

## 2015-11-02 ENCOUNTER — Encounter (HOSPITAL_COMMUNITY): Payer: Self-pay | Admitting: Hematology & Oncology

## 2015-11-02 VITALS — BP 124/55 | HR 76 | Temp 98.6°F | Resp 20 | Wt 193.0 lb

## 2015-11-02 DIAGNOSIS — Z79899 Other long term (current) drug therapy: Secondary | ICD-10-CM | POA: Diagnosis present

## 2015-11-02 DIAGNOSIS — C50311 Malignant neoplasm of lower-inner quadrant of right female breast: Secondary | ICD-10-CM | POA: Insufficient documentation

## 2015-11-02 DIAGNOSIS — E559 Vitamin D deficiency, unspecified: Secondary | ICD-10-CM

## 2015-11-02 DIAGNOSIS — Z170 Estrogen receptor positive status [ER+]: Secondary | ICD-10-CM

## 2015-11-02 DIAGNOSIS — M810 Age-related osteoporosis without current pathological fracture: Secondary | ICD-10-CM | POA: Insufficient documentation

## 2015-11-02 DIAGNOSIS — M62830 Muscle spasm of back: Secondary | ICD-10-CM

## 2015-11-02 DIAGNOSIS — N951 Menopausal and female climacteric states: Secondary | ICD-10-CM

## 2015-11-02 DIAGNOSIS — M546 Pain in thoracic spine: Secondary | ICD-10-CM

## 2015-11-02 DIAGNOSIS — M8580 Other specified disorders of bone density and structure, unspecified site: Secondary | ICD-10-CM | POA: Diagnosis not present

## 2015-11-02 NOTE — Progress Notes (Signed)
Lydia Blackbird, MD [address] [place] [address]  Right breast invasive ductal carcinoma (with associated DCIS) status post mastectomy 07/27/2014 followed by reconstruction with TRAM flap  2 cm tumor with intermediate grade DCIS, lymphovascular invasion noted, 2 SLN negative, grade 3, T1 C. N0 M0 stage IA  ER+ (100%), PR+ (71%), Her-2 negative  Core needle biopsy of L breast lesion c/w fibroadenoma  Oncotype DX score 24, 16% risk of recurrence    Breast cancer of lower-inner quadrant of right female breast ([place])   06/07/2014 Initial Biopsy Right breast needle biopsy 5:00 position: Invasive ductal carcinoma with DCIS, ER 100%, PR 71%, Ki-67 33%, HER-2 negative ratio 1.03   06/17/2014 Breast MRI Right breast: 10 x 7 x 5 mm biopsy-proven IDC with DCIS, left breast 7 x 7 x 7 mm fibroadenoma, left upper quadrant of the abdomen abutting the peritoneum 3 x 1.1 cm oval soft tissue mass   07/27/2014 Surgery Right breast mastectomy: Invasive ductal carcinoma grade 3, 2 cm, intermediate grade DCIS, lymphovascular invasion identified, 2 SLN negative, T1 C. N0 M0 stage IA, ER positive, PR 7%, HER-2 negative, Ki-67 33%   07/27/2014 Oncotype testing Recurrence Score of 24, placing patient in the intermediate risk group   09/06/2014 -  Chemotherapy Taxotere/Cytoxan    09/08/2014 Adverse Reaction Nasuea and voming three times x 2 days.  Added Aloxi and Emend to anti-emetic regimen.   10/18/2014 -  Chemotherapy Zoladex   10/27/2014 Adverse Reaction Hives, secondary to Zoladex?   02/16/2015 Surgery Laparoscopic BSO with Dr. Alycia Rossetti     CURRENT THERAPY: Arimidex  INTERVAL HISTORY: Lydia Stevens comes in today for follow-up of a right breast cancer, ER positive, PR positive and HER-2 negative.   Lydia Stevens was here with her husband.  She said that for the last week and a half she has been having really bad muscle spasms in her upper back. She went to the doctor and got a prescription for  Valium. She said that she took one and stated that she has not felt like that in a while. She only took one and was able to sleep through the night only getting up once. She hasn't taken any since. She was also advised by her PCP to take  4 Ibuprofen three times a day with food which eases up her pain but she is scared to take so much. "that is a whole lot of medicine"   She denies any bone pain.   She has also been having really bad hot flashes. Her husband stated that she would just be sitting there and all of a sudden she would have a hot flash. She says that her back spasms are worse than her hot flashes. She has Scoliosis and says that her lower back pain has been chronic. This back pain is not the same as her Scoliosis. This pain is above her bra up to her shoulders.   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 She states that she loves this job because she feel like she can help people. She also thought that this back pain may be due to the way she sits at her job.  She is going on a cruise with her husband and son in September to [place] for her son's 80st birthday.  She notes that she otherwise feels very well. She denies changes in her appetite or energy level.   MEDICAL HISTORY: Past Medical History  Diagnosis Date  . Scoliosis   . Breast cancer ([place])     2015  . Anemia     before   . Hives Mar 3rd 2016  . GERD (gastroesophageal reflux disease)   . History of breast cancer 12/29/2014  . Blood transfusion without reported diagnosis   . Hyperlipidemia   . Osteoporosis 08/18/2015    has Breast cancer of lower-inner quadrant of right female breast ([place]); Genetic testing; Acquired absence of breast and nipple; History of breast cancer; Situational depression; Scoliosis of lumbar spine; Vitamin D deficiency; and Osteoporosis on her problem list.    Breast cancer risk profile:  She menarched at age of 87  She had one pregnancy, her first child was born  at age 2  She is currently on Depo Provera shots for unknown time in the [place] She was never exposed to fertility medications or hormone replacement therapy.  She has family history of Breast/GYN/GI cancer; 2 half-sisters with breast cancer    is allergic to oxycodone.  [name] does not currently have medications on file.  SURGICAL HISTORY: Past Surgical History  Procedure Laterality Date  . Cesarean section  1996  . Wisdom tooth extraction    . Mastectomy w/ sentinel node biopsy Right 07/27/2014    Procedure: RIGHT MASTECTOMY WITH RIGHT AXILLARY SENTINEL LYMPH NODE BIOPSY;  Surgeon: Alphonsa Overall, MD;  Location: [facility];  Service: General;  Laterality: Right;  . Latissimus flap to breast Right 07/27/2014    Procedure: TRAM FLAP RECONSTUCTION RIGHT CHEST;  Surgeon: Irene Limbo, MD;  Location: [facility];  Service: Plastics;  Laterality: Right;  . Portacath placement Left 09/05/2013  . Breast reduction surgery Left 12/06/2014    Procedure: LEFT BREAST REDUCTION FOR ASYMMETRY;  Surgeon: Irene Limbo, MD;  Location: [facility];  Service: Plastics;  Laterality: Left;  . Breast reconstruction Right 12/06/2014    Procedure: RIGHT NIPPLE AREOLAR RECONSTRUCTION WITH FULL THICKNESS SKIN GRAFT FROM RIGHT THIGH;  Surgeon: Irene Limbo, MD;  Location: [facility];  Service: Plastics;  Laterality: Right;  . Port-a-cath removal Left 12/06/2014    Procedure: REMOVAL PORT-A-CATH;  Surgeon: Irene Limbo, MD;  Location: [facility];  Service: Plastics;  Laterality: Left;  . Oophorectomy  2016    SOCIAL HISTORY: Social History   Social History  . Marital Status: Married    Spouse Name: N/A  . Number of Children: 1  . Years of Education: N/A   Occupational History  . Not on file.   Social History Main Topics  . Smoking status: Never Smoker   . Smokeless tobacco: Never Used  . Alcohol Use: No  . Drug Use: No  . Sexual Activity:  Yes    Birth Control/ Protection: None   Other Topics Concern  . Not on file   Social History Narrative    FAMILY HISTORY: Family History  Problem Relation Age of Onset  . Breast cancer Paternal Aunt 24  . Heart attack Father   . Arthritis Father   . Heart disease Father     Massive MI  . Breast cancer Maternal Aunt 74  . Breast cancer Sister 36  . Cancer Sister   . Breast cancer Maternal Aunt 24  . Arthritis Mother   . Hypertension Mother   . Asthma Sister   . Alcohol abuse Brother   . Hypertension Brother   . Cancer Sister     1/2 - breast cancer    Review of  Systems  Constitutional: Positive for hot flashes. Negative for fever, chills, weight loss and malaise/fatigue.  HENT: Negative for congestion, hearing loss, nosebleeds, sore throat and tinnitus.   Eyes: Negative for blurred vision, double vision, pain and discharge.  Respiratory: Negative for cough, hemoptysis, sputum production, shortness of breath and wheezing.   Cardiovascular: Negative for chest pain, palpitations, claudication, leg swelling and PND.  Gastrointestinal: Negative for heartburn, vomiting, abdominal pain, diarrhea, constipation, blood in stool and melena.  Genitourinary: Negative for dysuria, urgency, frequency and hematuria.  Musculoskeletal: Positive for myalgias. Negative for joint pain and falls.  Chronic Scoliosis in lower back. Upper back and shoulder pain. Skin: Negative for itching and rash.  Neurological: Positive for headaches. Headaches may be due to her back pain. Negative for dizziness, tingling, tremors, sensory change, speech change, focal weakness, seizures, loss of consciousness, and weakness Endo/Heme/Allergies: Does not bruise/bleed easily.  Psychiatric/Behavioral: Negative for depression, suicidal ideas, memory loss and substance abuse. The patient is not nervous/anxious and does not have insomnia.    14 point review of systems was performed and is negative except as detailed  under history of present illness and above   PHYSICAL EXAMINATION  ECOG PERFORMANCE STATUS: 0 - Asymptomatic  Filed Vitals:   11/02/15 1140  BP: 124/55  Pulse: 76  Temp: 98.6 F (37 C)  Resp: 20    Physical Exam  Constitutional: She is oriented to person, place, and time and well-developed, well-nourished, and in no distress. Well groomed. Excellent Mood HENT:  Head: Normocephalic and atraumatic.  Nose: Nose normal.  Mouth/Throat: Oropharynx is clear and moist. No oropharyngeal exudate.  Eyes: Conjunctivae and EOM are normal. Pupils are equal, round, and reactive to light. Right eye exhibits no discharge. Left eye exhibits no discharge. No scleral icterus.  Neck: Normal range of motion. Neck supple. No tracheal deviation present. No thyromegaly present.  Cardiovascular: Normal rate, regular rhythm and normal heart sounds.  Exam reveals no gallop and no friction rub.   No murmur heard. Pulmonary/Chest: Effort normal and breath sounds normal. She has no wheezes. She has no rales.  Abdominal: Soft. Bowel sounds are normal. She exhibits no distension and no mass. There is no tenderness. There is no rebound and no guarding. Well healed surgical incision sites. Scar tissue around her abdominal surgical sites. Can palpate tram flat in upper abdomen Musculoskeletal: Back muscles sore with palpitation Normal range of motion. She exhibits no edema.  Lymphadenopathy:    She has no cervical adenopathy.  Neurological: She is alert and oriented to person, place, and time. She has normal reflexes. No cranial nerve deficit. Gait normal. Coordination normal.  Skin: Skin is warm and dry. No rash noted.  Psychiatric: Mood, memory, affect and judgment normal.  Nursing note and vitals reviewed.  LABORATORY DATA: I have reviewed the data as listed  CBC    Component Value Date/Time   WBC 8.0 08/04/2015 0957   WBC 7.6 09/05/2014 0914   RBC 4.24 08/04/2015 0957   RBC 4.17 09/05/2014 0914   HGB  12.1 08/04/2015 0957   HGB 11.7 09/05/2014 0914   HCT 35.9* 08/04/2015 0957   HCT 35.4 09/05/2014 0914   PLT 242 08/04/2015 0957   PLT 297 09/05/2014 0914   MCV 84.7 08/04/2015 0957   MCV 84.9 09/05/2014 0914   MCH 28.5 08/04/2015 0957   MCH 28.1 09/05/2014 0914   MCHC 33.7 08/04/2015 0957   MCHC 33.1 09/05/2014 0914   RDW 14.6 08/04/2015 0957   RDW 14.9*  09/05/2014 0914   LYMPHSABS 1.6 08/04/2015 0957   LYMPHSABS 1.6 09/05/2014 0914   MONOABS 0.5 08/04/2015 0957   MONOABS 0.5 09/05/2014 0914   EOSABS 0.1 08/04/2015 0957   EOSABS 0.2 09/05/2014 0914   BASOSABS 0.0 08/04/2015 0957   BASOSABS 0.0 09/05/2014 0914   CMP     Component Value Date/Time   NA 142 08/04/2015 0957   NA 139 09/05/2014 0914   K 3.9 08/04/2015 0957   K 3.8 09/05/2014 0914   CL 105 08/04/2015 0957   CO2 31 08/04/2015 0957   CO2 29 09/05/2014 0914   GLUCOSE 84 08/04/2015 0957   GLUCOSE 88 09/05/2014 0914   BUN 10 08/04/2015 0957   BUN 6.0* 09/05/2014 0914   CREATININE 0.85 08/04/2015 0957   CREATININE 0.76 01/19/2015 0910   CREATININE 0.8 09/05/2014 0914   CALCIUM 9.3 08/04/2015 0957   CALCIUM 9.3 09/05/2014 0914   PROT 7.1 08/04/2015 0957   PROT 6.6 09/05/2014 0914   ALBUMIN 3.7 08/04/2015 0957   ALBUMIN 3.4* 09/05/2014 0914   AST 15 08/04/2015 0957   AST 16 09/05/2014 0914   ALT 10* 08/04/2015 0957   ALT 9 09/05/2014 0914   ALKPHOS 70 08/04/2015 0957   ALKPHOS 83 09/05/2014 0914   BILITOT 0.7 08/04/2015 0957   BILITOT 0.62 09/05/2014 0914   GFRNONAA >60 08/04/2015 0957   GFRAA >60 08/04/2015 0957   IMAGING:  EXAM: DUAL X-RAY ABSORPTIOMETRY (DXA) FOR BONE MINERAL DENSITY  IMPRESSION: Ordering Physician: Dr. Patrici Ranks,  Your patient Kenadie Royce completed a BMD test on 06/28/2015 using the [employer] (software version: 14.10) manufactured by [employer]. The following summarizes the results of our evaluation. PATIENT BIOGRAPHICAL: Name:  Missi, Mcmackin Patient ID: [id number] Birth Date: Jul 13, 1971 Height: 68.0 in. Gender: Female Exam Date: 06/28/2015 Weight: 185.0 lbs. Indications: Hx Breast Ca, Low Calcium Intake, Partial Hysterectomy, Post Menopausal Fractures: Treatments: Anastrozole, Vitamin D DENSITOMETRY RESULTS: Site Region Measured Date Measured Age WHO Classification Young Adult T-score BMD %Change vs. Previous Significant Change (*) DualFemur Neck Left 06/28/2015 43.4 Osteopenia -1.2 0.868 g/cm2 2.1% - DualFemur Neck Left 01/26/2004 32.0 Osteopenia -1.4 0.850 g/cm2 - -  Right Forearm Radius 33% 06/28/2015 43.4 Normal 0.9 0.778 g/cm2 9.1% Yes Right Forearm Radius 33% 01/26/2004 32.0 Normal 0.0 0.713 g/cm2 - - ASSESSMENT: BMD as determined from Femur Neck Left is 0.868 g/cm2 with a T-Score of -1.2. This patient is considered osteopenic according to [facility]) criteria. Compared with the prior study on 01/26/2004, the BMD of the left femoral neck shows no statistically significant change. Compared with the prior study on 01/26/2004, the BMD of the right forearm shows a statistically significant increase. (Lumbar spine was not utilized due to advanced degenerative changes.)  World Pharmacologist (WHO) criteria for post-menopausal, Caucasian Women: Normal: T-score at or above -1 SD Osteopenia: T-score between -1 and -2.5 SD Osteoporosis: T-score at or below -2.5 SD  RECOMMENDATIONS: [place] recommends that FDA-approved medial therapies be considered in postmenopausal women and men age 71 or older with a: 1. Hip or vertebral (clinical or morphometric) fracture. 2. T-Score of < -2.5 at the spine or hip. 3. Ten-year fracture probability by FRAX of 3% or greater for hip fracture or 20% or greater for major osteoporotic fracture.  All treatment decisions require clinical judgment and consideration of individual patient  factors, including patient preferences, co-morbidities, previous drug use, risk factors not captured in the FRAX model (e.g.  falls, vitamin D deficiency, increased bone turnover, interval significant decline in bone density) and possible under-or over-estimation of fracture risk by FRAX.  All patients should ensure an adequate intake of dietary calcium (1200 mg/d) and vitamin D (800 IU daily) unless contraindicated.  FOLLOW-UP: People with diagnosed cases of osteoporosis or osteopenia should be regularly tested for bone mineral density. For patients eligible for Medicare, routine testing is allowed once every 2 years. Testing frequency can be increased for patients who have rapidly progressing disease, or for those who are receiving medical therapy to restore bone mass.  I have reviewed this report, and agree with the above findings.  [facility] Your patient HARJIT DOUDS completed a FRAX assessment on 06/28/2015 using the [place] (analysis version: 14.10) manufactured by [employer]. The following summarizes the results of our evaluation.  PATIENT BIOGRAPHICAL: Name: Keeshia, Sanderlin Patient ID: [id number] Birth Date: 08/08/1971 Height: 68.0 in. Gender: Female [age] Weight: 185.0 lbs. Ethnicity: Black Exam Date: 06/28/2015  FRAX* RESULTS: (version: 3.5) 10-year Probability of Fracture1 Major Osteoporotic Fracture2 Hip Fracture 1.2% 0.1% Population: [place] (Black) Risk Factors: None  Based on DualFemur (Left) Neck BMD  1 -The 10-year probability of fracture may be lower than reported if the patient has received treatment. 2 -Major Osteoporotic Fracture: Clinical Spine, Forearm, Hip or Shoulder  *FRAX is a Materials engineer of the [employer] for Metabolic Bone Disease, a Boyd (WHO) [employer].  ASSESSMENT: The probability of a major osteoporotic fracture is 1.2% within the next ten years.  The probability of a hip fracture is 0.1% within the next ten years.   Electronically Signed  By: David Martinique M.D.  On: 06/28/2015 10:28   ASSESSMENT and THERAPY PLAN:  Stage IA ER positive, PR positive, HER-2 negative carcinoma of the right breast Mastectomy followed by TRAM flap Significant allergic reaction to Zoladex with facial swelling and hives, required treatment with steroids and Benadryl over a two-week period Mild nausea with tamoxifen Laparoscopic BSO Dr. Alycia Rossetti at [facility] 06/28/2015 with osteopenia, on Prolia 07/2015 Aromasin started 06/2015 Vitamin D deficiency  She is doing well and is to continue on calcium, vitamin D and arimidex. She is currently on vitamin D 2000 IU daily. She started prolia in December for osteopenia while on her AI therapy.   The patient received an information sheet about hot flashes. She was advised to call if she decided to try any intervention for her hot flashes.  She is advised to call next week if her back pain is not improved. I discussed further workup in regards to her back pain, but she currently wishes to hold off on this. She agrees to notify us if it worsens or changes.  She did not need any prescription refills.  She will have a follow up in 3 months.  All questions were answered. The patient knows to call the clinic with any problems, questions or concerns. We can certainly see the patient much sooner if necessary.  This document serves as a record of services personally performed by Ancil Linsey, MD. It was created on her behalf by Kandace Blitz, a trained medical scribe. The creation of this record is based on the scribe's personal observations and the provider's statements to them. This document has been checked and approved by the attending provider.     I have reviewed the above documentation for  accuracy and completeness, and I agree with the above. Penland,Shannon  Cyril Mourning, MD

## 2015-11-02 NOTE — Patient Instructions (Addendum)
[place] at [facility] Discharge Instructions  RECOMMENDATIONS MADE BY THE CONSULTANT AND ANY TEST RESULTS WILL BE SENT TO YOUR REFERRING PHYSICIAN.   Exam and discussion by Dr Whitney Muse today Prolia in June with labs prior  Take ibuprofen (800mg ) three times a day, you can take another valium today If your muscle spasms are not any better then please call us in a week and we can get a bone scan. Return to see the doctor in 3 months  Please call the clinic if you have any questions or concerns    Thank you for choosing [place] at [facility] to provide your oncology and hematology care.  To afford each patient quality time with our provider, please arrive at least 15 minutes before your scheduled appointment time.   Beginning January 23rd 2017 lab work for the [facility] will be done in the  Main lab at [facility] on 1st floor. If you have a lab appointment with the [facility] please come in thru the  Main Entrance and check in at the main information desk  You need to re-schedule your appointment should you arrive 10 or more minutes late.  We strive to give you quality time with our providers, and arriving late affects you and other patients whose appointments are after yours.  Also, if you no show three or more times for appointments you may be dismissed from the clinic at the providers discretion.     Again, thank you for choosing [facility]  Our hope is that these requests will decrease the amount of time that you wait before being seen by our physicians.       _____________________________________________________________  Should you have questions after your visit to [facility], please contact our office at [phone] between the hours of 8:30 a.m. and 4:30 p.m.  Voicemails left after 4:30 p.m. will not be returned until the following business day.  For prescription refill requests, have your  pharmacy contact our office.         Resources For Cancer Patients and their Caregivers ? American Cancer Society: Can assist with transportation, wigs, general needs, runs Look Good Feel Better.        ([phone] ? Cancer Care: Provides financial assistance, online support groups, medication/co-pay assistance.  1-800-813-HOPE 4375944157) ? [facility] Assists [place] cancer patients and their families through emotional , educational and financial support.  (571)090-7193 ? [place] DSS Where to apply for food stamps, Medicaid and utility assistance. 973-086-6962 ? [facility]: Transportation to medical appointments. 801 002 5282 ? Social Security Administration: May apply for disability if have a Stage IV cancer. 325-248-1591 506-163-9505 ? [employer], Disability and Transit Services: Assists with nutrition, care and transit needs. (618)640-5096

## 2015-11-03 LAB — VITAMIN D 25 HYDROXY (VIT D DEFICIENCY, FRACTURES): Vit D, 25-Hydroxy: 82.1 ng/mL (ref 30.0–100.0)

## 2015-11-16 ENCOUNTER — Other Ambulatory Visit (HOSPITAL_COMMUNITY): Payer: Self-pay | Admitting: Hematology & Oncology

## 2016-02-20 ENCOUNTER — Encounter (HOSPITAL_COMMUNITY): Payer: Self-pay | Admitting: Hematology & Oncology

## 2016-02-20 ENCOUNTER — Encounter (HOSPITAL_COMMUNITY): Payer: 59 | Attending: Hematology & Oncology | Admitting: Hematology & Oncology

## 2016-02-20 ENCOUNTER — Encounter (HOSPITAL_COMMUNITY): Payer: 59

## 2016-02-20 ENCOUNTER — Other Ambulatory Visit (HOSPITAL_COMMUNITY): Payer: Self-pay | Admitting: Hematology & Oncology

## 2016-02-20 VITALS — BP 111/66 | HR 83 | Temp 98.9°F | Resp 18 | Wt 190.3 lb

## 2016-02-20 DIAGNOSIS — C50311 Malignant neoplasm of lower-inner quadrant of right female breast: Secondary | ICD-10-CM | POA: Diagnosis not present

## 2016-02-20 DIAGNOSIS — K219 Gastro-esophageal reflux disease without esophagitis: Secondary | ICD-10-CM | POA: Diagnosis not present

## 2016-02-20 DIAGNOSIS — M8580 Other specified disorders of bone density and structure, unspecified site: Secondary | ICD-10-CM

## 2016-02-20 DIAGNOSIS — Z79811 Long term (current) use of aromatase inhibitors: Secondary | ICD-10-CM | POA: Diagnosis not present

## 2016-02-20 DIAGNOSIS — Z170 Estrogen receptor positive status [ER+]: Secondary | ICD-10-CM

## 2016-02-20 DIAGNOSIS — Z139 Encounter for screening, unspecified: Secondary | ICD-10-CM

## 2016-02-20 DIAGNOSIS — Z9221 Personal history of antineoplastic chemotherapy: Secondary | ICD-10-CM | POA: Insufficient documentation

## 2016-02-20 DIAGNOSIS — E785 Hyperlipidemia, unspecified: Secondary | ICD-10-CM | POA: Insufficient documentation

## 2016-02-20 DIAGNOSIS — E559 Vitamin D deficiency, unspecified: Secondary | ICD-10-CM

## 2016-02-20 LAB — COMPREHENSIVE METABOLIC PANEL
ALBUMIN: 4.1 g/dL (ref 3.5–5.0)
ALT: 13 U/L — ABNORMAL LOW (ref 14–54)
ANION GAP: 7 (ref 5–15)
AST: 16 U/L (ref 15–41)
Alkaline Phosphatase: 66 U/L (ref 38–126)
BILIRUBIN TOTAL: 0.6 mg/dL (ref 0.3–1.2)
BUN: 14 mg/dL (ref 6–20)
CO2: 29 mmol/L (ref 22–32)
Calcium: 9.9 mg/dL (ref 8.9–10.3)
Chloride: 103 mmol/L (ref 101–111)
Creatinine, Ser: 0.85 mg/dL (ref 0.44–1.00)
GFR calc non Af Amer: >60 mL/min (ref >60)
GLUCOSE: 117 mg/dL — AB (ref 65–99)
POTASSIUM: 3.9 mmol/L (ref 3.5–5.1)
Sodium: 139 mmol/L (ref 135–145)
TOTAL PROTEIN: 7.6 g/dL (ref 6.5–8.1)

## 2016-02-20 MED ORDER — DENOSUMAB 60 MG/ML ~~LOC~~ SOLN
60.0000 mg | Freq: Once | SUBCUTANEOUS | Status: AC
Start: 2016-02-20 — End: 2016-02-20
  Administered 2016-02-20: 60 mg via SUBCUTANEOUS
  Filled 2016-02-20: qty 1

## 2016-02-20 NOTE — Progress Notes (Signed)
Lydia Stevens presents today for injection per MD orders. Prolia 60mg administered SQ in Abdomen. Administration without incident. Patient tolerated well.

## 2016-02-20 NOTE — Progress Notes (Signed)
Lydia Blackbird, MD [address] [place] [address]  Right breast invasive ductal carcinoma (with associated DCIS) status post mastectomy 07/27/2014 followed by reconstruction with TRAM flap  2 cm tumor with intermediate grade DCIS, lymphovascular invasion noted, 2 SLN negative, grade 3, T1 C. N0 M0 stage IA  ER+ (100%), PR+ (71%), Her-2 negative  Core needle biopsy of L breast lesion c/w fibroadenoma  Oncotype DX score 24, 16% risk of recurrence    Breast cancer of lower-inner quadrant of right female breast (Lydia Stevens)   06/07/2014 Initial Biopsy Right breast needle biopsy 5:00 position: Invasive ductal carcinoma with DCIS, ER 100%, PR 71%, Ki-67 33%, HER-2 negative ratio 1.03   06/17/2014 Breast MRI Right breast: 10 x 7 x 5 mm biopsy-proven IDC with DCIS, left breast 7 x 7 x 7 mm fibroadenoma, left upper quadrant of the abdomen abutting the peritoneum 3 x 1.1 cm oval soft tissue mass   07/27/2014 Surgery Right breast mastectomy: Invasive ductal carcinoma grade 3, 2 cm, intermediate grade DCIS, lymphovascular invasion identified, 2 SLN negative, T1 C. N0 M0 stage IA, ER positive, PR 7%, HER-2 negative, Ki-67 33%   07/27/2014 Oncotype testing Recurrence Score of 24, placing patient in the intermediate risk group   09/06/2014 -  Chemotherapy Taxotere/Cytoxan    09/08/2014 Adverse Reaction Nasuea and voming three times x 2 days.  Added Aloxi and Emend to anti-emetic regimen.   10/18/2014 -  Chemotherapy Zoladex   10/27/2014 Adverse Reaction Hives, secondary to Zoladex?   02/16/2015 Surgery Laparoscopic BSO with Dr. Alycia Rossetti     CURRENT THERAPY: Arimidex  INTERVAL HISTORY: Lydia Stevens comes in today for follow-up of a right breast cancer, ER positive, PR positive and HER-2 negative. She continues on Arimidex, calcium, vitamin D and prolia.   Lydia Stevens returns to the [place] today accompanied by her husband.  She says she's doing wonderful. Her husband also says "she's  wonderful." She notes that her hot flashes are almost gone, saying "they're going away." she asks "is that normal?"   Her back pain is gone. She started walking with her sister and notes that "it's like it just went away."  When asked about her appetite, she says that "food is awesome" and notes with humor that this is bad and she would like to get rid of her appetite.  She notes "real bad tingling in her hands," saying that it might be located more in her fingers. She feels like it is the same whether it's happening at night or during the day. She finds herself flexing her fingers to try to alleviate it. She confirms that she can button her fingers, write, etc., and isn't having any trouble with it; just that she's experiencing this tingling in general. She confirms that it isn't pain. She notes that she types a lot at work and also wonders if it is related to that. She denies any neck pain or stiffness, no wrist or arm discomfort.  She denies any issues with her feet.  She says they just got back from [place] for her birthday, and that her husband bought her a sports car for their anniversary. They are also going on a cruise to [place], [place], in September, for her son's 108st birthday. They have a family reunion in August.  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  During the physical exam, she denies any trouble. She confirms  that she still takes calcium and vitamin D.   MEDICAL HISTORY: Past Medical History  Diagnosis Date  . Scoliosis   . Breast cancer ([place])     2015  . Anemia     before   . Hives Mar 3rd 2016  . GERD (gastroesophageal reflux disease)   . History of breast cancer 12/29/2014  . Blood transfusion without reported diagnosis   . Hyperlipidemia   . Osteoporosis 08/18/2015    has Breast cancer of lower-inner quadrant of right female breast ([place]); Genetic testing; Acquired absence of breast and nipple; History of  breast cancer; Situational depression; Scoliosis of lumbar spine; Vitamin D deficiency; and Osteopenia determined by x-ray on her problem list.    Breast cancer risk profile:  She menarched at age of 21  She had one pregnancy, her first child was born at age 20  She is currently on Depo Provera shots for unknown time in the [place] She was never exposed to fertility medications or hormone replacement therapy.  She has family history of Breast/GYN/GI cancer; 2 half-sisters with breast cancer    is allergic to oxycodone.  We administered denosumab.  SURGICAL HISTORY: Past Surgical History  Procedure Laterality Date  . Cesarean section  1996  . Wisdom tooth extraction    . Mastectomy w/ sentinel node biopsy Right 07/27/2014    Procedure: RIGHT MASTECTOMY WITH RIGHT AXILLARY SENTINEL LYMPH NODE BIOPSY;  Surgeon: Alphonsa Overall, MD;  Location: [facility];  Service: General;  Laterality: Right;  . Latissimus flap to breast Right 07/27/2014    Procedure: TRAM FLAP RECONSTUCTION RIGHT CHEST;  Surgeon: Irene Limbo, MD;  Location: [facility];  Service: Plastics;  Laterality: Right;  . Portacath placement Left 09/05/2013  . Breast reduction surgery Left 12/06/2014    Procedure: LEFT BREAST REDUCTION FOR ASYMMETRY;  Surgeon: Irene Limbo, MD;  Location: [facility];  Service: Plastics;  Laterality: Left;  . Breast reconstruction Right 12/06/2014    Procedure: RIGHT NIPPLE AREOLAR RECONSTRUCTION WITH FULL THICKNESS SKIN GRAFT FROM RIGHT THIGH;  Surgeon: Irene Limbo, MD;  Location: [facility];  Service: Plastics;  Laterality: Right;  . Port-a-cath removal Left 12/06/2014    Procedure: REMOVAL PORT-A-CATH;  Surgeon: Irene Limbo, MD;  Location: [facility];  Service: Plastics;  Laterality: Left;  . Oophorectomy  2016    SOCIAL HISTORY: Social History   Social History  . Marital Status: Married    Spouse Name: N/A  . Number of Children: 1    . Years of Education: N/A   Occupational History  . Not on file.   Social History Main Topics  . Smoking status: Never Smoker   . Smokeless tobacco: Never Used  . Alcohol Use: No  . Drug Use: No  . Sexual Activity: Yes    Birth Control/ Protection: None   Other Topics Concern  . Not on file   Social History Narrative    FAMILY HISTORY: Family History  Problem Relation Age of Onset  . Breast cancer Paternal Aunt 62  . Heart attack Father   . Arthritis Father   . Heart disease Father     Massive MI  . Breast cancer Maternal Aunt 44  . Breast cancer Sister 47  . Cancer Sister   . Breast cancer Maternal Aunt 59  . Arthritis Mother   . Hypertension Mother   . Asthma Sister   . Alcohol abuse Brother   . Hypertension Brother   . Cancer  Sister     1/2 - breast cancer    Review of Systems  Constitutional: Positive for hot flashes, but markedly improved. Negative for fever, chills, weight loss and malaise/fatigue.  HENT: Negative for congestion, hearing loss, nosebleeds, sore throat and tinnitus.   Eyes: Negative for blurred vision, double vision, pain and discharge.  Respiratory: Negative for cough, hemoptysis, sputum production, shortness of breath and wheezing.   Cardiovascular: Negative for chest pain, palpitations, claudication, leg swelling and PND.  Gastrointestinal: Negative for heartburn, vomiting, abdominal pain, diarrhea, constipation, blood in stool and melena.  Genitourinary: Negative for dysuria, urgency, frequency and hematuria.  Musculoskeletal:  Negative for joint pain and falls.  Skin: Negative for itching and rash.  Neurological: Negative for dizziness, tingling, tremors, sensory change, speech change, focal weakness, seizures, loss of consciousness, and weakness Endo/Heme/Allergies: Does not bruise/bleed easily.  Psychiatric/Behavioral: Negative for depression, suicidal ideas, memory loss and substance abuse. The patient is not nervous/anxious and does  not have insomnia.    14 point review of systems was performed and is negative except as detailed under history of present illness and above    PHYSICAL EXAMINATION  ECOG PERFORMANCE STATUS: 0 - Asymptomatic  Filed Vitals:   02/20/16 1120  BP: 111/66  Pulse: 83  Temp: 98.9 F (37.2 C)  Resp: 18    Physical Exam  Constitutional: She is oriented to person, place, and time and well-developed, well-nourished, and in no distress. Well groomed. Excellent Mood HENT:  Head: Normocephalic and atraumatic.  Nose: Nose normal.  Mouth/Throat: Oropharynx is clear and moist. No oropharyngeal exudate.  Eyes: Conjunctivae and EOM are normal. Pupils are equal, round, and reactive to light. Right eye exhibits no discharge. Left eye exhibits no discharge. No scleral icterus.  Neck: Normal range of motion. Neck supple. No tracheal deviation present. No thyromegaly present.  Cardiovascular: Normal rate, regular rhythm and normal heart sounds.  Exam reveals no gallop and no friction rub.   No murmur heard. Pulmonary/Chest: Effort normal and breath sounds normal. She has no wheezes. She has no rales.  Abdominal: Soft. Bowel sounds are normal. She exhibits no distension and no mass. There is no tenderness. There is no rebound and no guarding. Well healed surgical incision sites. Scar tissue around her abdominal surgical sites. Can palpate tram flat in upper abdomen Musculoskeletal: Normal range of motion. She exhibits no edema.  Lymphadenopathy:    She has no cervical adenopathy.  Neurological: She is alert and oriented to person, place, and time. She has normal reflexes. No cranial nerve deficit. Gait normal. Coordination normal.  Skin: Skin is warm and dry. No rash noted.  Psychiatric: Mood, memory, affect and judgment normal.  Nursing note and vitals reviewed.  LABORATORY DATA: I have reviewed the data as listed  CBC    Component Value Date/Time   WBC 8.0 08/04/2015 0957   WBC 7.6 09/05/2014  0914   RBC 4.24 08/04/2015 0957   RBC 4.17 09/05/2014 0914   HGB 12.1 08/04/2015 0957   HGB 11.7 09/05/2014 0914   HCT 35.9* 08/04/2015 0957   HCT 35.4 09/05/2014 0914   PLT 242 08/04/2015 0957   PLT 297 09/05/2014 0914   MCV 84.7 08/04/2015 0957   MCV 84.9 09/05/2014 0914   MCH 28.5 08/04/2015 0957   MCH 28.1 09/05/2014 0914   MCHC 33.7 08/04/2015 0957   MCHC 33.1 09/05/2014 0914   RDW 14.6 08/04/2015 0957   RDW 14.9* 09/05/2014 0914   LYMPHSABS 1.6 08/04/2015 0957  LYMPHSABS 1.6 09/05/2014 0914   MONOABS 0.5 08/04/2015 0957   MONOABS 0.5 09/05/2014 0914   EOSABS 0.1 08/04/2015 0957   EOSABS 0.2 09/05/2014 0914   BASOSABS 0.0 08/04/2015 0957   BASOSABS 0.0 09/05/2014 0914    Results for KUSHI, KUN (MRN [id number]) as of 02/20/2016 11:47  Ref. Range 02/20/2016 10:27  Sodium Latest Ref Range: 135-145 mmol/L 139  Potassium Latest Ref Range: 3.5-5.1 mmol/L 3.9  Chloride Latest Ref Range: 101-111 mmol/L 103  CO2 Latest Ref Range: 22-32 mmol/L 29  BUN Latest Ref Range: 6-20 mg/dL 14  Creatinine Latest Ref Range: 0.44-1.00 mg/dL 0.85  Calcium Latest Ref Range: 8.9-10.3 mg/dL 9.9  EGFR (Non-African Amer.) Latest Ref Range: >60 mL/min >60  EGFR (African American) Latest Ref Range: >60 mL/min >60  Glucose Latest Ref Range: 65-99 mg/dL 117 (H)  Anion gap Latest Ref Range: 5-15  7  Alkaline Phosphatase Latest Ref Range: 38-126 U/L 66  Albumin Latest Ref Range: 3.5-5.0 g/dL 4.1  AST Latest Ref Range: 15-41 U/L 16  ALT Latest Ref Range: 14-54 U/L 13 (L)  Total Protein Latest Ref Range: 6.5-8.1 g/dL 7.6  Total Bilirubin Latest Ref Range: 0.3-1.2 mg/dL 0.6    IMAGING:  EXAM: DUAL X-RAY ABSORPTIOMETRY (DXA) FOR BONE MINERAL DENSITY  IMPRESSION: Ordering Physician: Dr. Patrici Ranks,  Your patient Keaja Reaume completed a BMD test on 06/28/2015 using the [place] (software version: 14.10) manufactured by [employer]. The  following summarizes the results of our evaluation. PATIENT BIOGRAPHICAL: Name: Marjie, Chea Patient ID: [id number] Birth Date: Jan 19, 1971 Height: 68.0 in. Gender: Female Exam Date: 06/28/2015 Weight: 185.0 lbs. Indications: Hx Breast Ca, Low Calcium Intake, Partial Hysterectomy, Post Menopausal Fractures: Treatments: Anastrozole, Vitamin D DENSITOMETRY RESULTS: Site Region Measured Date Measured Age WHO Classification Young Adult T-score BMD %Change vs. Previous Significant Change (*) DualFemur Neck Left 06/28/2015 43.4 Osteopenia -1.2 0.868 g/cm2 2.1% - DualFemur Neck Left 01/26/2004 32.0 Osteopenia -1.4 0.850 g/cm2 - -  Right Forearm Radius 33% 06/28/2015 43.4 Normal 0.9 0.778 g/cm2 9.1% Yes Right Forearm Radius 33% 01/26/2004 32.0 Normal 0.0 0.713 g/cm2 - - ASSESSMENT: BMD as determined from Femur Neck Left is 0.868 g/cm2 with a T-Score of -1.2. This patient is considered osteopenic according to [facility]) criteria. Compared with the prior study on 01/26/2004, the BMD of the left femoral neck shows no statistically significant change. Compared with the prior study on 01/26/2004, the BMD of the right forearm shows a statistically significant increase. (Lumbar spine was not utilized due to advanced degenerative changes.)  World Pharmacologist (WHO) criteria for post-menopausal, Caucasian Women: Normal: T-score at or above -1 SD Osteopenia: T-score between -1 and -2.5 SD Osteoporosis: T-score at or below -2.5 SD  RECOMMENDATIONS: [place] recommends that FDA-approved medial therapies be considered in postmenopausal women and men age 52 or older with a: 1. Hip or vertebral (clinical or morphometric) fracture. 2. T-Score of < -2.5 at the spine or hip. 3. Ten-year fracture probability by FRAX of 3% or greater for hip fracture or 20% or greater for major osteoporotic fracture.  All  treatment decisions require clinical judgment and consideration of individual patient factors, including patient preferences, co-morbidities, previous drug use, risk factors not captured in the FRAX model (e.g. falls, vitamin D deficiency, increased bone turnover, interval significant decline in bone density) and possible under-or over-estimation of fracture risk by FRAX.  All patients should ensure an adequate intake of dietary calcium (1200  mg/d) and vitamin D (800 IU daily) unless contraindicated.  FOLLOW-UP: People with diagnosed cases of osteoporosis or osteopenia should be regularly tested for bone mineral density. For patients eligible for Medicare, routine testing is allowed once every 2 years. Testing frequency can be increased for patients who have rapidly progressing disease, or for those who are receiving medical therapy to restore bone mass.  I have reviewed this report, and agree with the above findings.  [facility] Your patient NAKIESHA RUMSEY completed a FRAX assessment on 06/28/2015 using the [place] (analysis version: 14.10) manufactured by [employer]. The following summarizes the results of our evaluation.  PATIENT BIOGRAPHICAL: Name: Jeraldean, Wechter Patient ID: [id number] Birth Date: 1970/11/19 Height: 68.0 in. Gender: Female [age] Weight: 185.0 lbs. Ethnicity: Black Exam Date: 06/28/2015  FRAX* RESULTS: (version: 3.5) 10-year Probability of Fracture1 Major Osteoporotic Fracture2 Hip Fracture 1.2% 0.1% Population: [place] (Black) Risk Factors: None  Based on DualFemur (Left) Neck BMD  1 -The 10-year probability of fracture may be lower than reported if the patient has received treatment. 2 -Major Osteoporotic Fracture: Clinical Spine, Forearm, Hip or Shoulder  *FRAX is a Materials engineer of the [employer] for  Metabolic Bone Disease, a [place] (WHO) [facility].  ASSESSMENT: The probability of a major osteoporotic fracture is 1.2% within the next ten years.  The probability of a hip fracture is 0.1% within the next ten years.   Electronically Signed  By: David Martinique M.D.  On: 06/28/2015 10:28   ASSESSMENT and THERAPY PLAN:  Stage IA ER positive, PR positive, HER-2 negative carcinoma of the right breast Mastectomy followed by TRAM flap Significant allergic reaction to Zoladex with facial swelling and hives, required treatment with steroids and Benadryl over a two-week period Mild nausea with tamoxifen Laparoscopic BSO Dr. Alycia Rossetti at [facility] 06/28/2015 with osteopenia, on Prolia 07/2015 Aromasin started 06/2015 Vitamin D deficiency  She is doing well and is to continue on calcium, vitamin D and arimidex. She is currently on vitamin D 2000 IU daily. She started prolia in December for osteopenia while on her AI therapy. She is due for prolia today. She has excellent dental care.   She did not need any prescription refills.  She will have a follow up in 4 months. She will be due for breast exam and mammogram in October.    Orders Placed This Encounter  Procedures  . CBC with Differential    Standing Status: Future     Number of Occurrences:      Standing Expiration Date: 02/19/2017  . Comprehensive metabolic panel    Standing Status: Future     Number of Occurrences:      Standing Expiration Date: 02/19/2017  . Vitamin D 25 hydroxy    Standing Status: Future     Number of Occurrences:      Standing Expiration Date: 02/19/2017  . Comprehensive metabolic panel    Standing Status: Future     Number of Occurrences:      Standing Expiration Date: 02/19/2017    Order Specific Question:  Has the patient fasted?    Answer:  No  . SCHEDULING COMMUNICATION    Schedule 15 minute injection appointment.  Cannot select a 6 month frequency so will want to set  up another visit in 6 months.   All questions were answered. The patient knows to call the clinic with any problems, questions or concerns. We can certainly see the  patient much sooner if necessary.  This document serves as a record of services personally performed by Ancil Linsey, MD. It was created on her behalf by Toni Amend, a trained medical scribe. The creation of this record is based on the scribe's personal observations and the provider's statements to them. This document has been checked and approved by the attending provider.     I have reviewed the above documentation for accuracy and completeness, and I agree with the above. Molli Hazard, MD

## 2016-02-20 NOTE — Progress Notes (Signed)
Please see doctors encounter for more information 

## 2016-02-20 NOTE — Patient Instructions (Signed)
[facility] at [facility] Discharge Instructions  RECOMMENDATIONS MADE BY THE CONSULTANT AND ANY TEST RESULTS WILL BE SENT TO YOUR REFERRING PHYSICIAN.  Return in 4 months to see Dr. Whitney Muse with labs  You will receive Prolia today. Then in 6 months.  Continue taking your Calcium and Vitamin D  Breast Exam/Mammogram in 4 months  Thank you for choosing [facility] at [facility] to provide your oncology and hematology care.  To afford each patient quality time with our provider, please arrive at least 15 minutes before your scheduled appointment time.   Beginning January 23rd 2017 lab work for the [facility] will be done in the  Main lab at [facility] on 1st floor. If you have a lab appointment with the [facility] please come in thru the  Main Entrance and check in at the main information desk  You need to re-schedule your appointment should you arrive 10 or more minutes late.  We strive to give you quality time with our providers, and arriving late affects you and other patients whose appointments are after yours.  Also, if you no show three or more times for appointments you may be dismissed from the clinic at the providers discretion.     Again, thank you for choosing [facility].  Our hope is that these requests will decrease the amount of time that you wait before being seen by our physicians.       _____________________________________________________________  Should you have questions after your visit to [facility], please contact our office at [phone] between the hours of 8:30 a.m. and 4:30 p.m.  Voicemails left after 4:30 p.m. will not be returned until the following business day.  For prescription refill requests, have your pharmacy contact our office.         Resources For Cancer Patients and their Caregivers ? American Cancer Society: Can assist with transportation, wigs, general needs,  runs Look Good Feel Better.        435-808-9890 ? Cancer Care: Provides financial assistance, online support groups, medication/co-pay assistance.  1-800-813-HOPE (318) 156-2248) ? [place] Assists [place] cancer patients and their families through emotional , educational and financial support.  (830)469-5952 ? [place] DSS Where to apply for food stamps, Medicaid and utility assistance. 208-528-9146 ? [facility]: Transportation to medical appointments. 682-450-6114 ? Social Security Administration: May apply for disability if have a Stage IV cancer. 832-665-9853 434-188-2989 ? [employer], Disability and Transit Services: Assists with nutrition, care and transit needs. [facility] Support Programs: @10RELATIVEDAYS @ > Cancer Support Group  2nd Tuesday of the month 1pm-2pm, Journey Room  > Creative Journey  3rd Tuesday of the month 1130am-1pm, Journey Room  > Look Good Feel Better  1st Wednesday of the month 10am-12 noon, Journey Room (Call [facility] to register [phone])

## 2016-03-13 ENCOUNTER — Other Ambulatory Visit (HOSPITAL_COMMUNITY): Payer: Self-pay | Admitting: Hematology & Oncology

## 2016-03-27 ENCOUNTER — Other Ambulatory Visit (HOSPITAL_COMMUNITY): Payer: Self-pay | Admitting: Hematology & Oncology

## 2016-04-12 IMAGING — CT CT MAXILLOFACIAL W/O CM
3 series · 16 of 47 positions shown, 19 images · non-contrast
Comparison: 07/06/2014.

CLINICAL DATA: Abnormal bone scan with increased radiotracer uptake
over the RIGHT mandible. Subsequent encounter.

EXAM:
CT MAXILLOFACIAL WITHOUT CONTRAST
TECHNIQUE: Multidetector CT imaging of the maxillofacial structures was
performed. Multiplanar CT image reconstructions were also generated.
A small metallic BB was placed on the right temple in order to
reliably differentiate right from left.

[Series 3: maxillofacial 2.0 h32s · axial · 0.35mm/px · z∈[+1184,+1336]mm · 10 of 90 slices shown, 13 images]
[im 7/90  brain]
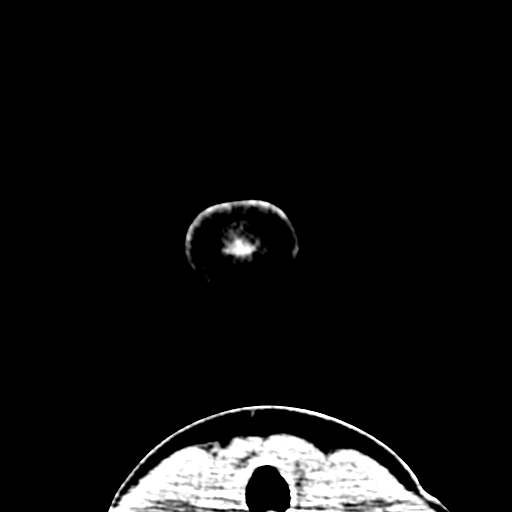
[im 7/90  bone]
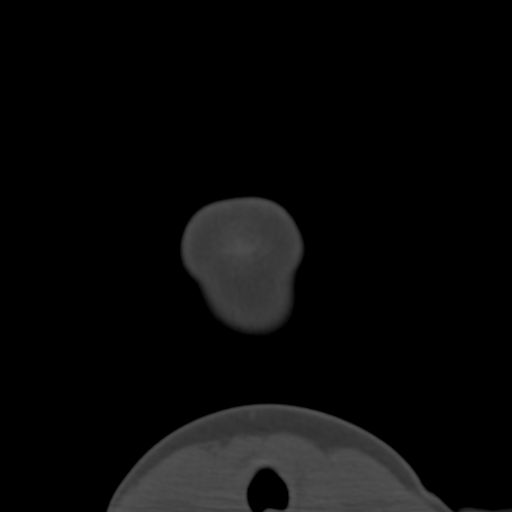
[im 16/90  bone]
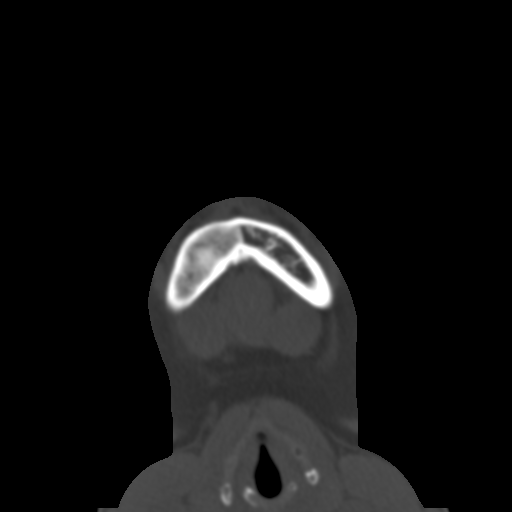
[im 25/90  bone]
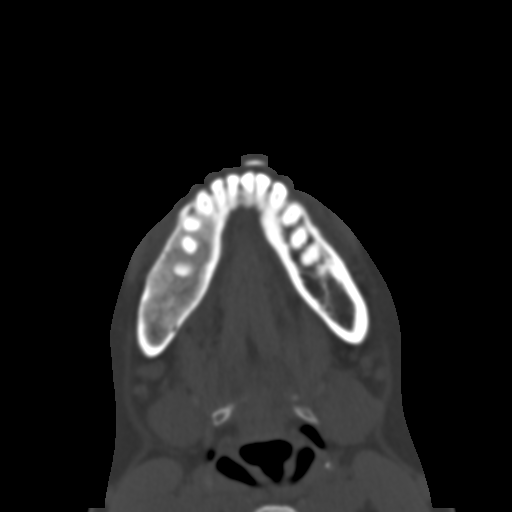
[im 31/90  bone]
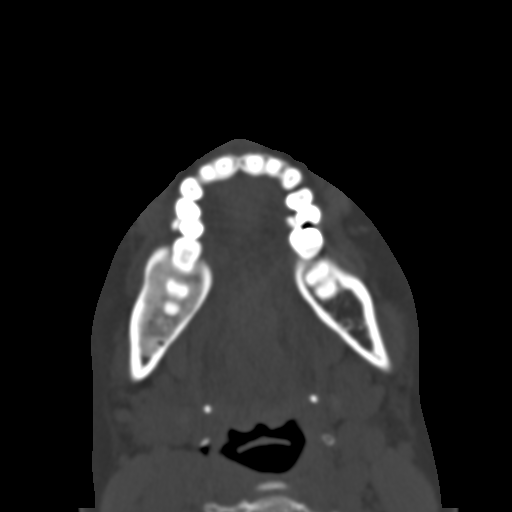
[im 40/90  brain]
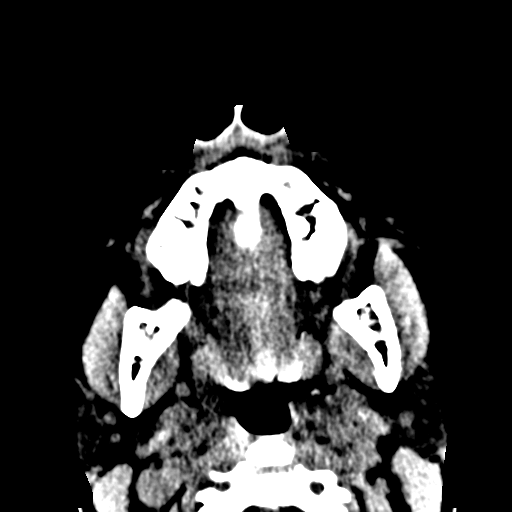
[im 40/90  bone]
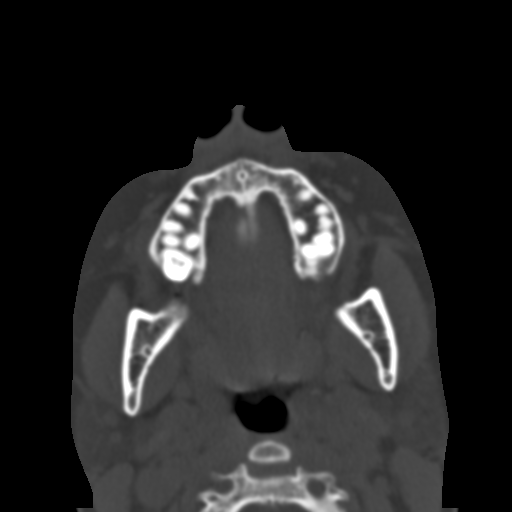
[im 50/90  bone]
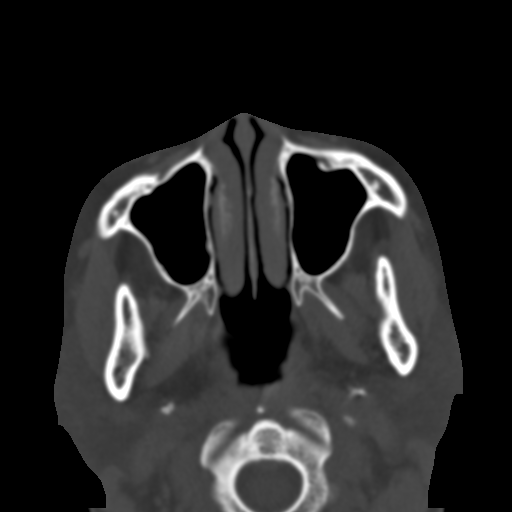
[im 59/90  bone]
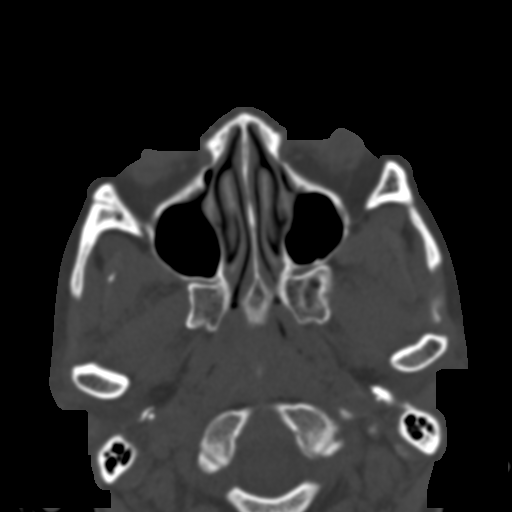
[im 68/90  bone]
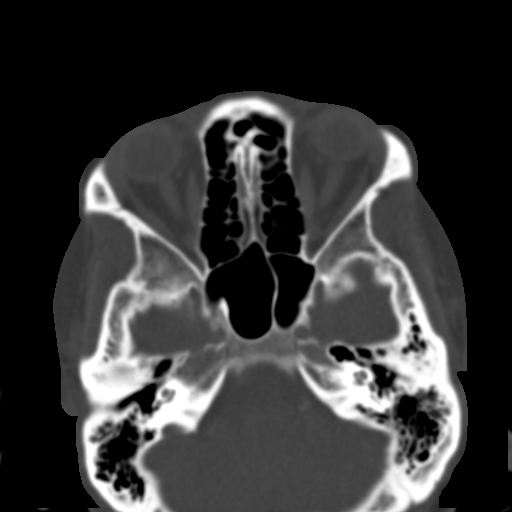
[im 74/90  brain]
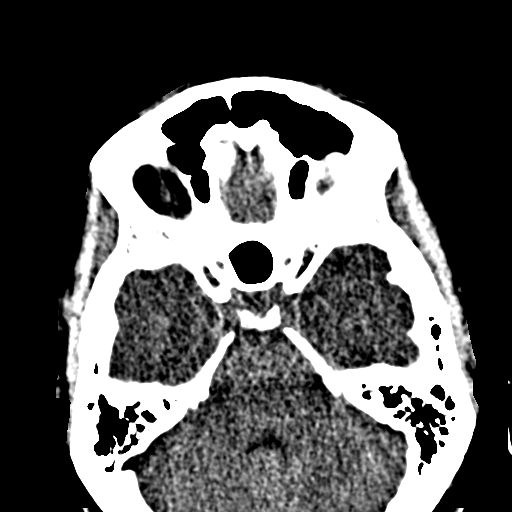
[im 74/90  bone]
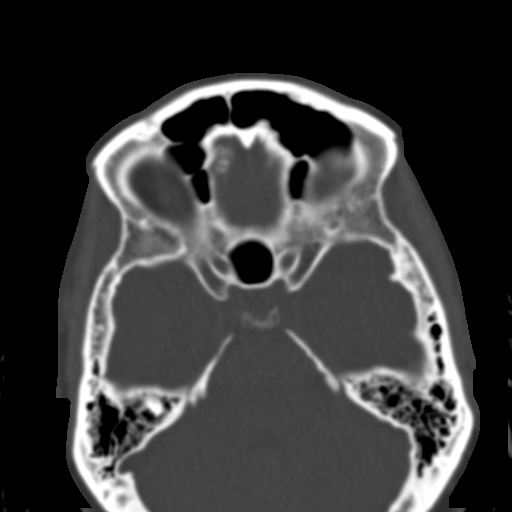
[im 83/90  bone]
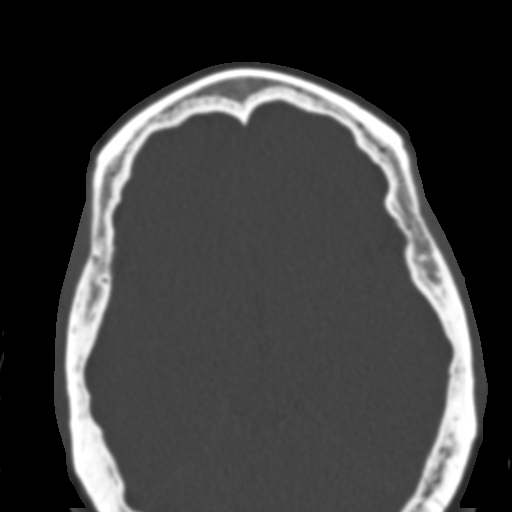

[Series 602: <mpr thick range> · coronal · 0.35mm/px · 3 of 80 slices shown]
[im 27/80  bone]
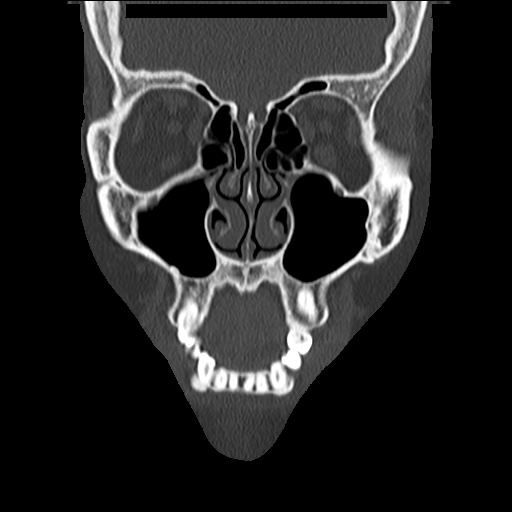
[im 36/80  bone]
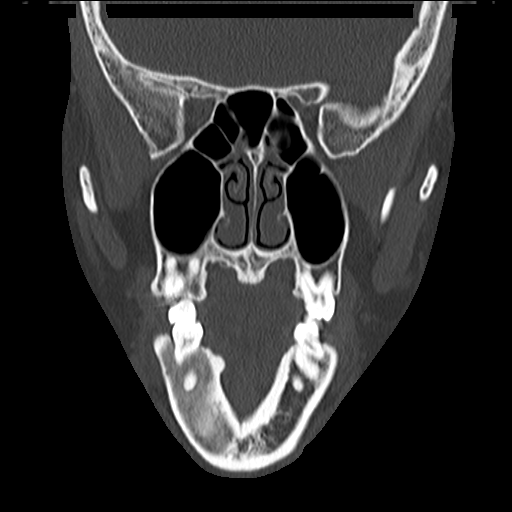
[im 44/80  bone]
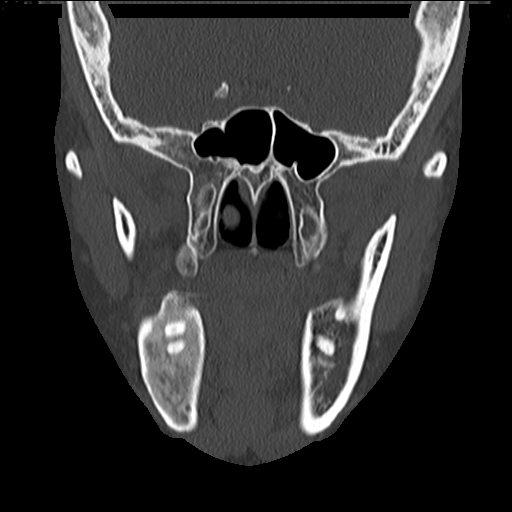

[Series 603: <mpr thick range(1)> · sagittal · 0.35mm/px · 3 of 84 slices shown]
[im 28/84  bone]
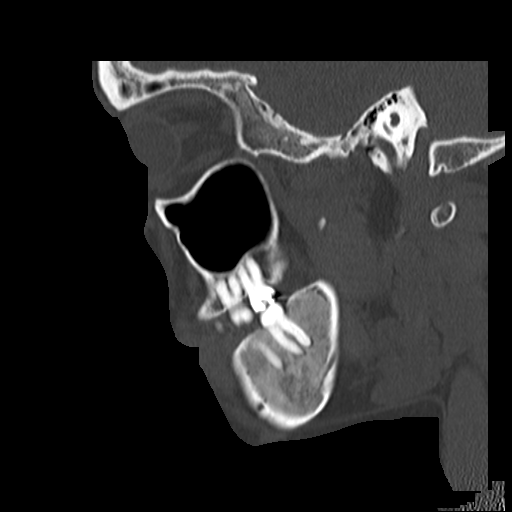
[im 42/84  bone]
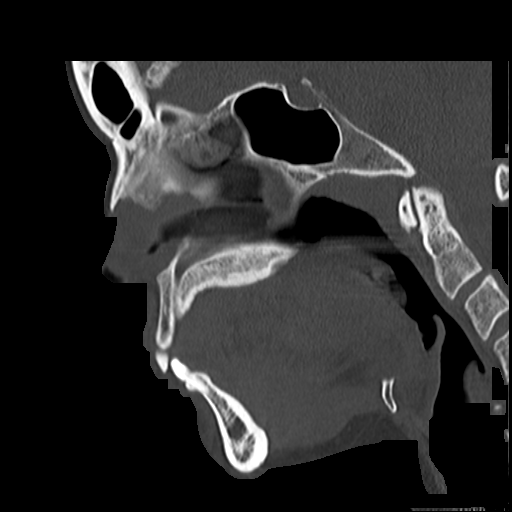
[im 56/84  bone]
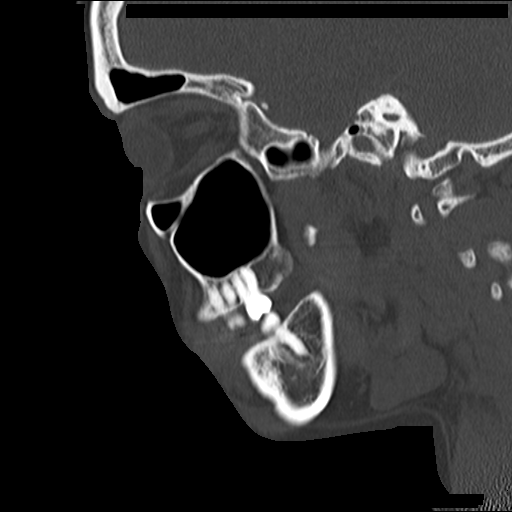

[16 of 47 positions shown; findings below may reference images not displayed]

FINDINGS: There is minimal expansion of the RIGHT mandible with replacement of
normal trabecular markings with diffuse ground-glass attenuation
bone. The appearance is compatible with fibrous dysplasia. The
involvement extends from the lateral RIGHT mandibular incisor nearly
to the angle of the mandible. The involvement is isolated.

The paranasal sinuses are within normal limits. Intracranially,
there is a calcified mass in the inferior RIGHT frontal lobe in the
anterior cranial fossa the measures 25 mm AP x 13 mm transverse.
IMPRESSION: 1. Abnormal radiotracer uptake in the RIGHT mandible is due to
isolated fibrous dysplasia of the RIGHT mandible.
2. 25 mm x 13 mm calcified mass in the inferior RIGHT frontal lobe
likely represents calcified planum sphenoidale meningioma. Follow-up
MRI of the brain with and without Contrast recommended for further
assessment. No head CTs are available for comparison.

## 2016-04-14 ENCOUNTER — Other Ambulatory Visit (HOSPITAL_COMMUNITY): Payer: Self-pay | Admitting: Hematology & Oncology

## 2016-04-17 ENCOUNTER — Other Ambulatory Visit (HOSPITAL_COMMUNITY): Payer: Self-pay | Admitting: Hematology & Oncology

## 2016-05-14 ENCOUNTER — Telehealth: Payer: Self-pay | Admitting: Family Medicine

## 2016-05-14 NOTE — Telephone Encounter (Signed)
Patient was seen on 05/13/16 with DX Z90.11 and Z85.3 I called and spoke with Quita Skye at [facility]. He did finally help with the prior auth. 1st day I spoke with him he said he couldn't do it until the day after the patient was seen. I called him back at [phone] ext. [phone]  Auth# [id number] Valid from 05/13/16-3/182018 with 6 visits.  I called and gave information to [place] atDr. Para Skeans office.

## 2016-06-09 ENCOUNTER — Other Ambulatory Visit (HOSPITAL_COMMUNITY): Payer: Self-pay | Admitting: Hematology & Oncology

## 2016-06-19 ENCOUNTER — Ambulatory Visit (HOSPITAL_COMMUNITY)
Admission: RE | Admit: 2016-06-19 | Discharge: 2016-06-19 | Disposition: A | Discharge status: Home / Self Care | Payer: 59 | Source: Ambulatory Visit | Attending: Hematology & Oncology | Admitting: Hematology & Oncology

## 2016-06-19 ENCOUNTER — Other Ambulatory Visit (HOSPITAL_COMMUNITY): Payer: Self-pay | Admitting: Hematology & Oncology

## 2016-06-19 ENCOUNTER — Encounter (HOSPITAL_COMMUNITY): Payer: Self-pay

## 2016-06-19 DIAGNOSIS — Z1231 Encounter for screening mammogram for malignant neoplasm of breast: Secondary | ICD-10-CM | POA: Diagnosis present

## 2016-06-19 DIAGNOSIS — R928 Other abnormal and inconclusive findings on diagnostic imaging of breast: Secondary | ICD-10-CM | POA: Insufficient documentation

## 2016-06-19 DIAGNOSIS — Z0000 Encounter for general adult medical examination without abnormal findings: Secondary | ICD-10-CM

## 2016-06-19 DIAGNOSIS — Z139 Encounter for screening, unspecified: Secondary | ICD-10-CM

## 2016-06-19 DIAGNOSIS — C50311 Malignant neoplasm of lower-inner quadrant of right female breast: Secondary | ICD-10-CM

## 2016-06-25 ENCOUNTER — Encounter (HOSPITAL_COMMUNITY): Payer: 59

## 2016-06-25 ENCOUNTER — Encounter (HOSPITAL_COMMUNITY): Payer: 59 | Attending: Hematology & Oncology | Admitting: Hematology & Oncology

## 2016-06-25 ENCOUNTER — Telehealth: Payer: Self-pay | Admitting: Family Medicine

## 2016-06-25 ENCOUNTER — Encounter (HOSPITAL_COMMUNITY): Payer: Self-pay | Admitting: Hematology & Oncology

## 2016-06-25 VITALS — BP 89/55 | HR 69 | Temp 98.5°F | Resp 16 | Wt 189.1 lb

## 2016-06-25 DIAGNOSIS — Z79811 Long term (current) use of aromatase inhibitors: Secondary | ICD-10-CM

## 2016-06-25 DIAGNOSIS — I959 Hypotension, unspecified: Secondary | ICD-10-CM

## 2016-06-25 DIAGNOSIS — C50311 Malignant neoplasm of lower-inner quadrant of right female breast: Secondary | ICD-10-CM | POA: Insufficient documentation

## 2016-06-25 DIAGNOSIS — M8580 Other specified disorders of bone density and structure, unspecified site: Secondary | ICD-10-CM | POA: Diagnosis not present

## 2016-06-25 DIAGNOSIS — E559 Vitamin D deficiency, unspecified: Secondary | ICD-10-CM | POA: Diagnosis not present

## 2016-06-25 DIAGNOSIS — R928 Other abnormal and inconclusive findings on diagnostic imaging of breast: Secondary | ICD-10-CM

## 2016-06-25 DIAGNOSIS — I9589 Other hypotension: Secondary | ICD-10-CM

## 2016-06-25 LAB — COMPREHENSIVE METABOLIC PANEL
ALBUMIN: 4.2 g/dL (ref 3.5–5.0)
ALT: 11 U/L — ABNORMAL LOW (ref 14–54)
AST: 16 U/L (ref 15–41)
Alkaline Phosphatase: 64 U/L (ref 38–126)
Anion gap: 7 (ref 5–15)
BILIRUBIN TOTAL: 0.7 mg/dL (ref 0.3–1.2)
BUN: 12 mg/dL (ref 6–20)
CO2: 27 mmol/L (ref 22–32)
CREATININE: 0.79 mg/dL (ref 0.44–1.00)
Calcium: 9.2 mg/dL (ref 8.9–10.3)
Chloride: 104 mmol/L (ref 101–111)
GFR calc Af Amer: >60 mL/min (ref >60)
GLUCOSE: 83 mg/dL (ref 65–99)
Potassium: 3.7 mmol/L (ref 3.5–5.1)
Sodium: 138 mmol/L (ref 135–145)
Total Protein: 8 g/dL (ref 6.5–8.1)

## 2016-06-25 LAB — CBC WITH DIFFERENTIAL/PLATELET
BASOS ABS: 0 10*3/uL (ref 0.0–0.1)
BASOS PCT: 0 %
Eosinophils Absolute: 0.4 10*3/uL (ref 0.0–0.7)
Eosinophils Relative: 3 %
HEMATOCRIT: 38.3 % (ref 36.0–46.0)
Hemoglobin: 12.8 g/dL (ref 12.0–15.0)
LYMPHS PCT: 26 %
Lymphs Abs: 3.1 10*3/uL (ref 0.7–4.0)
MCH: 28.1 pg (ref 26.0–34.0)
MCHC: 33.4 g/dL (ref 30.0–36.0)
MCV: 84 fL (ref 78.0–100.0)
Monocytes Absolute: 0.5 10*3/uL (ref 0.1–1.0)
Monocytes Relative: 4 %
NEUTROS ABS: 7.9 10*3/uL — AB (ref 1.7–7.7)
NEUTROS PCT: 67 %
Platelets: 270 10*3/uL (ref 150–400)
RBC: 4.56 MIL/uL (ref 3.87–5.11)
RDW: 15.9 % — ABNORMAL HIGH (ref 11.5–15.5)
WBC: 11.8 10*3/uL — AB (ref 4.0–10.5)

## 2016-06-25 NOTE — Telephone Encounter (Signed)
Pt at [place] for treatment of breast cancer with Dr Ancil Linsey, MD  Referral needed from [facility].  Rec'd Josem Kaufmann [id number] for 6 visits  Dx [id number]

## 2016-06-25 NOTE — Progress Notes (Signed)
[place]  CONSULT NOTE  Patient Care Team: Alycia Rossetti, MD as PCP - General (Family Medicine) Alphonsa Overall, MD as Consulting Physician (General Surgery) Nicholas Lose, MD as Consulting Physician (Hematology and Oncology) Gery Pray, MD as Consulting Physician (Radiation Oncology)  CHIEF COMPLAINTS/PURPOSE OF CONSULTATION:     Breast cancer of lower-inner quadrant of right female breast ([place])   06/07/2014 Initial Biopsy    Right breast needle biopsy 5:00 position: Invasive ductal carcinoma with DCIS, ER 100%, PR 71%, Ki-67 33%, HER-2 negative ratio 1.03      06/17/2014 Breast MRI    Right breast: 10 x 7 x 5 mm biopsy-proven IDC with DCIS, left breast 7 x 7 x 7 mm fibroadenoma, left upper quadrant of the abdomen abutting the peritoneum 3 x 1.1 cm oval soft tissue mass      07/27/2014 Surgery    Right breast mastectomy: Invasive ductal carcinoma grade 3, 2 cm, intermediate grade DCIS, lymphovascular invasion identified, 2 SLN negative, T1 C. N0 M0 stage IA, ER positive, PR 7%, HER-2 negative, Ki-67 33%      07/27/2014 Oncotype testing    Recurrence Score of 24, placing patient in the intermediate risk group      09/06/2014 -  Chemotherapy    Taxotere/Cytoxan       09/08/2014 Adverse Reaction    Nasuea and voming three times x 2 days.  Added Aloxi and Emend to anti-emetic regimen.      10/18/2014 -  Chemotherapy    Zoladex      10/27/2014 Adverse Reaction    Hives, secondary to Zoladex?      02/16/2015 Surgery    Laparoscopic BSO with Dr. Alycia Rossetti       HISTORY OF PRESENTING ILLNESS:  Lydia Stevens 45 y.o. female is here for a follow up of R breast cancer, ER+, PR + HER 2 -. She continues on AI therapy. No major complaints. She works full time. Mood is good.   She does not feel well today. She notes that her symptoms started on Sunday. She started having nausea. Vitals show mild hypotension. She denies fever or recent sick contacts. Ronni  states she has been keeping well hydrated.  Patient states she has had to keep in an upright position. If she lays down, she feels dizzy when she gets back up.  Patient has had a mammogram recently. She notes that she is being called back for further evaluation of L breast abnormality. This was similar to when she was first diagnosed. Currently she notes that "I am ok."  She denies urinary symptoms or cough.  No joint pain. Intermittent hot flashes.    MEDICAL HISTORY:  Past Medical History:  Diagnosis Date  . Anemia    before   . Blood transfusion without reported diagnosis   . Breast cancer ([place])    2015  . GERD (gastroesophageal reflux disease)   . History of breast cancer 12/29/2014  . Hives Mar 3rd 2016  . Hyperlipidemia   . Osteoporosis 08/18/2015  . Scoliosis     SURGICAL HISTORY: Past Surgical History:  Procedure Laterality Date  . BREAST RECONSTRUCTION Right 12/06/2014   Procedure: RIGHT NIPPLE AREOLAR RECONSTRUCTION WITH FULL THICKNESS SKIN GRAFT FROM RIGHT THIGH;  Surgeon: Irene Limbo, MD;  Location: [facility];  Service: Plastics;  Laterality: Right;  . BREAST REDUCTION SURGERY Left 12/06/2014   Procedure: LEFT BREAST REDUCTION FOR ASYMMETRY;  Surgeon: Irene Limbo, MD;  Location: [facility];  Service: Plastics;  Laterality: Left;  . CESAREAN SECTION  1996  . LATISSIMUS FLAP TO BREAST Right 07/27/2014   Procedure: TRAM FLAP RECONSTUCTION RIGHT CHEST;  Surgeon: Irene Limbo, MD;  Location: [facility];  Service: Plastics;  Laterality: Right;  . MASTECTOMY W/ SENTINEL NODE BIOPSY Right 07/27/2014   Procedure: RIGHT MASTECTOMY WITH RIGHT AXILLARY SENTINEL LYMPH NODE BIOPSY;  Surgeon: Alphonsa Overall, MD;  Location: [facility];  Service: General;  Laterality: Right;  . OOPHORECTOMY  2016  . PORT-A-CATH REMOVAL Left 12/06/2014   Procedure: REMOVAL PORT-A-CATH;  Surgeon: Irene Limbo, MD;  Location: [facility];  Service:  Plastics;  Laterality: Left;  . PORTACATH PLACEMENT Left 09/05/2013  . WISDOM TOOTH EXTRACTION      SOCIAL HISTORY: Social History   Social History  . Marital status: Married    Spouse name: N/A  . Number of children: 1  . Years of education: N/A   Occupational History  . Not on file.   Social History Main Topics  . Smoking status: Never Smoker  . Smokeless tobacco: Never Used  . Alcohol use No  . Drug use: No  . Sexual activity: Yes    Birth control/ protection: None   Other Topics Concern  . Not on file   Social History Narrative  . No narrative on file    FAMILY HISTORY: Family History  Problem Relation Age of Onset  . Heart attack Father   . Arthritis Father   . Heart disease Father     Massive MI  . Breast cancer Maternal Aunt 78  . Breast cancer Sister 14  . Cancer Sister   . Breast cancer Maternal Aunt 15  . Arthritis Mother   . Hypertension Mother   . Asthma Sister   . Alcohol abuse Brother   . Hypertension Brother   . Cancer Sister     1/2 - breast cancer  . Breast cancer Paternal Aunt 5    ALLERGIES:  is allergic to oxycodone.  MEDICATIONS:  Current Outpatient Prescriptions  Medication Sig Dispense Refill  . anastrozole (ARIMIDEX) 1 MG tablet TAKE 1 TABLET(1 MG) BY MOUTH DAILY 30 tablet 0  . BIOTIN PO Take 1 tablet by mouth daily.    . Calcium Carbonate-Vit D-Min (CALCIUM 1200 PO) Take by mouth.    . diazepam (VALIUM) 10 MG tablet Take 1 tablet (10 mg total) by mouth every 8 (eight) hours as needed (muscle spasm). 30 tablet 0  . traMADol (ULTRAM) 50 MG tablet Take 50 mg by mouth every 6 (six) hours as needed. Reported on 11/02/2015    . Vitamin D, Ergocalciferol, (DRISDOL) 50000 units CAPS capsule TAKE 1 CAPSULE BY MOUTH ONCE WEEKLY 12 capsule 0   No current facility-administered medications for this visit.    Facility-Administered Medications Ordered in Other Visits  Medication Dose Route Frequency Provider Last Rate Last Dose  .  diphenhydrAMINE (BENADRYL) injection 50 mg  50 mg Intravenous Once Patrici Ranks, MD        Review of Systems  Constitutional: Negative.        Dizziness  HENT: Negative.   Eyes: Negative.   Respiratory: Negative.   Cardiovascular: Negative.   Gastrointestinal: Positive for nausea.  Genitourinary: Negative.   Musculoskeletal: Negative.   Skin: Negative.   Neurological: Negative.   Endo/Heme/Allergies: Negative.   Psychiatric/Behavioral: Negative.   All other systems reviewed and are negative. 14 point ROS was done and is otherwise as detailed above or in  HPI   PHYSICAL EXAMINATION: ECOG PERFORMANCE STATUS: 1 - Symptomatic but completely ambulatory  Vitals:   06/25/16 1152  BP: (!) 89/55  Pulse: 69  Resp: 16  Temp: 98.5 F (36.9 C)   Filed Weights   06/25/16 1152  Weight: 189 lb 1.6 oz (85.8 kg)     Physical Exam  Constitutional: She is oriented to person, place, and time and well-developed, well-nourished, and in no distress.  HENT:  Head: Normocephalic and atraumatic.  Nose: Nose normal.  Mouth/Throat: Oropharynx is clear and moist. No oropharyngeal exudate.  Eyes: Conjunctivae and EOM are normal. Pupils are equal, round, and reactive to light. Right eye exhibits no discharge. Left eye exhibits no discharge. No scleral icterus.  Neck: Normal range of motion. Neck supple. No tracheal deviation present. No thyromegaly present.  Cardiovascular: Normal rate, regular rhythm and normal heart sounds.  Exam reveals no gallop and no friction rub.   No murmur heard. Pulmonary/Chest: Effort normal and breath sounds normal. She has no wheezes. She has no rales.  Abdominal: Soft. Bowel sounds are normal. She exhibits no distension and no mass. There is no tenderness. There is no rebound and no guarding.  Musculoskeletal: Normal range of motion. She exhibits no edema.  Lymphadenopathy:    She has no cervical adenopathy.  Neurological: She is alert and oriented to person,  place, and time. She has normal reflexes. No cranial nerve deficit. Gait normal. Coordination normal.  Skin: Skin is warm and dry. No rash noted.  Psychiatric: Mood, memory, affect and judgment normal.  Nursing note and vitals reviewed.   LABORATORY DATA:  I have reviewed the data as listed Lab Results  Component Value Date   WBC 11.8 (H) 06/25/2016   HGB 12.8 06/25/2016   HCT 38.3 06/25/2016   MCV 84.0 06/25/2016   PLT 270 06/25/2016   CMP     Component Value Date/Time   NA 138 06/25/2016 1036   NA 139 09/05/2014 0914   K 3.7 06/25/2016 1036   K 3.8 09/05/2014 0914   CL 104 06/25/2016 1036   CO2 27 06/25/2016 1036   CO2 29 09/05/2014 0914   GLUCOSE 83 06/25/2016 1036   GLUCOSE 88 09/05/2014 0914   BUN 12 06/25/2016 1036   BUN 6.0 (L) 09/05/2014 0914   CREATININE 0.79 06/25/2016 1036   CREATININE 0.76 01/19/2015 0910   CREATININE 0.8 09/05/2014 0914   CALCIUM 9.2 06/25/2016 1036   CALCIUM 9.3 09/05/2014 0914   PROT 8.0 06/25/2016 1036   PROT 6.6 09/05/2014 0914   ALBUMIN 4.2 06/25/2016 1036   ALBUMIN 3.4 (L) 09/05/2014 0914   AST 16 06/25/2016 1036   AST 16 09/05/2014 0914   ALT 11 (L) 06/25/2016 1036   ALT 9 09/05/2014 0914   ALKPHOS 64 06/25/2016 1036   ALKPHOS 83 09/05/2014 0914   BILITOT 0.7 06/25/2016 1036   BILITOT 0.62 09/05/2014 0914   GFRNONAA >60 06/25/2016 1036   GFRAA >60 06/25/2016 1036     RADIOGRAPHIC STUDIES: I have personally reviewed the radiological images as listed and agreed with the findings in the report. No results found.  CLINICAL DATA:  Screening. History of right mastectomy 2015.  EXAM: 2D DIGITAL SCREENING UNILATERAL LEFT MAMMOGRAM WITH CAD AND ADJUNCT TOMO  COMPARISON:  Previous exam(s).  ACR Breast Density Category c: The breast tissue is heterogeneously dense, which may obscure small masses.  FINDINGS: In the left breast, a possible mass warrants further evaluation. In the right breast, no findings suspicious for  malignancy.  Images were processed with CAD.  IMPRESSION: Further evaluation is suggested for possible mass in the left breast.  RECOMMENDATION: Diagnostic mammogram and possibly ultrasound of the left breast. (Code:[id number])  The patient will be contacted regarding the findings, and additional imaging will be scheduled.  BI-RADS CATEGORY  0: Incomplete. Need additional imaging evaluation and/or prior mammograms for comparison.   Electronically Signed   By: Nolon Nations M.D.   On: 06/25/2016 12:05  ASSESSMENT & PLAN:  Breast cancer of lower-inner quadrant of right female breast [facility])   Staging form: Breast, AJCC 7th Edition   - Clinical: Stage IA (T1b, N0, cM0) - Unsigned         Staging comments: Staged at breast conference 10.28.15 Mastectomy followed by TRAM flap Significant allergic reaction to Zoladex with facial swelling and hives, required treatment with steroids and Benadryl over a two-week period Mild nausea with tamoxifen Laparoscopic BSO Dr. Alycia Rossetti at [place] 06/28/2015 with osteopenia, on Prolia 07/2015 Aromasin started 06/2015 Vitamin D deficiency  Patient appears well aside from low blood pressure the last few days accompanied by nausea. Her white blood count is 11.8, I suspect a viral illness.. I told her that if she does not feel better by Thursday to give me a call and let me know. If she develops fever or vomiting she will also call.  Laboratory studies were reviewed with the patient. Results are noted above.   She has been called back for ultrasound and additional imaging of the L breast. These appointments have been arranged. Mammogram was reviewed with the patient, results are noted above.   Currently she is to continue on aromasin, calcium and vitamin D. She is on prolia for osteopenia.   RTC 4 months, sooner if needed.   ORDERS PLACED FOR THIS ENCOUNTER: Orders Placed This Encounter  Procedures  . CBC with Differential  .  Comprehensive metabolic panel  . VITAMIN D 25 Hydroxy (Vit-D Deficiency, Fractures)   This document serves as a record of services personally performed by Ancil Linsey, MD. It was created on her behalf by Elmyra Ricks, a trained medical scribe. The creation of this record is based on the scribe's personal observations and the provider's statements to them. This document has been checked and approved by the attending provider.  I have reviewed the above documentation for accuracy and completeness and I agree with the above.  This note was electronically signed.    Molli Hazard, MD  06/25/2016 2:30 PM

## 2016-06-25 NOTE — Patient Instructions (Signed)
[facility] at [facility] Discharge Instructions  RECOMMENDATIONS MADE BY THE CONSULTANT AND ANY TEST RESULTS WILL BE SENT TO YOUR REFERRING PHYSICIAN.  You saw Dr.Penland today. Follow up in 4 months with labs. See Amy at checkout for appointments.  Thank you for choosing [facility] at [facility] to provide your oncology and hematology care.  To afford each patient quality time with our provider, please arrive at least 15 minutes before your scheduled appointment time.   Beginning January 23rd 2017 lab work for the Cancer Center will be done in the  Main lab at [place] on 1st floor. If you have a lab appointment with the Cancer Center please come in thru the  Main Entrance and check in at the main information desk  You need to re-schedule your appointment should you arrive 10 or more minutes late.  We strive to give you quality time with our providers, and arriving late affects you and other patients whose appointments are after yours.  Also, if you no show three or more times for appointments you may be dismissed from the clinic at the providers discretion.     Again, thank you for choosing [facility].  Our hope is that these requests will decrease the amount of time that you wait before being seen by our physicians.       _____________________________________________________________  Should you have questions after your visit to [facility], please contact our office at [phone] between the hours of 8:30 a.m. and 4:30 p.m.  Voicemails left after 4:30 p.m. will not be returned until the following business day.  For prescription refill requests, have your pharmacy contact our office.         Resources For Cancer Patients and their Caregivers ? American Cancer Society: Can assist with transportation, wigs, general needs, runs Look Good Feel Better.        1-888-227-6333 ? Cancer Care: Provides financial  assistance, online support groups, medication/co-pay assistance.  1-800-813-HOPE (4673) ? [facility] Assists [place] cancer patients and their families through emotional , educational and financial support.  336-427-4357 ? [place] DSS Where to apply for food stamps, Medicaid and utility assistance. 336-342-1394 ? RCATS: Transportation to medical appointments. 336-347-2287 ? Social Security Administration: May apply for disability if have a Stage IV cancer. 336-342-7796 1-800-772-1213 ? [place] Aging, Disability and Transit Services: Assists with nutrition, care and transit needs. 336-349-2343  Cancer Center Support Programs: @10RELATIVEDAYS@ > Cancer Support Group  2nd Tuesday of the month 1pm-2pm, Journey Room  > Creative Journey  3rd Tuesday of the month 1130am-1pm, Journey Room  > Look Good Feel Better  1st Wednesday of the month 10am-12 noon, Journey Room (Call American Cancer Society to register 1-800-395-5775)    

## 2016-06-26 ENCOUNTER — Other Ambulatory Visit (HOSPITAL_COMMUNITY): Payer: Self-pay | Admitting: Hematology & Oncology

## 2016-06-26 DIAGNOSIS — R928 Other abnormal and inconclusive findings on diagnostic imaging of breast: Secondary | ICD-10-CM

## 2016-06-26 LAB — VITAMIN D 25 HYDROXY (VIT D DEFICIENCY, FRACTURES): VIT D 25 HYDROXY: 64.4 ng/mL (ref 30.0–100.0)

## 2016-06-28 ENCOUNTER — Telehealth (HOSPITAL_COMMUNITY): Payer: Self-pay | Admitting: Emergency Medicine

## 2016-06-28 NOTE — Telephone Encounter (Signed)
Called to see how pt was doing after she had her mammogram and they found something in the left breast that warranted further investigation.  She said she was ok but the more I talked with her she became tearful.  She states that she just doesn't want to go through this again and this is the way it started the last time.  I told her that I was the navigator now and that she could call me anytime she wanted to talk.  She said they she didn't need anything at this time.

## 2016-07-02 ENCOUNTER — Ambulatory Visit (HOSPITAL_COMMUNITY)
Admission: RE | Admit: 2016-07-02 | Discharge: 2016-07-02 | Disposition: A | Discharge status: Home / Self Care | Payer: 59 | Source: Ambulatory Visit | Attending: Hematology & Oncology | Admitting: Hematology & Oncology

## 2016-07-02 DIAGNOSIS — N6489 Other specified disorders of breast: Secondary | ICD-10-CM | POA: Diagnosis not present

## 2016-07-02 DIAGNOSIS — R928 Other abnormal and inconclusive findings on diagnostic imaging of breast: Secondary | ICD-10-CM

## 2016-07-04 ENCOUNTER — Other Ambulatory Visit (HOSPITAL_COMMUNITY): Payer: Self-pay | Admitting: Hematology & Oncology

## 2016-07-07 ENCOUNTER — Other Ambulatory Visit (HOSPITAL_COMMUNITY): Payer: Self-pay | Admitting: Hematology & Oncology

## 2016-07-08 ENCOUNTER — Other Ambulatory Visit (HOSPITAL_COMMUNITY): Payer: Self-pay

## 2016-07-08 DIAGNOSIS — E559 Vitamin D deficiency, unspecified: Secondary | ICD-10-CM

## 2016-07-08 MED ORDER — VITAMIN D (ERGOCALCIFEROL) 1.25 MG (50000 UNIT) PO CAPS: 50000.0000 [IU] | ORAL_CAPSULE | ORAL | 0 refills | Status: DC

## 2016-07-08 NOTE — Telephone Encounter (Signed)
Received refill request from patients pharmacy for Vitamin D. Chart checked and refilled.

## 2016-07-09 ENCOUNTER — Telehealth (HOSPITAL_COMMUNITY): Payer: Self-pay | Admitting: *Deleted

## 2016-07-09 NOTE — Telephone Encounter (Signed)
See below

## 2016-07-10 ENCOUNTER — Other Ambulatory Visit (HOSPITAL_COMMUNITY): Payer: Self-pay | Admitting: Oncology

## 2016-07-10 DIAGNOSIS — C50311 Malignant neoplasm of lower-inner quadrant of right female breast: Secondary | ICD-10-CM

## 2016-07-11 ENCOUNTER — Other Ambulatory Visit (HOSPITAL_COMMUNITY): Payer: Self-pay | Admitting: Oncology

## 2016-07-11 DIAGNOSIS — C50311 Malignant neoplasm of lower-inner quadrant of right female breast: Secondary | ICD-10-CM

## 2016-07-21 ENCOUNTER — Encounter (HOSPITAL_COMMUNITY): Payer: Self-pay | Admitting: Hematology & Oncology

## 2016-07-24 ENCOUNTER — Ambulatory Visit (HOSPITAL_COMMUNITY)
Admission: RE | Admit: 2016-07-24 | Discharge: 2016-07-24 | Disposition: A | Discharge status: Home / Self Care | Payer: 59 | Source: Ambulatory Visit | Attending: Oncology | Admitting: Oncology

## 2016-07-24 DIAGNOSIS — C50311 Malignant neoplasm of lower-inner quadrant of right female breast: Secondary | ICD-10-CM | POA: Insufficient documentation

## 2016-07-24 MED ORDER — GADOBENATE DIMEGLUMINE 529 MG/ML IV SOLN
20.0000 mL | Freq: Once | INTRAVENOUS | Status: AC | PRN
  Administered 2016-07-24: 17 mL via INTRAVENOUS

## 2016-08-05 ENCOUNTER — Other Ambulatory Visit (HOSPITAL_COMMUNITY): Payer: Self-pay | Admitting: Hematology & Oncology

## 2016-08-22 ENCOUNTER — Encounter (HOSPITAL_COMMUNITY): Payer: 59 | Attending: Hematology & Oncology

## 2016-08-22 ENCOUNTER — Encounter (HOSPITAL_COMMUNITY): Payer: 59

## 2016-08-22 ENCOUNTER — Encounter (HOSPITAL_COMMUNITY): Payer: Self-pay

## 2016-08-22 VITALS — BP 113/59 | HR 79 | Temp 98.7°F | Resp 18

## 2016-08-22 DIAGNOSIS — C50311 Malignant neoplasm of lower-inner quadrant of right female breast: Secondary | ICD-10-CM

## 2016-08-22 DIAGNOSIS — M8580 Other specified disorders of bone density and structure, unspecified site: Secondary | ICD-10-CM

## 2016-08-22 LAB — COMPREHENSIVE METABOLIC PANEL
ALBUMIN: 3.8 g/dL (ref 3.5–5.0)
ALK PHOS: 65 U/L (ref 38–126)
ALT: 11 U/L — AB (ref 14–54)
AST: 14 U/L — AB (ref 15–41)
Anion gap: 7 (ref 5–15)
BUN: 11 mg/dL (ref 6–20)
CALCIUM: 9.4 mg/dL (ref 8.9–10.3)
CHLORIDE: 101 mmol/L (ref 101–111)
CO2: 30 mmol/L (ref 22–32)
CREATININE: 0.88 mg/dL (ref 0.44–1.00)
GFR calc Af Amer: >60 mL/min (ref >60)
GFR calc non Af Amer: >60 mL/min (ref >60)
GLUCOSE: 100 mg/dL — AB (ref 65–99)
Potassium: 3.5 mmol/L (ref 3.5–5.1)
SODIUM: 138 mmol/L (ref 135–145)
Total Bilirubin: 0.6 mg/dL (ref 0.3–1.2)
Total Protein: 7.2 g/dL (ref 6.5–8.1)

## 2016-08-22 MED ORDER — DENOSUMAB 60 MG/ML ~~LOC~~ SOLN
60.0000 mg | Freq: Once | SUBCUTANEOUS | Status: AC
  Administered 2016-08-22: 60 mg via SUBCUTANEOUS
  Filled 2016-08-22: qty 1

## 2016-08-22 NOTE — Patient Instructions (Signed)
[facility] at [facility] Discharge Instructions  RECOMMENDATIONS MADE BY THE CONSULTANT AND ANY TEST RESULTS WILL BE SENT TO YOUR REFERRING PHYSICIAN.  Prolia given today. Follow up as scheduled.  Thank you for choosing [facility] at [facility] to provide your oncology and hematology care.  To afford each patient quality time with our provider, please arrive at least 15 minutes before your scheduled appointment time.    If you have a lab appointment with the Cancer Center please come in thru the  Main Entrance and check in at the main information desk  You need to re-schedule your appointment should you arrive 10 or more minutes late.  We strive to give you quality time with our providers, and arriving late affects you and other patients whose appointments are after yours.  Also, if you no show three or more times for appointments you may be dismissed from the clinic at the providers discretion.     Again, thank you for choosing [facility].  Our hope is that these requests will decrease the amount of time that you wait before being seen by our physicians.       _____________________________________________________________  Should you have questions after your visit to [facility], please contact our office at [phone] between the hours of 8:30 a.m. and 4:30 p.m.  Voicemails left after 4:30 p.m. will not be returned until the following business day.  For prescription refill requests, have your pharmacy contact our office.       Resources For Cancer Patients and their Caregivers ? American Cancer Society: Can assist with transportation, wigs, general needs, runs Look Good Feel Better.        1-888-227-6333 ? Cancer Care: Provides financial assistance, online support groups, medication/co-pay assistance.  1-800-813-HOPE (4673) ? [facility] Assists [place] cancer patients and  their families through emotional , educational and financial support.  336-427-4357 ? [place] DSS Where to apply for food stamps, Medicaid and utility assistance. 336-342-1394 ? RCATS: Transportation to medical appointments. 336-347-2287 ? Social Security Administration: May apply for disability if have a Stage IV cancer. 336-342-7796 1-800-772-1213 ? [place] Aging, Disability and Transit Services: Assists with nutrition, care and transit needs. 336-349-2343  Cancer Center Support Programs: @10RELATIVEDAYS@ > Cancer Support Group  2nd Tuesday of the month 1pm-2pm, Journey Room  > Creative Journey  3rd Tuesday of the month 1130am-1pm, Journey Room  > Look Good Feel Better  1st Wednesday of the month 10am-12 noon, Journey Room (Call American Cancer Society to register 1-800-395-5775)   

## 2016-08-22 NOTE — Progress Notes (Signed)
Lydia Stevens presents today for injection per MD orders. Prolia 60mg  administered SQ in left Abdomen. Administration without incident. Patient tolerated well. Vitals stable, discharged ambulatory from clinic. Follow up as scheduled.

## 2016-09-03 ENCOUNTER — Other Ambulatory Visit (HOSPITAL_COMMUNITY): Payer: Self-pay | Admitting: Hematology & Oncology

## 2016-09-28 ENCOUNTER — Other Ambulatory Visit (HOSPITAL_COMMUNITY): Payer: Self-pay | Admitting: Hematology & Oncology

## 2016-09-28 DIAGNOSIS — E559 Vitamin D deficiency, unspecified: Secondary | ICD-10-CM

## 2016-10-04 ENCOUNTER — Other Ambulatory Visit (HOSPITAL_COMMUNITY): Payer: Self-pay | Admitting: Hematology & Oncology

## 2016-10-25 ENCOUNTER — Encounter (HOSPITAL_COMMUNITY): Payer: 59 | Attending: Oncology | Admitting: Oncology

## 2016-10-25 ENCOUNTER — Encounter (HOSPITAL_COMMUNITY): Payer: Self-pay | Admitting: Oncology

## 2016-10-25 ENCOUNTER — Encounter (HOSPITAL_COMMUNITY): Payer: 59

## 2016-10-25 VITALS — BP 119/69 | HR 74 | Temp 98.6°F | Resp 18 | Wt 193.4 lb

## 2016-10-25 DIAGNOSIS — C50311 Malignant neoplasm of lower-inner quadrant of right female breast: Secondary | ICD-10-CM | POA: Diagnosis not present

## 2016-10-25 DIAGNOSIS — Z79811 Long term (current) use of aromatase inhibitors: Secondary | ICD-10-CM

## 2016-10-25 DIAGNOSIS — Z170 Estrogen receptor positive status [ER+]: Secondary | ICD-10-CM

## 2016-10-25 DIAGNOSIS — E559 Vitamin D deficiency, unspecified: Secondary | ICD-10-CM

## 2016-10-25 LAB — CBC WITH DIFFERENTIAL/PLATELET
Basophils Absolute: 0 10*3/uL (ref 0.0–0.1)
Basophils Relative: 0 %
EOS ABS: 0.1 10*3/uL (ref 0.0–0.7)
Eosinophils Relative: 1 %
HCT: 37.2 % (ref 36.0–46.0)
Hemoglobin: 12.6 g/dL (ref 12.0–15.0)
LYMPHS ABS: 2.6 10*3/uL (ref 0.7–4.0)
LYMPHS PCT: 29 %
MCH: 28.3 pg (ref 26.0–34.0)
MCHC: 33.9 g/dL (ref 30.0–36.0)
MCV: 83.4 fL (ref 78.0–100.0)
Monocytes Absolute: 0.5 10*3/uL (ref 0.1–1.0)
Monocytes Relative: 5 %
NEUTROS PCT: 65 %
Neutro Abs: 5.7 10*3/uL (ref 1.7–7.7)
Platelets: 272 10*3/uL (ref 150–400)
RBC: 4.46 MIL/uL (ref 3.87–5.11)
RDW: 15.5 % (ref 11.5–15.5)
WBC: 8.9 10*3/uL (ref 4.0–10.5)

## 2016-10-25 LAB — COMPREHENSIVE METABOLIC PANEL
ALK PHOS: 54 U/L (ref 38–126)
ALT: 13 U/L — AB (ref 14–54)
AST: 18 U/L (ref 15–41)
Albumin: 4.3 g/dL (ref 3.5–5.0)
Anion gap: 8 (ref 5–15)
BUN: 15 mg/dL (ref 6–20)
CALCIUM: 10.1 mg/dL (ref 8.9–10.3)
CO2: 29 mmol/L (ref 22–32)
Chloride: 101 mmol/L (ref 101–111)
Creatinine, Ser: 0.88 mg/dL (ref 0.44–1.00)
GFR calc non Af Amer: >60 mL/min (ref >60)
GLUCOSE: 93 mg/dL (ref 65–99)
Potassium: 4 mmol/L (ref 3.5–5.1)
SODIUM: 138 mmol/L (ref 135–145)
Total Bilirubin: 0.5 mg/dL (ref 0.3–1.2)
Total Protein: 8.1 g/dL (ref 6.5–8.1)

## 2016-10-25 NOTE — Patient Instructions (Signed)
[place] at [facility] Discharge Instructions  RECOMMENDATIONS MADE BY THE CONSULTANT AND ANY TEST RESULTS WILL BE SENT TO YOUR REFERRING PHYSICIAN.  You were seen today by Dr. Twana First Follow up in 6 months with lab work Continue prolia injections See Amy up front for appointments   Thank you for choosing [place] at [facility] to provide your oncology and hematology care.  To afford each patient quality time with our provider, please arrive at least 15 minutes before your scheduled appointment time.    If you have a lab appointment with the [facility] please come in thru the  Main Entrance and check in at the main information desk  You need to re-schedule your appointment should you arrive 10 or more minutes late.  We strive to give you quality time with our providers, and arriving late affects you and other patients whose appointments are after yours.  Also, if you no show three or more times for appointments you may be dismissed from the clinic at the providers discretion.     Again, thank you for choosing [facility].  Our hope is that these requests will decrease the amount of time that you wait before being seen by our physicians.       _____________________________________________________________  Should you have questions after your visit to [facility], please contact our office at [phone] between the hours of 8:30 a.m. and 4:30 p.m.  Voicemails left after 4:30 p.m. will not be returned until the following business day.  For prescription refill requests, have your pharmacy contact our office.       Resources For Cancer Patients and their Caregivers ? American Cancer Society: Can assist with transportation, wigs, general needs, runs Look Good Feel Better.        (740)481-6286 ? Cancer Care: Provides financial assistance, online support groups, medication/co-pay assistance.   1-800-813-HOPE 8308871350) ? [facility] Assists [place] cancer patients and their families through emotional , educational and financial support.  774 282 8855 ? [place] DSS Where to apply for food stamps, Medicaid and utility assistance. 605-400-9231 ? RCATS: Transportation to medical appointments. 720-504-9836 ? Social Security Administration: May apply for disability if have a Stage IV cancer. 7202877304 403-205-1026 ? [employer], Disability and Transit Services: Assists with nutrition, care and transit needs. [place] Support Programs: @10RELATIVEDAYS @ > Cancer Support Group  2nd Tuesday of the month 1pm-2pm, Journey Room  > Creative Journey  3rd Tuesday of the month 1130am-1pm, Journey Room  > Look Good Feel Better  1st Wednesday of the month 10am-12 noon, Journey Room (Call [facility] to register [phone])

## 2016-10-25 NOTE — Progress Notes (Signed)
[facility]  Progress Note  Patient Care Team: Alycia Rossetti, MD as PCP - General (Family Medicine) Alphonsa Overall, MD as Consulting Physician (General Surgery) Nicholas Lose, MD as Consulting Physician (Hematology and Oncology) Gery Pray, MD as Consulting Physician (Radiation Oncology)  CHIEF COMPLAINTS/PURPOSE OF CONSULTATION:     Breast cancer of lower-inner quadrant of right female breast ([place])   06/07/2014 Initial Biopsy    Right breast needle biopsy 5:00 position: Invasive ductal carcinoma with DCIS, ER 100%, PR 71%, Ki-67 33%, HER-2 negative ratio 1.03      06/17/2014 Breast MRI    Right breast: 10 x 7 x 5 mm biopsy-proven IDC with DCIS, left breast 7 x 7 x 7 mm fibroadenoma, left upper quadrant of the abdomen abutting the peritoneum 3 x 1.1 cm oval soft tissue mass      07/27/2014 Surgery    Right breast mastectomy: Invasive ductal carcinoma grade 3, 2 cm, intermediate grade DCIS, lymphovascular invasion identified, 2 SLN negative, T1 C. N0 M0 stage IA, ER positive, PR 7%, HER-2 negative, Ki-67 33%      07/27/2014 Oncotype testing    Recurrence Score of 24, placing patient in the intermediate risk group      09/06/2014 -  Chemotherapy    Taxotere/Cytoxan       09/08/2014 Adverse Reaction    Nasuea and voming three times x 2 days.  Added Aloxi and Emend to anti-emetic regimen.      10/18/2014 -  Chemotherapy    Zoladex      10/27/2014 Adverse Reaction    Hives, secondary to Zoladex?      02/16/2015 Surgery    Laparoscopic BSO with Dr. Alycia Rossetti       HISTORY OF PRESENTING ILLNESS:  Lydia Stevens 46 y.o. female is here for a follow up of R breast cancer, ER+, PR + HER 2 -.   She is doing well overall. She continues to take Arimidex and denies any issues with this medication. She takes calcium and Vitamin D. Her main complaint is persistent alopecia post chemo. She also reports constipation and currently takes a stool softener.   MEDICAL  HISTORY:  Past Medical History:  Diagnosis Date  . Anemia    before   . Blood transfusion without reported diagnosis   . Breast cancer ([place])    2015  . GERD (gastroesophageal reflux disease)   . History of breast cancer 12/29/2014  . Hives Mar 3rd 2016  . Hyperlipidemia   . Osteoporosis 08/18/2015  . Scoliosis     SURGICAL HISTORY: Past Surgical History:  Procedure Laterality Date  . BREAST RECONSTRUCTION Right 12/06/2014   Procedure: RIGHT NIPPLE AREOLAR RECONSTRUCTION WITH FULL THICKNESS SKIN GRAFT FROM RIGHT THIGH;  Surgeon: Irene Limbo, MD;  Location: [facility];  Service: Plastics;  Laterality: Right;  . BREAST REDUCTION SURGERY Left 12/06/2014   Procedure: LEFT BREAST REDUCTION FOR ASYMMETRY;  Surgeon: Irene Limbo, MD;  Location: [facility];  Service: Plastics;  Laterality: Left;  . CESAREAN SECTION  1996  . LATISSIMUS FLAP TO BREAST Right 07/27/2014   Procedure: TRAM FLAP RECONSTUCTION RIGHT CHEST;  Surgeon: Irene Limbo, MD;  Location: [facility];  Service: Plastics;  Laterality: Right;  . MASTECTOMY W/ SENTINEL NODE BIOPSY Right 07/27/2014   Procedure: RIGHT MASTECTOMY WITH RIGHT AXILLARY SENTINEL LYMPH NODE BIOPSY;  Surgeon: Alphonsa Overall, MD;  Location: [facility];  Service: General;  Laterality: Right;  . OOPHORECTOMY  2016  .  PORT-A-CATH REMOVAL Left 12/06/2014   Procedure: REMOVAL PORT-A-CATH;  Surgeon: Irene Limbo, MD;  Location: [facility];  Service: Plastics;  Laterality: Left;  . PORTACATH PLACEMENT Left 09/05/2013  . WISDOM TOOTH EXTRACTION      SOCIAL HISTORY: Social History   Social History  . Marital status: Married    Spouse name: N/A  . Number of children: 1  . Years of education: N/A   Occupational History  . Not on file.   Social History Main Topics  . Smoking status: Never Smoker  . Smokeless tobacco: Never Used  . Alcohol use No  . Drug use: No  . Sexual activity: Yes    Birth control/  protection: None   Other Topics Concern  . Not on file   Social History Narrative  . No narrative on file    FAMILY HISTORY: Family History  Problem Relation Age of Onset  . Heart attack Father   . Arthritis Father   . Heart disease Father     Massive MI  . Breast cancer Maternal Aunt 67  . Breast cancer Sister 61  . Cancer Sister   . Breast cancer Maternal Aunt 45  . Arthritis Mother   . Hypertension Mother   . Asthma Sister   . Alcohol abuse Brother   . Hypertension Brother   . Cancer Sister     1/2 - breast cancer  . Breast cancer Paternal Aunt 6    ALLERGIES:  is allergic to oxycodone.  MEDICATIONS:  Current Outpatient Prescriptions  Medication Sig Dispense Refill  . anastrozole (ARIMIDEX) 1 MG tablet TAKE 1 TABLET BY MOUTH DAILY 30 tablet 5  . BIOTIN PO Take 1 tablet by mouth daily.    . Calcium Carbonate-Vit D-Min (CALCIUM 1200 PO) Take by mouth.    . diazepam (VALIUM) 10 MG tablet Take 1 tablet (10 mg total) by mouth every 8 (eight) hours as needed (muscle spasm). 30 tablet 0  . traMADol (ULTRAM) 50 MG tablet Take 50 mg by mouth every 6 (six) hours as needed. Reported on 11/02/2015    . Vitamin D, Ergocalciferol, (DRISDOL) 50000 units CAPS capsule TAKE 1 CAPSULE BY MOUTH 1 TIME A WEEK 12 capsule 0   No current facility-administered medications for this visit.    Facility-Administered Medications Ordered in Other Visits  Medication Dose Route Frequency Provider Last Rate Last Dose  . diphenhydrAMINE (BENADRYL) injection 50 mg  50 mg Intravenous Once Patrici Ranks, MD        Review of Systems  Constitutional: Negative.   HENT: Negative.        Alopecia  Eyes: Negative.   Respiratory: Negative.   Cardiovascular: Negative.   Gastrointestinal: Positive for constipation.  Genitourinary: Negative.   Musculoskeletal: Negative.   Skin: Negative.   Neurological: Negative.   Endo/Heme/Allergies: Negative.   Psychiatric/Behavioral: Negative.   All other  systems reviewed and are negative. 14 point ROS was done and is otherwise as detailed above or in HPI   PHYSICAL EXAMINATION: ECOG PERFORMANCE STATUS: 1 - Symptomatic but completely ambulatory  There were no vitals filed for this visit. There were no vitals filed for this visit.   Physical Exam  Constitutional: She is oriented to person, place, and time and well-developed, well-nourished, and in no distress.  HENT:  Head: Normocephalic and atraumatic.  Nose: Nose normal.  Mouth/Throat: Oropharynx is clear and moist. No oropharyngeal exudate.  Eyes: Conjunctivae and EOM are normal. Pupils are equal, round, and reactive to  light. Right eye exhibits no discharge. Left eye exhibits no discharge. No scleral icterus.  Neck: Normal range of motion. Neck supple. No tracheal deviation present. No thyromegaly present.  Cardiovascular: Normal rate, regular rhythm and normal heart sounds.  Exam reveals no gallop and no friction rub.   No murmur heard. Pulmonary/Chest: Effort normal and breath sounds normal. She has no wheezes. She has no rales.    Abdominal: Soft. Bowel sounds are normal. She exhibits no distension and no mass. There is no tenderness. There is no rebound and no guarding.  Musculoskeletal: Normal range of motion. She exhibits no edema.  Lymphadenopathy:    She has no cervical adenopathy.  Neurological: She is alert and oriented to person, place, and time. She has normal reflexes. No cranial nerve deficit. Gait normal. Coordination normal.  Skin: Skin is warm and dry. No rash noted.  Psychiatric: Mood, memory, affect and judgment normal.  Nursing note and vitals reviewed.   LABORATORY DATA:  I have reviewed the data as listed Lab Results  Component Value Date   WBC 8.9 10/25/2016   HGB 12.6 10/25/2016   HCT 37.2 10/25/2016   MCV 83.4 10/25/2016   PLT 272 10/25/2016   CMP     Component Value Date/Time   NA 138 10/25/2016 1028   NA 139 09/05/2014 0914   K 4.0  10/25/2016 1028   K 3.8 09/05/2014 0914   CL 101 10/25/2016 1028   CO2 29 10/25/2016 1028   CO2 29 09/05/2014 0914   GLUCOSE 93 10/25/2016 1028   GLUCOSE 88 09/05/2014 0914   BUN 15 10/25/2016 1028   BUN 6.0 (L) 09/05/2014 0914   CREATININE 0.88 10/25/2016 1028   CREATININE 0.76 01/19/2015 0910   CREATININE 0.8 09/05/2014 0914   CALCIUM 10.1 10/25/2016 1028   CALCIUM 9.3 09/05/2014 0914   PROT 8.1 10/25/2016 1028   PROT 6.6 09/05/2014 0914   ALBUMIN 4.3 10/25/2016 1028   ALBUMIN 3.4 (L) 09/05/2014 0914   AST 18 10/25/2016 1028   AST 16 09/05/2014 0914   ALT 13 (L) 10/25/2016 1028   ALT 9 09/05/2014 0914   ALKPHOS 54 10/25/2016 1028   ALKPHOS 83 09/05/2014 0914   BILITOT 0.5 10/25/2016 1028   BILITOT 0.62 09/05/2014 0914   GFRNONAA >60 10/25/2016 1028   GFRAA >60 10/25/2016 1028     RADIOGRAPHIC STUDIES: I have personally reviewed the radiological images as listed and agreed with the findings in the report. No results found.   MR breast bilateral w wo contrast 07/24/2016 IMPRESSION: No abnormal enhancement seen in the reconstructed right breast or the left breast.  ASSESSMENT & PLAN:  Breast cancer of lower-inner quadrant of right female breast [facility])   Staging form: Breast, AJCC 7th Edition   - Clinical: Stage IA (T1b, N0, cM0) - Unsigned         Staging comments: Staged at breast conference 10.28.15 Mastectomy followed by TRAM flap Significant allergic reaction to Zoladex with facial swelling and hives, required treatment with steroids and Benadryl over a two-week period Mild nausea with tamoxifen Laparoscopic BSO Dr. Alycia Rossetti at [facility] 06/28/2015 with osteopenia, on Prolia 07/2015 Aromasin started 06/2015 Vitamin D deficiency  The patient will continue Arimidex and Calcium Vit D.  Clinically NED on physical exam. The patient is scheduled for diagnostic mammogram on 01/14/2017. Continue prolia. Repeat DEXA scan in 06/2017. I encouraged the patient to try  Metamucil to resolve constipation. I also encouraged her to increase fluid and vegetable intake.  RTC in 6 months with CBC CMP.    ORDERS PLACED FOR THIS ENCOUNTER: No orders of the defined types were placed in this encounter.  This document serves as a record of services personally performed by Twana First, MD. It was created on her behalf by Arlyce Harman, a trained medical scribe. The creation of this record is based on the scribe's personal observations and the provider's statements to them. This document has been checked and approved by the attending provider.  I have reviewed the above documentation for accuracy and completeness and I agree with the above.  This note was electronically signed.    Twana First, MD  10/25/2016 11:22 AM

## 2016-10-26 LAB — VITAMIN D 25 HYDROXY (VIT D DEFICIENCY, FRACTURES): VIT D 25 HYDROXY: 77.1 ng/mL (ref 30.0–100.0)

## 2016-12-24 ENCOUNTER — Telehealth (HOSPITAL_COMMUNITY): Payer: Self-pay

## 2016-12-24 NOTE — Telephone Encounter (Signed)
Received refill request from patients pharmacy for vitamin D. Reviewed with PA-C who said to stop the Vitamin D 50,000 units weekly and start Vitamin D OTC 2000 units daily. Notified patient and pharmacy. Patient verbalized understanding.

## 2017-01-14 ENCOUNTER — Ambulatory Visit (HOSPITAL_COMMUNITY)
Admission: RE | Admit: 2017-01-14 | Discharge: 2017-01-14 | Disposition: A | Discharge status: Home / Self Care | Payer: 59 | Source: Ambulatory Visit | Attending: Oncology | Admitting: Oncology

## 2017-01-14 DIAGNOSIS — C50311 Malignant neoplasm of lower-inner quadrant of right female breast: Secondary | ICD-10-CM | POA: Insufficient documentation

## 2017-01-15 ENCOUNTER — Other Ambulatory Visit (HOSPITAL_COMMUNITY): Payer: Self-pay | Admitting: Oncology

## 2017-01-15 DIAGNOSIS — C50311 Malignant neoplasm of lower-inner quadrant of right female breast: Secondary | ICD-10-CM

## 2017-02-05 ENCOUNTER — Telehealth: Payer: Self-pay | Admitting: *Deleted

## 2017-02-05 DIAGNOSIS — C50311 Malignant neoplasm of lower-inner quadrant of right female breast: Secondary | ICD-10-CM

## 2017-02-05 NOTE — Telephone Encounter (Signed)
okay

## 2017-02-05 NOTE — Telephone Encounter (Signed)
Received call from [facility].   Reports that they require new referral since the past one has expired to Dr. Talbert Cage.   Ok to order?

## 2017-02-05 NOTE — Telephone Encounter (Signed)
Referral placed.

## 2017-02-06 ENCOUNTER — Telehealth (HOSPITAL_COMMUNITY): Payer: Self-pay | Admitting: Oncology

## 2017-02-06 NOTE — Telephone Encounter (Signed)
pcp ref done by dr Janeann Forehand office. Called [facility] per her request to give her the ref info.  Lydia Stevens [phone]

## 2017-02-17 ENCOUNTER — Other Ambulatory Visit (HOSPITAL_COMMUNITY): Payer: Self-pay | Admitting: *Deleted

## 2017-02-17 DIAGNOSIS — C50311 Malignant neoplasm of lower-inner quadrant of right female breast: Secondary | ICD-10-CM

## 2017-02-20 ENCOUNTER — Encounter (HOSPITAL_COMMUNITY): Payer: 59 | Attending: Oncology

## 2017-02-20 ENCOUNTER — Encounter (HOSPITAL_COMMUNITY): Payer: 59

## 2017-02-20 ENCOUNTER — Encounter (HOSPITAL_COMMUNITY): Payer: Self-pay

## 2017-02-20 VITALS — BP 114/72 | HR 64 | Temp 97.9°F | Resp 18

## 2017-02-20 DIAGNOSIS — C50311 Malignant neoplasm of lower-inner quadrant of right female breast: Secondary | ICD-10-CM

## 2017-02-20 DIAGNOSIS — M8580 Other specified disorders of bone density and structure, unspecified site: Secondary | ICD-10-CM | POA: Diagnosis not present

## 2017-02-20 DIAGNOSIS — Z79811 Long term (current) use of aromatase inhibitors: Secondary | ICD-10-CM | POA: Diagnosis not present

## 2017-02-20 DIAGNOSIS — Z170 Estrogen receptor positive status [ER+]: Secondary | ICD-10-CM

## 2017-02-20 DIAGNOSIS — M810 Age-related osteoporosis without current pathological fracture: Secondary | ICD-10-CM

## 2017-02-20 LAB — CBC WITH DIFFERENTIAL/PLATELET
BASOS ABS: 0 10*3/uL (ref 0.0–0.1)
Basophils Relative: 0 %
EOS PCT: 1 %
Eosinophils Absolute: 0.1 10*3/uL (ref 0.0–0.7)
HCT: 35.5 % — ABNORMAL LOW (ref 36.0–46.0)
Hemoglobin: 11.9 g/dL — ABNORMAL LOW (ref 12.0–15.0)
LYMPHS PCT: 22 %
Lymphs Abs: 1.9 10*3/uL (ref 0.7–4.0)
MCH: 28.1 pg (ref 26.0–34.0)
MCHC: 33.5 g/dL (ref 30.0–36.0)
MCV: 83.7 fL (ref 78.0–100.0)
Monocytes Absolute: 0.5 10*3/uL (ref 0.1–1.0)
Monocytes Relative: 6 %
NEUTROS ABS: 6.2 10*3/uL (ref 1.7–7.7)
Neutrophils Relative %: 71 %
PLATELETS: 284 10*3/uL (ref 150–400)
RBC: 4.24 MIL/uL (ref 3.87–5.11)
RDW: 15.4 % (ref 11.5–15.5)
WBC: 8.7 10*3/uL (ref 4.0–10.5)

## 2017-02-20 LAB — COMPREHENSIVE METABOLIC PANEL
ALBUMIN: 3.9 g/dL (ref 3.5–5.0)
ALT: 13 U/L — AB (ref 14–54)
AST: 16 U/L (ref 15–41)
Alkaline Phosphatase: 71 U/L (ref 38–126)
Anion gap: 4 — ABNORMAL LOW (ref 5–15)
BUN: 12 mg/dL (ref 6–20)
CHLORIDE: 105 mmol/L (ref 101–111)
CO2: 31 mmol/L (ref 22–32)
CREATININE: 0.81 mg/dL (ref 0.44–1.00)
Calcium: 9.6 mg/dL (ref 8.9–10.3)
GFR calc Af Amer: >60 mL/min (ref >60)
GFR calc non Af Amer: >60 mL/min (ref >60)
GLUCOSE: 101 mg/dL — AB (ref 65–99)
Potassium: 3.5 mmol/L (ref 3.5–5.1)
Sodium: 140 mmol/L (ref 135–145)
Total Bilirubin: 0.6 mg/dL (ref 0.3–1.2)
Total Protein: 7.6 g/dL (ref 6.5–8.1)

## 2017-02-20 MED ORDER — DENOSUMAB 60 MG/ML ~~LOC~~ SOLN
60.0000 mg | Freq: Once | SUBCUTANEOUS | Status: AC
  Administered 2017-02-20: 60 mg via SUBCUTANEOUS
  Filled 2017-02-20: qty 1

## 2017-02-20 NOTE — Progress Notes (Signed)
Lydia Stevens presents today for injection per MD orders. Prolia 60 mg administered SQ in right lower abdomen. Administration without incident. Patient tolerated well. Patient discharged ambulatory and in stable condition from clinic to self. Follow up as scheduled.

## 2017-02-20 NOTE — Patient Instructions (Signed)
[place] at [facility] Discharge Instructions  RECOMMENDATIONS MADE BY THE CONSULTANT AND ANY TEST RESULTS WILL BE SENT TO YOUR REFERRING PHYSICIAN.  You received your Prolia injection today. Follow up as scheduled.  Thank you for choosing [facility] at [facility] to provide your oncology and hematology care.  To afford each patient quality time with our provider, please arrive at least 15 minutes before your scheduled appointment time.    If you have a lab appointment with the [facility] please come in thru the  Main Entrance and check in at the main information desk  You need to re-schedule your appointment should you arrive 10 or more minutes late.  We strive to give you quality time with our providers, and arriving late affects you and other patients whose appointments are after yours.  Also, if you no show three or more times for appointments you may be dismissed from the clinic at the providers discretion.     Again, thank you for choosing [facility].  Our hope is that these requests will decrease the amount of time that you wait before being seen by our physicians.       _____________________________________________________________  Should you have questions after your visit to [facility], please contact our office at [phone] between the hours of 8:30 a.m. and 4:30 p.m.  Voicemails left after 4:30 p.m. will not be returned until the following business day.  For prescription refill requests, have your pharmacy contact our office.       Resources For Cancer Patients and their Caregivers ? American Cancer Society: Can assist with transportation, wigs, general needs, runs Look Good Feel Better.        ([phone] ? Cancer Care: Provides financial assistance, online support groups, medication/co-pay assistance.  1-800-813-HOPE 680 209 9066) ? [place] Assists [place]  cancer patients and their families through emotional , educational and financial support.  505-165-0126 ? [place] DSS Where to apply for food stamps, Medicaid and utility assistance. 986 822 8418 ? [facility]: Transportation to medical appointments. 8582177194 ? Social Security Administration: May apply for disability if have a Stage IV cancer. (407)495-2780 508-276-0083 ? [employer], Disability and Transit Services: Assists with nutrition, care and transit needs. [place] Support Programs: @10RELATIVEDAYS @ > Cancer Support Group  2nd Tuesday of the month 1pm-2pm, Journey Room  > Creative Journey  3rd Tuesday of the month 1130am-1pm, Journey Room  > Look Good Feel Better  1st Wednesday of the month 10am-12 noon, Journey Room (Call [place] to register [phone])

## 2017-02-27 ENCOUNTER — Encounter: Payer: Self-pay | Admitting: Physician Assistant

## 2017-02-27 ENCOUNTER — Ambulatory Visit (INDEPENDENT_AMBULATORY_CARE_PROVIDER_SITE_OTHER): Payer: 59 | Admitting: Physician Assistant

## 2017-02-27 VITALS — BP 100/70 | HR 95 | Temp 98.1°F | Resp 16 | Ht 68.0 in | Wt 193.6 lb

## 2017-02-27 DIAGNOSIS — G5601 Carpal tunnel syndrome, right upper limb: Secondary | ICD-10-CM

## 2017-02-27 MED ORDER — MELOXICAM 7.5 MG PO TABS: 7.5000 mg | ORAL_TABLET | Freq: Every day | ORAL | 3 refills | Status: DC

## 2017-02-27 NOTE — Progress Notes (Signed)
Patient ID: Lydia Stevens MRN: [id number], DOB: 08/10/1971, 46 y.o. Date of Encounter: 02/27/2017, 10:31 AM    Chief Complaint:  Chief Complaint  Patient presents with  . right wrist pain     HPI: 46 y.o. year old female presents with above.   She states that she has been having right wrist pain for about 2 weeks. Says that she has had no known trauma or injury. Has not fallen and put out her hand to catch herself. Can not think of anything else that she has done out of the ordinary to be causing this.  She works from home -- "on the computer, typing all day." States that she has done no other projects around the house that have included repetitive motion of the wrist.  States that she has some pain constantly across the wrist and near the thenar prominence of the thumb. Says she has increased pain when she is in certain positions or if she applies pressure such as last night when she was getting out of the tub and put her hand down to get out of the tub. Also says that she has increased amount of pain there at night and it wakes her from sleep. Says that if she turns a certain way she feels shooting pain.  She has been using equate brand medication 500 mg 3 at a time to try to control the pain. Has tried no other treatment.  Says that she has felt some slight discomfort in the left wrist at times but it has been very mild and intermittent.    Home Meds:   Outpatient Medications Prior to Visit  Medication Sig Dispense Refill  . anastrozole (ARIMIDEX) 1 MG tablet TAKE 1 TABLET BY MOUTH DAILY 30 tablet 5  . BIOTIN PO Take 1 tablet by mouth daily.    . Calcium Carbonate-Vit D-Min (CALCIUM 1200 PO) Take by mouth.     Facility-Administered Medications Prior to Visit  Medication Dose Route Frequency Provider Last Rate Last Dose  . diphenhydrAMINE (BENADRYL) injection 50 mg  50 mg Intravenous Once Penland, Kelby Fam, MD        Allergies:  Allergies  Allergen Reactions  .  Oxycodone Nausea Only      Review of Systems: See HPI for pertinent ROS. All other ROS negative.    Physical Exam: Blood pressure 100/70, pulse 95, temperature 98.1 F (36.7 C), temperature source Oral, resp. rate 16, height 5\' 8"  (1.727 m), weight 193 lb 9.6 oz (87.8 kg), last menstrual period 06/19/2016, SpO2 98 %., Body mass index is 29.44 kg/m. General:  Obese AAF. Appears in no acute distress. Neck: Supple. No thyromegaly. No lymphadenopathy. Lungs: Clear bilaterally to auscultation without wheezes, rales, or rhonchi. Breathing is unlabored. Heart: Regular rhythm. No murmurs, rubs, or gallops. Msk:  Strength and tone normal for age. Inspection of right hand and wrist are normal. There is no swelling. No erythema. No warmth. Tinels: Positive: She does have increased amount of pain with percussion to the nerve. Phalens: She does have increased/worsening of symptoms with this position/maneuver. Extremities/Skin: Warm and dry.  Neuro: Alert and oriented X 3. Moves all extremities spontaneously. Gait is normal. CNII-XII grossly in tact. Psych:  Responds to questions appropriately with a normal affect.     ASSESSMENT AND PLAN:  46 y.o. year old female with  1. Carpal tunnel syndrome on right She is to take Motrin with food on a daily basis until pain/symptoms resolve. Rx is given for wrist  splint and she is to wear this as much as possible during the day and definitely wear it at night. If symptoms not improved in 2 weeks then follow-up or if they are not completely controlled with this treatment after several months then follow-up as well. Discussed with her that if symptoms not controlled with this treatment, then would refer to a hand specialist for further evaluation and management. - meloxicam (MOBIC) 7.5 MG tablet; Take 1 tablet (7.5 mg total) by mouth daily. With food.  Dispense: 30 tablet; Refill: 3   Signed, [address] [place], [place], [facility] 02/27/2017 10:31 AM

## 2017-03-04 ENCOUNTER — Telehealth: Payer: Self-pay

## 2017-03-04 DIAGNOSIS — M25531 Pain in right wrist: Secondary | ICD-10-CM

## 2017-03-04 MED ORDER — TRAMADOL HCL 50 MG PO TABS: ORAL_TABLET | ORAL | 0 refills | Status: DC

## 2017-03-04 NOTE — Telephone Encounter (Signed)
Rx called in to pharmacy. 

## 2017-03-04 NOTE — Telephone Encounter (Signed)
Patient was seen in the office on 7/5 for wrist pain and states she has taken the pain medication as well as purchased a splint patient states the pain is more intense and she can no longer deal with it.  Patient is requesting a referral and a stronger medication to help with the pain.  Pls advise about medication

## 2017-03-04 NOTE — Telephone Encounter (Signed)
Referral to Hand Specialist has been ordered.  Can call in Tramadol 50mg   1 -2 po Q 8 hours PRN Pain # 60 + 0

## 2017-03-05 DIAGNOSIS — M654 Radial styloid tenosynovitis [de Quervain]: Secondary | ICD-10-CM | POA: Insufficient documentation

## 2017-03-24 ENCOUNTER — Other Ambulatory Visit (HOSPITAL_COMMUNITY): Payer: Self-pay | Admitting: *Deleted

## 2017-03-24 MED ORDER — ANASTROZOLE 1 MG PO TABS: 1.0000 mg | ORAL_TABLET | Freq: Every day | ORAL | 5 refills | Status: DC

## 2017-04-29 ENCOUNTER — Encounter (HOSPITAL_BASED_OUTPATIENT_CLINIC_OR_DEPARTMENT_OTHER): Payer: 59 | Admitting: Oncology

## 2017-04-29 ENCOUNTER — Other Ambulatory Visit (HOSPITAL_COMMUNITY): Payer: 59

## 2017-04-29 ENCOUNTER — Encounter (HOSPITAL_COMMUNITY): Payer: Self-pay

## 2017-04-29 ENCOUNTER — Encounter (HOSPITAL_COMMUNITY): Payer: 59 | Attending: Oncology

## 2017-04-29 ENCOUNTER — Ambulatory Visit (HOSPITAL_COMMUNITY): Payer: 59

## 2017-04-29 VITALS — BP 117/73 | HR 64 | Resp 16 | Ht 67.5 in | Wt 193.0 lb

## 2017-04-29 DIAGNOSIS — C50311 Malignant neoplasm of lower-inner quadrant of right female breast: Secondary | ICD-10-CM | POA: Diagnosis present

## 2017-04-29 DIAGNOSIS — Z79811 Long term (current) use of aromatase inhibitors: Secondary | ICD-10-CM

## 2017-04-29 DIAGNOSIS — M8580 Other specified disorders of bone density and structure, unspecified site: Secondary | ICD-10-CM

## 2017-04-29 DIAGNOSIS — E538 Deficiency of other specified B group vitamins: Secondary | ICD-10-CM

## 2017-04-29 DIAGNOSIS — Z9011 Acquired absence of right breast and nipple: Secondary | ICD-10-CM

## 2017-04-29 DIAGNOSIS — Z170 Estrogen receptor positive status [ER+]: Secondary | ICD-10-CM | POA: Diagnosis not present

## 2017-04-29 LAB — COMPREHENSIVE METABOLIC PANEL
ALK PHOS: 56 U/L (ref 38–126)
ALT: 13 U/L — AB (ref 14–54)
ANION GAP: 6 (ref 5–15)
AST: 16 U/L (ref 15–41)
Albumin: 3.8 g/dL (ref 3.5–5.0)
BUN: 11 mg/dL (ref 6–20)
CALCIUM: 9.6 mg/dL (ref 8.9–10.3)
CO2: 29 mmol/L (ref 22–32)
CREATININE: 0.77 mg/dL (ref 0.44–1.00)
Chloride: 103 mmol/L (ref 101–111)
Glucose, Bld: 97 mg/dL (ref 65–99)
Potassium: 4.3 mmol/L (ref 3.5–5.1)
SODIUM: 138 mmol/L (ref 135–145)
Total Bilirubin: 0.5 mg/dL (ref 0.3–1.2)
Total Protein: 7.3 g/dL (ref 6.5–8.1)

## 2017-04-29 LAB — CBC WITH DIFFERENTIAL/PLATELET
Basophils Absolute: 0 10*3/uL (ref 0.0–0.1)
Basophils Relative: 0 %
EOS ABS: 0.1 10*3/uL (ref 0.0–0.7)
EOS PCT: 1 %
HCT: 36.9 % (ref 36.0–46.0)
HEMOGLOBIN: 12.3 g/dL (ref 12.0–15.0)
LYMPHS ABS: 2.2 10*3/uL (ref 0.7–4.0)
LYMPHS PCT: 27 %
MCH: 27.8 pg (ref 26.0–34.0)
MCHC: 33.3 g/dL (ref 30.0–36.0)
MCV: 83.3 fL (ref 78.0–100.0)
MONOS PCT: 6 %
Monocytes Absolute: 0.5 10*3/uL (ref 0.1–1.0)
Neutro Abs: 5.2 10*3/uL (ref 1.7–7.7)
Neutrophils Relative %: 66 %
PLATELETS: 248 10*3/uL (ref 150–400)
RBC: 4.43 MIL/uL (ref 3.87–5.11)
RDW: 15.6 % — ABNORMAL HIGH (ref 11.5–15.5)
WBC: 8 10*3/uL (ref 4.0–10.5)

## 2017-04-29 NOTE — Progress Notes (Signed)
[place]  Progress Note  Patient Care Team: Ursa, Modena Nunnery, MD as PCP - General (Family Medicine) Alphonsa Overall, MD as Consulting Physician (General Surgery) Nicholas Lose, MD as Consulting Physician (Hematology and Oncology) Gery Pray, MD as Consulting Physician (Radiation Oncology)  CHIEF COMPLAINTS/PURPOSE OF CONSULTATION:     Breast cancer of lower-inner quadrant of right female breast ([place])   06/07/2014 Initial Biopsy    Right breast needle biopsy 5:00 position: Invasive ductal carcinoma with DCIS, ER 100%, PR 71%, Ki-67 33%, HER-2 negative ratio 1.03      06/17/2014 Breast MRI    Right breast: 10 x 7 x 5 mm biopsy-proven IDC with DCIS, left breast 7 x 7 x 7 mm fibroadenoma, left upper quadrant of the abdomen abutting the peritoneum 3 x 1.1 cm oval soft tissue mass      07/27/2014 Surgery    Right breast mastectomy: Invasive ductal carcinoma grade 3, 2 cm, intermediate grade DCIS, lymphovascular invasion identified, 2 SLN negative, T1 C. N0 M0 stage IA, ER positive, PR 7%, HER-2 negative, Ki-67 33%      07/27/2014 Oncotype testing    Recurrence Score of 24, placing patient in the intermediate risk group      09/06/2014 -  Chemotherapy    Taxotere/Cytoxan       09/08/2014 Adverse Reaction    Nasuea and voming three times x 2 days.  Added Aloxi and Emend to anti-emetic regimen.      10/18/2014 -  Chemotherapy    Zoladex      10/27/2014 Adverse Reaction    Hives, secondary to Zoladex?      02/16/2015 Surgery    Laparoscopic BSO with Dr. Alycia Rossetti       HISTORY OF PRESENTING ILLNESS:  Lydia Stevens 46 y.o. female is here for a follow up of R breast cancer, ER+, PR + HER 2 -.   She is doing well overall. She continues to take Arimidex and denies any issues with this medication. She takes calcium and Vitamin D chew supplement. She has no complaints today and states she feels great. Her last left breast mammogram was in May 2018 and it was  birads 3 with some assymetric distortion in the upper outer quadrant. She has not palpated any breast masses. MEDICAL HISTORY:  Past Medical History:  Diagnosis Date  . Anemia    before   . Blood transfusion without reported diagnosis   . Breast cancer ([place])    2015  . GERD (gastroesophageal reflux disease)   . History of breast cancer 12/29/2014  . Hives Mar 3rd 2016  . Hyperlipidemia   . Osteoporosis 08/18/2015  . Scoliosis     SURGICAL HISTORY: Past Surgical History:  Procedure Laterality Date  . BREAST RECONSTRUCTION Right 12/06/2014   Procedure: RIGHT NIPPLE AREOLAR RECONSTRUCTION WITH FULL THICKNESS SKIN GRAFT FROM RIGHT THIGH;  Surgeon: Irene Limbo, MD;  Location: [place];  Service: Plastics;  Laterality: Right;  . BREAST REDUCTION SURGERY Left 12/06/2014   Procedure: LEFT BREAST REDUCTION FOR ASYMMETRY;  Surgeon: Irene Limbo, MD;  Location: [facility];  Service: Plastics;  Laterality: Left;  . CESAREAN SECTION  1996  . LATISSIMUS FLAP TO BREAST Right 07/27/2014   Procedure: TRAM FLAP RECONSTUCTION RIGHT CHEST;  Surgeon: Irene Limbo, MD;  Location: [facility];  Service: Plastics;  Laterality: Right;  . MASTECTOMY W/ SENTINEL NODE BIOPSY Right 07/27/2014   Procedure: RIGHT MASTECTOMY WITH RIGHT AXILLARY SENTINEL LYMPH NODE BIOPSY;  Surgeon: Alphonsa Overall, MD;  Location: [place];  Service: General;  Laterality: Right;  . OOPHORECTOMY  2016  . PORT-A-CATH REMOVAL Left 12/06/2014   Procedure: REMOVAL PORT-A-CATH;  Surgeon: Irene Limbo, MD;  Location: [place];  Service: Plastics;  Laterality: Left;  . PORTACATH PLACEMENT Left 09/05/2013  . WISDOM TOOTH EXTRACTION      SOCIAL HISTORY: Social History   Social History  . Marital status: Married    Spouse name: N/A  . Number of children: 1  . Years of education: N/A   Occupational History  . Not on file.   Social History Main Topics  . Smoking status: Never Smoker    . Smokeless tobacco: Never Used  . Alcohol use No  . Drug use: No  . Sexual activity: Yes    Birth control/ protection: None   Other Topics Concern  . Not on file   Social History Narrative  . No narrative on file    FAMILY HISTORY: Family History  Problem Relation Age of Onset  . Heart attack Father   . Arthritis Father   . Heart disease Father        Massive MI  . Breast cancer Maternal Aunt 2  . Breast cancer Sister 13  . Cancer Sister   . Breast cancer Maternal Aunt 70  . Arthritis Mother   . Hypertension Mother   . Asthma Sister   . Alcohol abuse Brother   . Hypertension Brother   . Cancer Sister        1/2 - breast cancer  . Breast cancer Paternal Aunt 38    ALLERGIES:  is allergic to oxycodone.  MEDICATIONS:  Current Outpatient Prescriptions  Medication Sig Dispense Refill  . anastrozole (ARIMIDEX) 1 MG tablet Take 1 tablet (1 mg total) by mouth daily. 30 tablet 5  . BIOTIN PO Take 1 tablet by mouth daily.    . Calcium Carbonate-Vit D-Min (CALCIUM 1200 PO) Take by mouth.    . meloxicam (MOBIC) 7.5 MG tablet Take 1 tablet (7.5 mg total) by mouth daily. With food. 30 tablet 3  . Probiotic Product (DIGESTIVE ADVANTAGE) CAPS Take by mouth.    . traMADol (ULTRAM) 50 MG tablet Take 1-2 tablets by mouth every 8 hours as needed for pain 60 tablet 0   No current facility-administered medications for this visit.    Facility-Administered Medications Ordered in Other Visits  Medication Dose Route Frequency Provider Last Rate Last Dose  . diphenhydrAMINE (BENADRYL) injection 50 mg  50 mg Intravenous Once Penland, Kelby Fam, MD        Review of Systems  Constitutional: Negative.   HENT: Negative.        Alopecia  Eyes: Negative.   Respiratory: Negative.   Cardiovascular: Negative.   Genitourinary: Negative.   Musculoskeletal: Negative.   Skin: Negative.   Neurological: Negative.   Endo/Heme/Allergies: Negative.   Psychiatric/Behavioral: Negative.   All  other systems reviewed and are negative. 14 point ROS was done and is otherwise as detailed above or in HPI   PHYSICAL EXAMINATION: ECOG PERFORMANCE STATUS: 1 - Symptomatic but completely ambulatory  Vitals:   04/29/17 1108  BP: 117/73  Pulse: 64  Resp: 16  SpO2: 100%   Filed Weights   04/29/17 1108  Weight: 193 lb (87.5 kg)     Physical Exam  Constitutional: She is oriented to person, place, and time and well-developed, well-nourished, and in no distress.  HENT:  Head: Normocephalic and atraumatic.  Nose: Nose normal.  Mouth/Throat: Oropharynx is clear and moist. No oropharyngeal exudate.  Eyes: Pupils are equal, round, and reactive to light. Conjunctivae and EOM are normal. Right eye exhibits no discharge. Left eye exhibits no discharge. No scleral icterus.  Neck: Normal range of motion. Neck supple. No tracheal deviation present. No thyromegaly present.  Cardiovascular: Normal rate, regular rhythm and normal heart sounds.  Exam reveals no gallop and no friction rub.   No murmur heard. Pulmonary/Chest: Effort normal and breath sounds normal. She has no wheezes. She has no rales.    Abdominal: Soft. Bowel sounds are normal. She exhibits no distension and no mass. There is no tenderness. There is no rebound and no guarding.  Musculoskeletal: Normal range of motion. She exhibits no edema.  Lymphadenopathy:    She has no cervical adenopathy.  Neurological: She is alert and oriented to person, place, and time. She has normal reflexes. No cranial nerve deficit. Gait normal. Coordination normal.  Skin: Skin is warm and dry. No rash noted.  Psychiatric: Mood, memory, affect and judgment normal.  Nursing note and vitals reviewed.   LABORATORY DATA:  I have reviewed the data as listed Lab Results  Component Value Date   WBC 8.0 04/29/2017   HGB 12.3 04/29/2017   HCT 36.9 04/29/2017   MCV 83.3 04/29/2017   PLT 248 04/29/2017   CMP     Component Value Date/Time   NA 138  04/29/2017 1041   NA 139 09/05/2014 0914   K 4.3 04/29/2017 1041   K 3.8 09/05/2014 0914   CL 103 04/29/2017 1041   CO2 29 04/29/2017 1041   CO2 29 09/05/2014 0914   GLUCOSE 97 04/29/2017 1041   GLUCOSE 88 09/05/2014 0914   BUN 11 04/29/2017 1041   BUN 6.0 (L) 09/05/2014 0914   CREATININE 0.77 04/29/2017 1041   CREATININE 0.76 01/19/2015 0910   CREATININE 0.8 09/05/2014 0914   CALCIUM 9.6 04/29/2017 1041   CALCIUM 9.3 09/05/2014 0914   PROT 7.3 04/29/2017 1041   PROT 6.6 09/05/2014 0914   ALBUMIN 3.8 04/29/2017 1041   ALBUMIN 3.4 (L) 09/05/2014 0914   AST 16 04/29/2017 1041   AST 16 09/05/2014 0914   ALT 13 (L) 04/29/2017 1041   ALT 9 09/05/2014 0914   ALKPHOS 56 04/29/2017 1041   ALKPHOS 83 09/05/2014 0914   BILITOT 0.5 04/29/2017 1041   BILITOT 0.62 09/05/2014 0914   GFRNONAA >60 04/29/2017 1041   GFRAA >60 04/29/2017 1041     RADIOGRAPHIC STUDIES: I have personally reviewed the radiological images as listed and agreed with the findings in the report. No results found.   MR breast bilateral w wo contrast 07/24/2016 IMPRESSION: No abnormal enhancement seen in the reconstructed right breast or the left breast.  ASSESSMENT & PLAN:  Breast cancer of lower-inner quadrant of right female breast [facility])   Staging form: Breast, AJCC 7th Edition   - Clinical: Stage IA (T1b, N0, cM0) - Unsigned         Staging comments: Staged at breast conference 10.28.15 Mastectomy followed by TRAM flap Significant allergic reaction to Zoladex with facial swelling and hives, required treatment with steroids and Benadryl over a two-week period Mild nausea with tamoxifen Laparoscopic BSO Dr. Alycia Rossetti at [facility] 06/28/2015 with osteopenia, on Prolia 07/2015 Aromasin started 06/2015 Vitamin D deficiency Currently on Arimidex  Continue Arimidex and Calcium Vit D. Tolerating Arimidex well without any side effects. Clinically NED on physical exam today.  I have ordered  a 6 month left breast  diagnostic mammogram with [name] to be done in November. Continue prolia q32month. Repeat DEXA scan in 06/2017. RTC in 6 months with CBC CMP.    ORDERS PLACED FOR THIS ENCOUNTER: Orders Placed This Encounter  Procedures  . DG Bone Density  . MM DIAG BREAST TOMO UNI LEFT  . [place]ast Limited Uni Left [employer] [place]  . CBC with Differential  . Comprehensive metabolic panel   This note was electronically signed.    LTwana First MD  04/29/2017 11:38 AM

## 2017-04-29 NOTE — Progress Notes (Signed)
Patient is taking Anastrozole as prescribed. Has not missed any doses. Pt reports that she has noticed some tingling in her hands that radiates into her arms. Pt denies any pain or numbness. Pt denies any other problems or complaints.

## 2017-05-02 ENCOUNTER — Ambulatory Visit (INDEPENDENT_AMBULATORY_CARE_PROVIDER_SITE_OTHER): Payer: 59 | Admitting: Family Medicine

## 2017-05-02 ENCOUNTER — Encounter: Payer: Self-pay | Admitting: Family Medicine

## 2017-05-02 VITALS — BP 110/70 | HR 87 | Temp 98.9°F | Resp 16 | Ht 68.0 in | Wt 195.4 lb

## 2017-05-02 DIAGNOSIS — Z13220 Encounter for screening for lipoid disorders: Secondary | ICD-10-CM | POA: Diagnosis not present

## 2017-05-02 DIAGNOSIS — Z124 Encounter for screening for malignant neoplasm of cervix: Secondary | ICD-10-CM

## 2017-05-02 DIAGNOSIS — E663 Overweight: Secondary | ICD-10-CM | POA: Insufficient documentation

## 2017-05-02 DIAGNOSIS — M8580 Other specified disorders of bone density and structure, unspecified site: Secondary | ICD-10-CM

## 2017-05-02 DIAGNOSIS — Z0000 Encounter for general adult medical examination without abnormal findings: Secondary | ICD-10-CM

## 2017-05-02 NOTE — Assessment & Plan Note (Signed)
Discussed cutting out the snacking increasing her protein and veggies increasing water.

## 2017-05-02 NOTE — Progress Notes (Signed)
   Subjective:    Patient ID: Lydia Stevens, female    DOB: Jun 13, 1971, 46 y.o.   MRN: [id number]  Patient presents for Annual Exam   She here for complete physical exam  Mammogram UTD ,  Followed by oncology for history breast cancer in 2015, has diagnostic mammogram folllow up in Dec  for some densities that they're following on the left side, though this is the same side she's had reconstructive surgery  PAP Smear due last done in 2016 / Menopausal- bilat oophorectomy Osteopenia, on Prolia injection has f/u in December the medication does cause her thousand dollars every 6 months History updated Her only oral medication is the Arimidex otherwise she takes calcium and vitamin D and biotin  Immunuzations UTD, declines flu shot   We discussed exercise she is not exercising and she is also snacking she works from home on the computer states that she often grabs snacks especially she is stressed. She knows that her weight has been going up. She has been on phentermine in the past that she lost weight but then gained it back very quickly.  Reviewed recent CMET/CBC     Review Of Systems:  GEN- denies fatigue, fever, weight loss,weakness, recent illness HEENT- denies eye drainage, change in vision, nasal discharge, CVS- denies chest pain, palpitations RESP- denies SOB, cough, wheeze ABD- denies N/V, change in stools, abd pain GU- denies dysuria, hematuria, dribbling, incontinence MSK- denies joint pain, muscle aches, injury Neuro- denies headache, dizziness, syncope, seizure activity       Objective:    BP 110/70   Pulse 87   Temp 98.9 F (37.2 C) (Oral)   Resp 16   Ht 5\' 8"  (1.727 m)   Wt 195 lb 6.4 oz (88.6 kg)   LMP 06/19/2016   SpO2 98%   BMI 29.71 kg/m  GEN- NAD, alert and oriented x3 HEENT- PERRL, EOMI, non injected sclera, pink conjunctiva, MMM, oropharynx clear, TM clear bilat, no effusion Neck- Supple, no thyromegaly CVS- RRR, no  murmur RESP-CTAB ABD-NABS,soft,NT,ND GU- normal external genitalia, vaginal mucosa pink and moist, cervix visualized no growth, no blood form os, No  discharge, no CMT, ovaries not palpated uterus normal size EXT- No edema Pulses- Radial, DP- 2+        Assessment & Plan:      Problem List Items Addressed This Visit      Unprioritized   Overweight (BMI 25.0-29.9)    Discussed cutting out the snacking increasing her protein and veggies increasing water.      Osteopenia determined by x-ray    I will have my referral nurse look into the cost of the probably a see a week and help with this a scholarship program that can assist her.       Other Visit Diagnoses    Routine general medical examination at a health care facility    -  Primary   CPE done, declines flu, return for fasting lipid panel. PAP Smear obtained, Discussed dietary changes to aide in weight loss   Cervical cancer screening       Relevant Orders   PAP, Thin Prep w/HPV rflx HPV Type 16/18 ([facility])   Screening cholesterol level       Relevant Orders   Lipid panel      Note: This dictation was prepared with Dragon dictation along with smaller phrase technology. Any transcriptional errors that result from this process are unintentional.

## 2017-05-02 NOTE — Patient Instructions (Addendum)
Return for cholesterol screening  I will check on Prolia

## 2017-05-02 NOTE — Assessment & Plan Note (Signed)
I will have my referral nurse look into the cost of the probably a see a week and help with this a scholarship program that can assist her.

## 2017-05-05 ENCOUNTER — Telehealth: Payer: Self-pay | Admitting: Family Medicine

## 2017-05-05 ENCOUNTER — Other Ambulatory Visit: Payer: 59

## 2017-05-05 DIAGNOSIS — Z13220 Encounter for screening for lipoid disorders: Secondary | ICD-10-CM

## 2017-05-05 LAB — LIPID PANEL
CHOL/HDL RATIO: 4.8 (calc) (ref <5.0)
CHOLESTEROL: 218 mg/dL — AB (ref <200)
HDL: 45 mg/dL — AB (ref >50)
LDL Cholesterol (Calc): 150 mg/dL (calc) — ABNORMAL HIGH
NON-HDL CHOLESTEROL (CALC): 173 mg/dL — AB (ref <130)
Triglycerides: 113 mg/dL (ref <150)

## 2017-05-05 NOTE — Telephone Encounter (Signed)
-----   Message from Alycia Rossetti, MD sent at 05/02/2017  3:30 PM EDT ----- Regarding: Pt on Prolia     Pt is on Prolia, prescribed by ONcology, Can you see if Drug rep can look into her insurance, it still cost her near $1000 every 6 months, they have her on a payment plan.  Would it cost less if she got it through [name]?

## 2017-05-05 NOTE — Telephone Encounter (Signed)
noted 

## 2017-05-05 NOTE — Telephone Encounter (Signed)
Pt currently getting Prolia injections every 6 months thru Oncologist.  She says she has a $900+ co-pay.  I ask her if they had ever mentioned the patient assistance to her.  She said NO.  I gave her number to the patient assistance, told her to call and they can enroll her over the phone.  [phone]

## 2017-05-06 ENCOUNTER — Other Ambulatory Visit: Payer: Self-pay

## 2017-05-06 DIAGNOSIS — E7800 Pure hypercholesterolemia, unspecified: Secondary | ICD-10-CM

## 2017-05-07 LAB — PAP, TP IMAGING W/ HPV RNA, RFLX HPV TYPE 16,18/45: HPV DNA High Risk: NOT DETECTED

## 2017-07-29 ENCOUNTER — Encounter (HOSPITAL_COMMUNITY): Payer: Self-pay

## 2017-07-29 ENCOUNTER — Ambulatory Visit (HOSPITAL_COMMUNITY)
Admission: RE | Admit: 2017-07-29 | Discharge: 2017-07-29 | Disposition: A | Discharge status: Home / Self Care | Payer: 59 | Source: Ambulatory Visit | Attending: Oncology | Admitting: Oncology

## 2017-07-29 ENCOUNTER — Other Ambulatory Visit (HOSPITAL_COMMUNITY): Payer: 59

## 2017-07-29 ENCOUNTER — Encounter (HOSPITAL_COMMUNITY): Payer: 59

## 2017-07-29 DIAGNOSIS — C50311 Malignant neoplasm of lower-inner quadrant of right female breast: Secondary | ICD-10-CM | POA: Insufficient documentation

## 2017-07-29 DIAGNOSIS — M8580 Other specified disorders of bone density and structure, unspecified site: Secondary | ICD-10-CM | POA: Insufficient documentation

## 2017-07-29 DIAGNOSIS — Z170 Estrogen receptor positive status [ER+]: Secondary | ICD-10-CM | POA: Insufficient documentation

## 2017-07-29 DIAGNOSIS — R928 Other abnormal and inconclusive findings on diagnostic imaging of breast: Secondary | ICD-10-CM | POA: Diagnosis not present

## 2017-08-21 ENCOUNTER — Other Ambulatory Visit (HOSPITAL_COMMUNITY): Payer: Self-pay | Admitting: *Deleted

## 2017-08-21 DIAGNOSIS — C50319 Malignant neoplasm of lower-inner quadrant of unspecified female breast: Secondary | ICD-10-CM

## 2017-08-22 ENCOUNTER — Other Ambulatory Visit: Payer: Self-pay

## 2017-08-22 ENCOUNTER — Encounter (HOSPITAL_COMMUNITY): Payer: 59 | Attending: Adult Health

## 2017-08-22 ENCOUNTER — Encounter (HOSPITAL_COMMUNITY): Payer: Self-pay

## 2017-08-22 ENCOUNTER — Encounter (HOSPITAL_COMMUNITY): Payer: 59

## 2017-08-22 VITALS — BP 125/67 | HR 87 | Temp 98.2°F | Resp 18

## 2017-08-22 DIAGNOSIS — Z170 Estrogen receptor positive status [ER+]: Secondary | ICD-10-CM | POA: Diagnosis not present

## 2017-08-22 DIAGNOSIS — C50311 Malignant neoplasm of lower-inner quadrant of right female breast: Secondary | ICD-10-CM

## 2017-08-22 DIAGNOSIS — M810 Age-related osteoporosis without current pathological fracture: Secondary | ICD-10-CM | POA: Diagnosis not present

## 2017-08-22 DIAGNOSIS — Z79811 Long term (current) use of aromatase inhibitors: Secondary | ICD-10-CM

## 2017-08-22 DIAGNOSIS — C50319 Malignant neoplasm of lower-inner quadrant of unspecified female breast: Secondary | ICD-10-CM

## 2017-08-22 DIAGNOSIS — M8580 Other specified disorders of bone density and structure, unspecified site: Secondary | ICD-10-CM | POA: Diagnosis not present

## 2017-08-22 LAB — COMPREHENSIVE METABOLIC PANEL
ALT: 14 U/L (ref 14–54)
AST: 17 U/L (ref 15–41)
Albumin: 4 g/dL (ref 3.5–5.0)
Alkaline Phosphatase: 75 U/L (ref 38–126)
Anion gap: 11 (ref 5–15)
BILIRUBIN TOTAL: 0.6 mg/dL (ref 0.3–1.2)
BUN: 11 mg/dL (ref 6–20)
CHLORIDE: 102 mmol/L (ref 101–111)
CO2: 26 mmol/L (ref 22–32)
Calcium: 10 mg/dL (ref 8.9–10.3)
Creatinine, Ser: 0.93 mg/dL (ref 0.44–1.00)
Glucose, Bld: 117 mg/dL — ABNORMAL HIGH (ref 65–99)
POTASSIUM: 4 mmol/L (ref 3.5–5.1)
Sodium: 139 mmol/L (ref 135–145)
TOTAL PROTEIN: 7.6 g/dL (ref 6.5–8.1)

## 2017-08-22 LAB — CBC WITH DIFFERENTIAL/PLATELET
Basophils Absolute: 0 10*3/uL (ref 0.0–0.1)
Basophils Relative: 0 %
EOS PCT: 1 %
Eosinophils Absolute: 0.1 10*3/uL (ref 0.0–0.7)
HEMATOCRIT: 38.9 % (ref 36.0–46.0)
Hemoglobin: 12.6 g/dL (ref 12.0–15.0)
LYMPHS ABS: 2.4 10*3/uL (ref 0.7–4.0)
LYMPHS PCT: 28 %
MCH: 27.8 pg (ref 26.0–34.0)
MCHC: 32.4 g/dL (ref 30.0–36.0)
MCV: 85.7 fL (ref 78.0–100.0)
MONO ABS: 0.3 10*3/uL (ref 0.1–1.0)
MONOS PCT: 4 %
NEUTROS ABS: 5.6 10*3/uL (ref 1.7–7.7)
Neutrophils Relative %: 67 %
PLATELETS: 284 10*3/uL (ref 150–400)
RBC: 4.54 MIL/uL (ref 3.87–5.11)
RDW: 15.4 % (ref 11.5–15.5)
WBC: 8.4 10*3/uL (ref 4.0–10.5)

## 2017-08-22 MED ORDER — DENOSUMAB 60 MG/ML ~~LOC~~ SOLN
60.0000 mg | Freq: Once | SUBCUTANEOUS | Status: AC
  Administered 2017-08-22: 60 mg via SUBCUTANEOUS
  Filled 2017-08-22: qty 1

## 2017-08-22 NOTE — Patient Instructions (Signed)
[place] at [facility] Discharge Instructions  RECOMMENDATIONS MADE BY THE CONSULTANT AND ANY TEST RESULTS WILL BE SENT TO YOUR REFERRING PHYSICIAN.  You had your prolia injection today Follow up in march as scheduled.  Thank you for choosing [place] at [facility] to provide your oncology and hematology care.  To afford each patient quality time with our provider, please arrive at least 15 minutes before your scheduled appointment time.    If you have a lab appointment with the [place] please come in thru the  Main Entrance and check in at the main information desk  You need to re-schedule your appointment should you arrive 10 or more minutes late.  We strive to give you quality time with our providers, and arriving late affects you and other patients whose appointments are after yours.  Also, if you no show three or more times for appointments you may be dismissed from the clinic at the providers discretion.     Again, thank you for choosing [facility].  Our hope is that these requests will decrease the amount of time that you wait before being seen by our physicians.       _____________________________________________________________  Should you have questions after your visit to [facility], please contact our office at [phone] between the hours of 8:30 a.m. and 4:30 p.m.  Voicemails left after 4:30 p.m. will not be returned until the following business day.  For prescription refill requests, have your pharmacy contact our office.       Resources For Cancer Patients and their Caregivers ? American Cancer Society: Can assist with transportation, wigs, general needs, runs Look Good Feel Better.        309-850-3908 ? Cancer Care: Provides financial assistance, online support groups, medication/co-pay assistance.  1-800-813-HOPE 936-698-6010) ? [place] Assists [place]  cancer patients and their families through emotional , educational and financial support.  (867) 590-4737 ? [place] DSS Where to apply for food stamps, Medicaid and utility assistance. 330 330 6918 ? RCATS: Transportation to medical appointments. 785-023-8370 ? Social Security Administration: May apply for disability if have a Stage IV cancer. (732)838-3512 272 878 6491 ? [employer], Disability and Transit Services: Assists with nutrition, care and transit needs. [place] Support Programs: @10RELATIVEDAYS @ > Cancer Support Group  2nd Tuesday of the month 1pm-2pm, Journey Room  > Creative Journey  3rd Tuesday of the month 1130am-1pm, Journey Room  > Look Good Feel Better  1st Wednesday of the month 10am-12 noon, Journey Room (Call [place] to register [phone])

## 2017-08-22 NOTE — Progress Notes (Signed)
Lydia Stevens presents today for injection per MD orders. Prolia 60 mg administered SQ in left lower abdomen. Administration without incident. Patient tolerated well. Patient tolerated treatment without incidence. Patient discharged ambulatory and in stable condition from clinic. Patient to follow up as scheduled.

## 2017-09-22 ENCOUNTER — Other Ambulatory Visit (HOSPITAL_COMMUNITY): Payer: Self-pay | Admitting: Emergency Medicine

## 2017-09-22 MED ORDER — ANASTROZOLE 1 MG PO TABS: 1.0000 mg | ORAL_TABLET | Freq: Every day | ORAL | 5 refills | Status: DC

## 2017-09-22 NOTE — Progress Notes (Signed)
arimidex refilled.

## 2017-10-27 ENCOUNTER — Inpatient Hospital Stay (HOSPITAL_COMMUNITY): Payer: 59 | Attending: Internal Medicine

## 2017-10-27 ENCOUNTER — Inpatient Hospital Stay (HOSPITAL_BASED_OUTPATIENT_CLINIC_OR_DEPARTMENT_OTHER): Payer: 59 | Admitting: Internal Medicine

## 2017-10-27 ENCOUNTER — Other Ambulatory Visit: Payer: Self-pay

## 2017-10-27 ENCOUNTER — Ambulatory Visit (HOSPITAL_COMMUNITY): Payer: 59

## 2017-10-27 ENCOUNTER — Encounter (HOSPITAL_COMMUNITY): Payer: Self-pay | Admitting: Internal Medicine

## 2017-10-27 ENCOUNTER — Other Ambulatory Visit (HOSPITAL_COMMUNITY): Payer: 59

## 2017-10-27 VITALS — BP 119/67 | HR 65 | Temp 98.6°F | Resp 16 | Ht 67.5 in | Wt 192.0 lb

## 2017-10-27 DIAGNOSIS — Z170 Estrogen receptor positive status [ER+]: Secondary | ICD-10-CM | POA: Diagnosis not present

## 2017-10-27 DIAGNOSIS — Z79811 Long term (current) use of aromatase inhibitors: Secondary | ICD-10-CM | POA: Diagnosis not present

## 2017-10-27 DIAGNOSIS — M8580 Other specified disorders of bone density and structure, unspecified site: Secondary | ICD-10-CM | POA: Diagnosis not present

## 2017-10-27 DIAGNOSIS — E785 Hyperlipidemia, unspecified: Secondary | ICD-10-CM | POA: Diagnosis not present

## 2017-10-27 DIAGNOSIS — C50311 Malignant neoplasm of lower-inner quadrant of right female breast: Secondary | ICD-10-CM

## 2017-10-27 DIAGNOSIS — E559 Vitamin D deficiency, unspecified: Secondary | ICD-10-CM | POA: Diagnosis not present

## 2017-10-27 DIAGNOSIS — Z9011 Acquired absence of right breast and nipple: Secondary | ICD-10-CM | POA: Insufficient documentation

## 2017-10-27 DIAGNOSIS — Z9221 Personal history of antineoplastic chemotherapy: Secondary | ICD-10-CM

## 2017-10-27 DIAGNOSIS — M419 Scoliosis, unspecified: Secondary | ICD-10-CM | POA: Diagnosis not present

## 2017-10-27 DIAGNOSIS — K219 Gastro-esophageal reflux disease without esophagitis: Secondary | ICD-10-CM | POA: Diagnosis not present

## 2017-10-27 DIAGNOSIS — F4321 Adjustment disorder with depressed mood: Secondary | ICD-10-CM | POA: Insufficient documentation

## 2017-10-27 LAB — COMPREHENSIVE METABOLIC PANEL
ALK PHOS: 58 U/L (ref 38–126)
ALT: 12 U/L — ABNORMAL LOW (ref 14–54)
AST: 14 U/L — AB (ref 15–41)
Albumin: 3.5 g/dL (ref 3.5–5.0)
Anion gap: 8 (ref 5–15)
BILIRUBIN TOTAL: 0.3 mg/dL (ref 0.3–1.2)
BUN: 12 mg/dL (ref 6–20)
CALCIUM: 9.2 mg/dL (ref 8.9–10.3)
CHLORIDE: 107 mmol/L (ref 101–111)
CO2: 27 mmol/L (ref 22–32)
CREATININE: 0.82 mg/dL (ref 0.44–1.00)
Glucose, Bld: 98 mg/dL (ref 65–99)
Potassium: 3.9 mmol/L (ref 3.5–5.1)
Sodium: 142 mmol/L (ref 135–145)
Total Protein: 6.8 g/dL (ref 6.5–8.1)

## 2017-10-27 LAB — CBC WITH DIFFERENTIAL/PLATELET
BASOS ABS: 0 10*3/uL (ref 0.0–0.1)
Basophils Relative: 0 %
EOS PCT: 2 %
Eosinophils Absolute: 0.1 10*3/uL (ref 0.0–0.7)
HEMATOCRIT: 33.8 % — AB (ref 36.0–46.0)
HEMOGLOBIN: 11 g/dL — AB (ref 12.0–15.0)
LYMPHS ABS: 2.2 10*3/uL (ref 0.7–4.0)
Lymphocytes Relative: 28 %
MCH: 27.2 pg (ref 26.0–34.0)
MCHC: 32.5 g/dL (ref 30.0–36.0)
MCV: 83.7 fL (ref 78.0–100.0)
Monocytes Absolute: 0.5 10*3/uL (ref 0.1–1.0)
Monocytes Relative: 6 %
NEUTROS ABS: 5 10*3/uL (ref 1.7–7.7)
NEUTROS PCT: 64 %
PLATELETS: 254 10*3/uL (ref 150–400)
RBC: 4.04 MIL/uL (ref 3.87–5.11)
RDW: 15.6 % — ABNORMAL HIGH (ref 11.5–15.5)
WBC: 7.7 10*3/uL (ref 4.0–10.5)

## 2017-10-27 NOTE — Patient Instructions (Addendum)
[place] at [facility] Discharge Instructions   You were seen today by Dr. Zoila Shutter Continue taking your Arimidex We will have you set up for mammogram in 3 months to follow up from your mammogram in December Prolia injection will be in June Follow up in June with our new provider Dr. Delton Coombes to review results   Thank you for choosing [place] at [facility] to provide your oncology and hematology care.  To afford each patient quality time with our provider, please arrive at least 15 minutes before your scheduled appointment time.    If you have a lab appointment with the [place] please come in thru the  Main Entrance and check in at the main information desk  You need to re-schedule your appointment should you arrive 10 or more minutes late.  We strive to give you quality time with our providers, and arriving late affects you and other patients whose appointments are after yours.  Also, if you no show three or more times for appointments you may be dismissed from the clinic at the providers discretion.     Again, thank you for choosing [facility].  Our hope is that these requests will decrease the amount of time that you wait before being seen by our physicians.       _____________________________________________________________  Should you have questions after your visit to [facility], please contact our office at [phone] between the hours of 8:30 a.m. and 4:30 p.m.  Voicemails left after 4:30 p.m. will not be returned until the following business day.  For prescription refill requests, have your pharmacy contact our office.       Resources For Cancer Patients and their Caregivers ? American Cancer Society: Can assist with transportation, wigs, general needs, runs Look Good Feel Better.        704-810-8680 ? Cancer Care: Provides financial assistance, online support groups,  medication/co-pay assistance.  1-800-813-HOPE (619)618-5978) ? [place] Assists [place] cancer patients and their families through emotional , educational and financial support.  (870)262-2239 ? [place] DSS Where to apply for food stamps, Medicaid and utility assistance. 651-173-7455 ? RCATS: Transportation to medical appointments. 915-797-6769 ? Social Security Administration: May apply for disability if have a Stage IV cancer. 414-394-7777 581-117-0421 ? [employer], Disability and Transit Services: Assists with nutrition, care and transit needs. [place] Support Programs:   > Cancer Support Group  2nd Tuesday of the month 1pm-2pm, Journey Room   > Creative Journey  3rd Tuesday of the month 1130am-1pm, Journey Room

## 2017-11-03 ENCOUNTER — Other Ambulatory Visit: Payer: 59

## 2017-11-03 DIAGNOSIS — E7800 Pure hypercholesterolemia, unspecified: Secondary | ICD-10-CM

## 2017-11-03 LAB — LIPID PANEL
Cholesterol: 208 mg/dL — ABNORMAL HIGH (ref <200)
HDL: 47 mg/dL — AB (ref >50)
LDL CHOLESTEROL (CALC): 139 mg/dL — AB
NON-HDL CHOLESTEROL (CALC): 161 mg/dL — AB (ref <130)
TRIGLYCERIDES: 103 mg/dL (ref <150)
Total CHOL/HDL Ratio: 4.4 (calc) (ref <5.0)

## 2017-11-16 NOTE — Progress Notes (Signed)
Diagnosis Malignant neoplasm of lower-inner quadrant of right breast of female, estrogen receptor positive ([place]) - Plan: MM Digital Diagnostic Unilat L  Staging Cancer Staging Breast cancer of lower-inner quadrant of right female breast ([place]) Staging form: Breast, AJCC 7th Edition - Clinical: Stage IA (T1b, N0, cM0) - Unsigned Staging comments: Staged at breast conference 10.28.15  - Pathologic: Stage IA (T1c, N0, cM0) - Signed by Seward Grater, MD on 09/13/2014 Staging comments: Staged from final mastectomy specimen read by Dr. Lyndon Code   Assessment and Plan:  1.  Stage IA (T1b, N0, cM0) followed by Dr. Talbert Cage.  She underwent mastectomy followed by TRAM flap.  She had significant allergic reaction to Zoladex with facial swelling and hives, required treatment with steroids and Benadryl over a two-week period.  She underwent  Laparoscopic BSO Dr. Alycia Rossetti at [facility]  Left diagnostic mammogram was done December 2018 and patient is recommended for left diagnostic mammogram in June 2019.  This is scheduled today.  She will return to clinic 01/2018  to go over the results.  She should continue Arimidex as directed.  2.  Osteopenia.  Last bone mineral density was done in December 2018 and was normal.  She will have repeat bone mineral density in December 2020.  She remains on Calcium and Vitamin D.    Interval History: 47 year old female with Stage IA (T1b, N0, cM0) followed by Dr. Talbert Cage.  She underwent mastectomy followed by TRAM flap.  She had significant allergic reaction to Zoladex with facial swelling and hives, required treatment with steroids and Benadryl over a two-week period.  She underwent  Laparoscopic BSO Dr. Alycia Rossetti at [facility]  Current Status:  Pt is seen today for follow-up.  She is tolerating Arimidex.      Breast cancer of lower-inner quadrant of right female breast ([place])   06/07/2014 Initial Biopsy    Right breast needle biopsy 5:00 position: Invasive ductal carcinoma with DCIS, ER 100%, PR  71%, Ki-67 33%, HER-2 negative ratio 1.03      06/17/2014 Breast MRI    Right breast: 10 x 7 x 5 mm biopsy-proven IDC with DCIS, left breast 7 x 7 x 7 mm fibroadenoma, left upper quadrant of the abdomen abutting the peritoneum 3 x 1.1 cm oval soft tissue mass      07/27/2014 Surgery    Right breast mastectomy: Invasive ductal carcinoma grade 3, 2 cm, intermediate grade DCIS, lymphovascular invasion identified, 2 SLN negative, T1 C. N0 M0 stage IA, ER positive, PR 7%, HER-2 negative, Ki-67 33%      07/27/2014 Oncotype testing    Recurrence Score of 24, placing patient in the intermediate risk group      09/06/2014 -  Chemotherapy    Taxotere/Cytoxan       09/08/2014 Adverse Reaction    Nasuea and voming three times x 2 days.  Added Aloxi and Emend to anti-emetic regimen.      10/18/2014 -  Chemotherapy    Zoladex      10/27/2014 Adverse Reaction    Hives, secondary to Zoladex?      02/16/2015 Surgery    Laparoscopic BSO with Dr. Alycia Rossetti        Problem List Patient Active Problem List   Diagnosis Date Noted  . Overweight (BMI 25.0-29.9) [E66.3] 05/02/2017  . Osteopenia determined by x-ray [M85.80] 08/18/2015  . Vitamin D deficiency [E55.9] 08/05/2015  . Scoliosis of lumbar spine [M41.9] 04/18/2015  . Situational depression [F43.21] 01/17/2015  . History of breast cancer [  Z85.3] 12/29/2014  . Acquired absence of breast and nipple [Z90.10] 12/06/2014  . Genetic testing [Z13.79] 07/15/2014  . Breast cancer of lower-inner quadrant of right female breast [facility]) [C50.311] 06/09/2014    Past Medical History Past Medical History:  Diagnosis Date  . Anemia    before   . Blood transfusion without reported diagnosis   . Breast cancer ([place])    2015  . GERD (gastroesophageal reflux disease)   . History of breast cancer 12/29/2014  . Hives Mar 3rd 2016  . Hyperlipidemia   . Osteoporosis 08/18/2015  . Scoliosis     Past Surgical History Past Surgical History:  Procedure  Laterality Date  . BREAST RECONSTRUCTION Right 12/06/2014   Procedure: RIGHT NIPPLE AREOLAR RECONSTRUCTION WITH FULL THICKNESS SKIN GRAFT FROM RIGHT THIGH;  Surgeon: Irene Limbo, MD;  Location: [place];  Service: Plastics;  Laterality: Right;  . BREAST REDUCTION SURGERY Left 12/06/2014   Procedure: LEFT BREAST REDUCTION FOR ASYMMETRY;  Surgeon: Irene Limbo, MD;  Location: [place];  Service: Plastics;  Laterality: Left;  . CESAREAN SECTION  1996  . LATISSIMUS FLAP TO BREAST Right 07/27/2014   Procedure: TRAM FLAP RECONSTUCTION RIGHT CHEST;  Surgeon: Irene Limbo, MD;  Location: [place];  Service: Plastics;  Laterality: Right;  . MASTECTOMY W/ SENTINEL NODE BIOPSY Right 07/27/2014   Procedure: RIGHT MASTECTOMY WITH RIGHT AXILLARY SENTINEL LYMPH NODE BIOPSY;  Surgeon: Alphonsa Overall, MD;  Location: [place];  Service: General;  Laterality: Right;  . OOPHORECTOMY  2016  . PORT-A-CATH REMOVAL Left 12/06/2014   Procedure: REMOVAL PORT-A-CATH;  Surgeon: Irene Limbo, MD;  Location: [place];  Service: Plastics;  Laterality: Left;  . PORTACATH PLACEMENT Left 09/05/2013  . WISDOM TOOTH EXTRACTION      Family History Family History  Problem Relation Age of Onset  . Heart attack Father   . Arthritis Father   . Heart disease Father        Massive MI  . Breast cancer Maternal Aunt 71  . Breast cancer Sister 43  . Cancer Sister   . Breast cancer Maternal Aunt 65  . Arthritis Mother   . Hypertension Mother   . Asthma Sister   . Alcohol abuse Brother   . Hypertension Brother   . Cancer Sister        1/2 - breast cancer  . Breast cancer Paternal Aunt 79     Social History  reports that she has never smoked. She has never used smokeless tobacco. She reports that she does not drink alcohol or use drugs.  Medications  Current Outpatient Medications:  .  anastrozole (ARIMIDEX) 1 MG tablet, Take 1 tablet (1 mg total) by mouth daily.,  Disp: 30 tablet, Rfl: 5 .  BIOTIN PO, Take 1 tablet by mouth daily., Disp: , Rfl:  .  Calcium Carbonate-Vit D-Min (CALCIUM 1200 PO), Take by mouth., Disp: , Rfl:  .  Probiotic Product (PROBIOTIC PO), Take by mouth., Disp: , Rfl:  No current facility-administered medications for this visit.   Facility-Administered Medications Ordered in Other Visits:  .  diphenhydrAMINE (BENADRYL) injection 50 mg, 50 mg, Intravenous, Once, [place], Kelby Fam, MD  Allergies Oxycodone  Review of Systems Review of Systems - Oncology ROS as per HPI otherwise 12 point ROS is negative.   Physical Exam  Vitals Wt Readings from Last 3 Encounters:  10/27/17 192 lb (87.1 kg)  05/02/17 195 lb 6.4 oz (88.6 kg)  04/29/17 193 lb (87.5  kg)   Temp Readings from Last 3 Encounters:  10/27/17 98.6 F (37 C) (Oral)  08/22/17 98.2 F (36.8 C) (Oral)  05/02/17 98.9 F (37.2 C) (Oral)   BP Readings from Last 3 Encounters:  10/27/17 119/67  08/22/17 125/67  05/02/17 110/70   Pulse Readings from Last 3 Encounters:  10/27/17 65  08/22/17 87  05/02/17 87   Constitutional: Well-developed, well-nourished, and in no distress.   HENT: Head: Normocephalic and atraumatic.  Mouth/Throat: No oropharyngeal exudate. Mucosa moist. Eyes: Pupils are equal, round, and reactive to light. Conjunctivae are normal. No scleral icterus.  Neck: Normal range of motion. Neck supple. No JVD present.  Cardiovascular: Normal rate, regular rhythm and normal heart sounds.  Exam reveals no gallop and no friction rub.   No murmur heard. Pulmonary/Chest: Effort normal and breath sounds normal. No respiratory distress. No wheezes.No rales.  Abdominal: Soft. Bowel sounds are normal. No distension. There is no tenderness. There is no guarding.  Musculoskeletal: No edema or tenderness.  Lymphadenopathy: No cervical, axillary or supraclavicular adenopathy.  Neurological: Alert and oriented to person, place, and time. No cranial nerve deficit.   Skin: Skin is warm and dry. No rash noted. No erythema. No pallor.  Psychiatric: Affect and judgment normal.  Bilateral Breast exam:  Right mastectomy with Tram.  Left breast reduction.  No dominant masses.    Labs Appointment on 10/27/2017  Component Date Value Ref Range Status  . WBC 10/27/2017 7.7  4.0 - 10.5 K/uL Final  . RBC 10/27/2017 4.04  3.87 - 5.11 MIL/uL Final  . Hemoglobin 10/27/2017 11.0* 12.0 - 15.0 g/dL Final  . HCT 10/27/2017 33.8* 36.0 - 46.0 % Final  . MCV 10/27/2017 83.7  78.0 - 100.0 fL Final  . MCH 10/27/2017 27.2  26.0 - 34.0 pg Final  . MCHC 10/27/2017 32.5  30.0 - 36.0 g/dL Final  . RDW 10/27/2017 15.6* 11.5 - 15.5 % Final  . Platelets 10/27/2017 254  150 - 400 K/uL Final  . Neutrophils Relative % 10/27/2017 64  % Final  . Neutro Abs 10/27/2017 5.0  1.7 - 7.7 K/uL Final  . Lymphocytes Relative 10/27/2017 28  % Final  . Lymphs Abs 10/27/2017 2.2  0.7 - 4.0 K/uL Final  . Monocytes Relative 10/27/2017 6  % Final  . Monocytes Absolute 10/27/2017 0.5  0.1 - 1.0 K/uL Final  . Eosinophils Relative 10/27/2017 2  % Final  . Eosinophils Absolute 10/27/2017 0.1  0.0 - 0.7 K/uL Final  . Basophils Relative 10/27/2017 0  % Final  . Basophils Absolute 10/27/2017 0.0  0.0 - 0.1 K/uL Final   Performed at [facility], [address], [place], [place] [address]  . Sodium 10/27/2017 142  135 - 145 mmol/L Final  . Potassium 10/27/2017 3.9  3.5 - 5.1 mmol/L Final  . Chloride 10/27/2017 107  101 - 111 mmol/L Final  . CO2 10/27/2017 27  22 - 32 mmol/L Final  . Glucose, Bld 10/27/2017 98  65 - 99 mg/dL Final  . BUN 10/27/2017 12  6 - 20 mg/dL Final  . Creatinine, Ser 10/27/2017 0.82  0.44 - 1.00 mg/dL Final  . Calcium 10/27/2017 9.2  8.9 - 10.3 mg/dL Final  . Total Protein 10/27/2017 6.8  6.5 - 8.1 g/dL Final  . Albumin 10/27/2017 3.5  3.5 - 5.0 g/dL Final  . AST 10/27/2017 14* 15 - 41 U/L Final  . ALT 10/27/2017 12* 14 - 54 U/L Final  . Alkaline Phosphatase 10/27/2017  58  38  - 126 U/L Final  . Total Bilirubin 10/27/2017 0.3  0.3 - 1.2 mg/dL Final  . GFR calc non Af Amer 10/27/2017 >60  >60 mL/min Final  . GFR calc Af Amer 10/27/2017 >60  >60 mL/min Final   Comment: (NOTE) The eGFR has been calculated using the CKD EPI equation. This calculation has not been validated in all clinical situations. eGFR's persistently <60 mL/min signify possible Chronic Kidney Disease.   Georgiann Hahn gap 10/27/2017 8  5 - 15 Final   Performed at [facility], [address] [place], [place] [address]     Left diagnostic mammogram done 07/29/2017:   IMPRESSION: Stable, probably benign left breast asymmetry and distortion which is felt to most likely represent postoperative changes status post left breast reduction mammoplasty in 2016. Recommendation is for an additional 18 month follow-up in 6 months from now.  Orders Placed This Encounter  Procedures  . MM Digital Diagnostic Unilat L    Standing Status:   Future    Standing Expiration Date:   10/27/2018    Order Specific Question:   Reason for Exam (SYMPTOM  OR DIAGNOSIS REQUIRED)    Answer:   right breast cancer.  asymmetry seen on left breast imaging    Order Specific Question:   Is the patient pregnant?    Answer:   No    Order Specific Question:   Preferred imaging location?    Answer:   [facility]       Zoila Shutter MD

## 2018-02-02 ENCOUNTER — Other Ambulatory Visit (HOSPITAL_COMMUNITY): Payer: Self-pay | Admitting: Oncology

## 2018-02-02 DIAGNOSIS — R928 Other abnormal and inconclusive findings on diagnostic imaging of breast: Secondary | ICD-10-CM

## 2018-02-03 ENCOUNTER — Encounter (HOSPITAL_COMMUNITY): Payer: 59

## 2018-02-03 ENCOUNTER — Ambulatory Visit (HOSPITAL_COMMUNITY)
Admission: RE | Admit: 2018-02-03 | Discharge: 2018-02-03 | Disposition: A | Discharge status: Home / Self Care | Payer: 59 | Source: Ambulatory Visit | Attending: Hematology | Admitting: Hematology

## 2018-02-03 ENCOUNTER — Encounter (HOSPITAL_COMMUNITY): Payer: Self-pay

## 2018-02-03 DIAGNOSIS — R928 Other abnormal and inconclusive findings on diagnostic imaging of breast: Secondary | ICD-10-CM | POA: Insufficient documentation

## 2018-02-20 ENCOUNTER — Other Ambulatory Visit (HOSPITAL_COMMUNITY): Payer: Self-pay

## 2018-02-20 DIAGNOSIS — Z170 Estrogen receptor positive status [ER+]: Principal | ICD-10-CM

## 2018-02-20 DIAGNOSIS — C50311 Malignant neoplasm of lower-inner quadrant of right female breast: Secondary | ICD-10-CM

## 2018-02-23 ENCOUNTER — Encounter (HOSPITAL_COMMUNITY): Payer: Self-pay | Admitting: Hematology

## 2018-02-23 ENCOUNTER — Inpatient Hospital Stay (HOSPITAL_COMMUNITY): Payer: 59 | Attending: Hematology | Admitting: Hematology

## 2018-02-23 ENCOUNTER — Other Ambulatory Visit (HOSPITAL_COMMUNITY): Payer: Self-pay | Admitting: Pharmacist

## 2018-02-23 ENCOUNTER — Inpatient Hospital Stay (HOSPITAL_COMMUNITY): Payer: 59

## 2018-02-23 VITALS — BP 108/50 | HR 92 | Temp 98.9°F | Resp 16 | Wt 195.3 lb

## 2018-02-23 DIAGNOSIS — Z170 Estrogen receptor positive status [ER+]: Principal | ICD-10-CM

## 2018-02-23 DIAGNOSIS — R928 Other abnormal and inconclusive findings on diagnostic imaging of breast: Secondary | ICD-10-CM | POA: Insufficient documentation

## 2018-02-23 DIAGNOSIS — M818 Other osteoporosis without current pathological fracture: Secondary | ICD-10-CM

## 2018-02-23 DIAGNOSIS — C50311 Malignant neoplasm of lower-inner quadrant of right female breast: Secondary | ICD-10-CM | POA: Diagnosis not present

## 2018-02-23 DIAGNOSIS — M810 Age-related osteoporosis without current pathological fracture: Secondary | ICD-10-CM

## 2018-02-23 LAB — COMPREHENSIVE METABOLIC PANEL
ALK PHOS: 64 U/L (ref 38–126)
ALT: 13 U/L (ref 0–44)
AST: 16 U/L (ref 15–41)
Albumin: 4 g/dL (ref 3.5–5.0)
Anion gap: 6 (ref 5–15)
BUN: 16 mg/dL (ref 6–20)
CALCIUM: 9.6 mg/dL (ref 8.9–10.3)
CO2: 30 mmol/L (ref 22–32)
CREATININE: 1.02 mg/dL — AB (ref 0.44–1.00)
Chloride: 103 mmol/L (ref 98–111)
Glucose, Bld: 107 mg/dL — ABNORMAL HIGH (ref 70–99)
Potassium: 3.7 mmol/L (ref 3.5–5.1)
Sodium: 139 mmol/L (ref 135–145)
TOTAL PROTEIN: 8 g/dL (ref 6.5–8.1)
Total Bilirubin: 0.7 mg/dL (ref 0.3–1.2)

## 2018-02-23 LAB — CBC WITH DIFFERENTIAL/PLATELET
BASOS ABS: 0 10*3/uL (ref 0.0–0.1)
BASOS PCT: 0 %
EOS ABS: 0.1 10*3/uL (ref 0.0–0.7)
Eosinophils Relative: 1 %
HCT: 37.6 % (ref 36.0–46.0)
HEMOGLOBIN: 12.6 g/dL (ref 12.0–15.0)
Lymphocytes Relative: 27 %
Lymphs Abs: 2.5 10*3/uL (ref 0.7–4.0)
MCH: 28.3 pg (ref 26.0–34.0)
MCHC: 33.5 g/dL (ref 30.0–36.0)
MCV: 84.5 fL (ref 78.0–100.0)
Monocytes Absolute: 0.5 10*3/uL (ref 0.1–1.0)
Monocytes Relative: 5 %
NEUTROS ABS: 6.1 10*3/uL (ref 1.7–7.7)
NEUTROS PCT: 67 %
Platelets: 265 10*3/uL (ref 150–400)
RBC: 4.45 MIL/uL (ref 3.87–5.11)
RDW: 15.4 % (ref 11.5–15.5)
WBC: 9.1 10*3/uL (ref 4.0–10.5)

## 2018-02-23 MED ORDER — DENOSUMAB 60 MG/ML ~~LOC~~ SOSY
60.0000 mg | PREFILLED_SYRINGE | Freq: Once | SUBCUTANEOUS | Status: DC
  Filled 2018-02-23: qty 1

## 2018-02-23 NOTE — Progress Notes (Signed)
[place] [place], [place] [address]   CLINIC:  Medical Oncology/Hematology  PCP:  Alycia Rossetti, MD [address] [place] [address] [phone]   REASON FOR VISIT:  Follow-up for right breast cancer ER+, PR+, HER 2-  CURRENT THERAPY: Arimidix  BRIEF ONCOLOGIC HISTORY:    Breast cancer of lower-inner quadrant of right female breast ([place])   06/07/2014 Initial Biopsy    Right breast needle biopsy 5:00 position: Invasive ductal carcinoma with DCIS, ER 100%, PR 71%, Ki-67 33%, HER-2 negative ratio 1.03      06/17/2014 Breast MRI    Right breast: 10 x 7 x 5 mm biopsy-proven IDC with DCIS, left breast 7 x 7 x 7 mm fibroadenoma, left upper quadrant of the abdomen abutting the peritoneum 3 x 1.1 cm oval soft tissue mass      07/27/2014 Surgery    Right breast mastectomy: Invasive ductal carcinoma grade 3, 2 cm, intermediate grade DCIS, lymphovascular invasion identified, 2 SLN negative, T1 C. N0 M0 stage IA, ER positive, PR 7%, HER-2 negative, Ki-67 33%      07/27/2014 Oncotype testing    Recurrence Score of 24, placing patient in the intermediate risk group      09/06/2014 -  Chemotherapy    Taxotere/Cytoxan       09/08/2014 Adverse Reaction    Nasuea and voming three times x 2 days.  Added Aloxi and Emend to anti-emetic regimen.      10/18/2014 -  Chemotherapy    Zoladex      10/27/2014 Adverse Reaction    Hives, secondary to Zoladex?      02/16/2015 Surgery    Laparoscopic BSO with Dr. Alycia Rossetti        CANCER STAGING: Cancer Staging Breast cancer of lower-inner quadrant of right female breast [facility]) Staging form: Breast, AJCC 7th Edition - Clinical: Stage IA (T1b, N0, cM0) - Unsigned - Pathologic: Stage IA (T1c, N0, cM0) - Signed by Seward Grater, MD on 09/13/2014    INTERVAL HISTORY:  Ms. Aull 47 y.o. female returns for routine follow-up of right breast cancer, ER+, PR+, HER 2-. Patient states she is doing well.  She continues to take Arimidix and denies any issues with this medication. Patient is having a dental procedure in the next week so we stopped the Prolia until her next visit in 6 months. Patient continues to take her calcium and Vit D. Patient denies any nausea, vomiting, or diarrhea. Patient had her last mammogram in May 2018 of her left breast which showed assymetric distortion in the upper outer quadrant. We rescheduled mammogram for 6 months to re-evaluate. Patient has been doing self breast exams and has not palpated any masses. Overall, she tells me she has been feeling pretty well. Energy levels 100%; appetite 100%.     REVIEW OF SYSTEMS:  Review of Systems  All other systems reviewed and are negative.    PAST MEDICAL/SURGICAL HISTORY:  Past Medical History:  Diagnosis Date  . Anemia    before   . Blood transfusion without reported diagnosis   . Breast cancer ([place])    2015  . GERD (gastroesophageal reflux disease)   . History of breast cancer 12/29/2014  . Hives Mar 3rd 2016  . Hyperlipidemia   . Osteoporosis 08/18/2015  . Scoliosis    Past Surgical History:  Procedure Laterality Date  . BREAST RECONSTRUCTION Right 12/06/2014   Procedure: RIGHT NIPPLE AREOLAR RECONSTRUCTION WITH FULL  THICKNESS SKIN GRAFT FROM RIGHT THIGH;  Surgeon: Irene Limbo, MD;  Location: [place];  Service: Plastics;  Laterality: Right;  . BREAST REDUCTION SURGERY Left 12/06/2014   Procedure: LEFT BREAST REDUCTION FOR ASYMMETRY;  Surgeon: Irene Limbo, MD;  Location: [place];  Service: Plastics;  Laterality: Left;  . CESAREAN SECTION  1996  . LATISSIMUS FLAP TO BREAST Right 07/27/2014   Procedure: TRAM FLAP RECONSTUCTION RIGHT CHEST;  Surgeon: Irene Limbo, MD;  Location: [place];  Service: Plastics;  Laterality: Right;  . MASTECTOMY W/ SENTINEL NODE BIOPSY Right 07/27/2014   Procedure: RIGHT MASTECTOMY WITH RIGHT AXILLARY SENTINEL LYMPH NODE BIOPSY;  Surgeon:  Alphonsa Overall, MD;  Location: [place];  Service: General;  Laterality: Right;  . OOPHORECTOMY  2016  . PORT-A-CATH REMOVAL Left 12/06/2014   Procedure: REMOVAL PORT-A-CATH;  Surgeon: Irene Limbo, MD;  Location: [place];  Service: Plastics;  Laterality: Left;  . PORTACATH PLACEMENT Left 09/05/2013  . WISDOM TOOTH EXTRACTION       SOCIAL HISTORY:  Social History   Socioeconomic History  . Marital status: Married    Spouse name: Not on file  . Number of children: 1  . Years of education: Not on file  . Highest education level: Not on file  Occupational History  . Not on file  Social Needs  . Financial resource strain: Not on file  . Food insecurity:    Worry: Not on file    Inability: Not on file  . Transportation needs:    Medical: Not on file    Non-medical: Not on file  Tobacco Use  . Smoking status: Never Smoker  . Smokeless tobacco: Never Used  Substance and Sexual Activity  . Alcohol use: No    Alcohol/week: 0.0 oz  . Drug use: No  . Sexual activity: Yes    Birth control/protection: None  Lifestyle  . Physical activity:    Days per week: Not on file    Minutes per session: Not on file  . Stress: Not on file  Relationships  . Social connections:    Talks on phone: Not on file    Gets together: Not on file    Attends religious service: Not on file    Active member of club or organization: Not on file    Attends meetings of clubs or organizations: Not on file    Relationship status: Not on file  . Intimate partner violence:    Fear of current or ex partner: Not on file    Emotionally abused: Not on file    Physically abused: Not on file    Forced sexual activity: Not on file  Other Topics Concern  . Not on file  Social History Narrative  . Not on file    FAMILY HISTORY:  Family History  Problem Relation Age of Onset  . Heart attack Father   . Arthritis Father   . Heart disease Father        Massive MI  . Breast cancer Maternal Aunt  64  . Breast cancer Sister 76  . Cancer Sister   . Breast cancer Maternal Aunt 30  . Arthritis Mother   . Hypertension Mother   . Asthma Sister   . Alcohol abuse Brother   . Hypertension Brother   . Cancer Sister        1/2 - breast cancer  . Breast cancer Paternal Aunt 58    CURRENT MEDICATIONS:  Outpatient Encounter  Medications as of 02/23/2018  Medication Sig  . anastrozole (ARIMIDEX) 1 MG tablet Take 1 tablet (1 mg total) by mouth daily.  [place] Kitchen BIOTIN PO Take 1 tablet by mouth daily.  . Calcium Carbonate-Vit D-Min (CALCIUM 1200 PO) Take by mouth.  . Probiotic Product (PROBIOTIC PO) Take by mouth.   Facility-Administered Encounter Medications as of 02/23/2018  Medication  . diphenhydrAMINE (BENADRYL) injection 50 mg    ALLERGIES:  Allergies  Allergen Reactions  . Oxycodone Nausea Only     PHYSICAL EXAM:  ECOG Performance status: 0  Vitals:   02/23/18 1527  BP: (!) 108/50  Pulse: 92  Resp: 16  Temp: 98.9 F (37.2 C)  SpO2: 100%   Filed Weights   02/23/18 1527  Weight: 195 lb 4.8 oz (88.6 kg)    Physical Exam HEENT: No masses. Chest: Bilateral clear to escalation. CVS: S1-S2 regular rate and rhythm. Breast exam: Right breast reconstruction by TRAM flap is within normal limits.  Left breast has no palpable masses.  Breast reduction scar on the left breast present. Abdomen: Soft nontender, no palpable masses. Extremities: No edema or cyanosis.  LABORATORY DATA:  I have reviewed the labs as listed.  CBC    Component Value Date/Time   WBC 9.1 02/23/2018 1456   RBC 4.45 02/23/2018 1456   HGB 12.6 02/23/2018 1456   HGB 11.7 09/05/2014 0914   HCT 37.6 02/23/2018 1456   HCT 35.4 09/05/2014 0914   PLT 265 02/23/2018 1456   PLT 297 09/05/2014 0914   MCV 84.5 02/23/2018 1456   MCV 84.9 09/05/2014 0914   MCH 28.3 02/23/2018 1456   MCHC 33.5 02/23/2018 1456   RDW 15.4 02/23/2018 1456   RDW 14.9 (H) 09/05/2014 0914   LYMPHSABS 2.5 02/23/2018 1456   LYMPHSABS  1.6 09/05/2014 0914   MONOABS 0.5 02/23/2018 1456   MONOABS 0.5 09/05/2014 0914   EOSABS 0.1 02/23/2018 1456   EOSABS 0.2 09/05/2014 0914   BASOSABS 0.0 02/23/2018 1456   BASOSABS 0.0 09/05/2014 0914   CMP Latest Ref Rng & Units 02/23/2018 10/27/2017 08/22/2017  Glucose 70 - 99 mg/dL 107(H) 98 117(H)  BUN 6 - 20 mg/dL 16 12 11   Creatinine 0.44 - 1.00 mg/dL 1.02(H) 0.82 0.93  Sodium 135 - 145 mmol/L 139 142 139  Potassium 3.5 - 5.1 mmol/L 3.7 3.9 4.0  Chloride 98 - 111 mmol/L 103 107 102  CO2 22 - 32 mmol/L 30 27 26   Calcium 8.9 - 10.3 mg/dL 9.6 9.2 10.0  Total Protein 6.5 - 8.1 g/dL 8.0 6.8 7.6  Total Bilirubin 0.3 - 1.2 mg/dL 0.7 0.3 0.6  Alkaline Phos 38 - 126 U/L 64 58 75  AST 15 - 41 U/L 16 14(L) 17  ALT 0 - 44 U/L 13 12(L) 14       DIAGNOSTIC IMAGING:  I have independently reviewed her mammogram of the left breast dated 02/03/2018 which was BI-RADS Category 3.  I discussed this with the patient.     ASSESSMENT & PLAN:   Breast cancer of lower-inner quadrant of right female breast 1.  Stage I right breast IDC: - Status post right mastectomy followed by TRAM flap in December 2015, 2 cm IDC, grade 3, 2 sentinel lymph node negative, Ki-67 of 33%, ER/PR positive, HER-2 negative - Anastrozole is being tolerated very well.  No musculoskeletal symptoms. - Left mammogram on 02/03/2018 was BI-RADS Category 3.  We will arrange for another mammogram in December.  We will see her back  in 6 months for follow-up.  2.  Osteopenia: Last DEXA scan was on 07/29/2017, with T score of -0.8 in the right femoral neck region.  She was started on Prolia every 6 months in December 2016.  She is having dental work done at this time.  Hence we will skip today's Prolia dose.  Will resume it in December.  She will continue calcium and vitamin D supplements.      Orders placed this encounter:  Orders Placed This Encounter  Procedures  . MM Digital Diagnostic Unilat L      Derek Jack,  MD [place] [phone]

## 2018-02-23 NOTE — Progress Notes (Signed)
Prolia consent signed.  Patient stating she is taking her calcium chews as directed.  Having dental work for crown.  Hold prolia shot today and reschedule per Dr. Delton Coombes.

## 2018-02-23 NOTE — Patient Instructions (Signed)
[facility] Discharge Instructions  You saw Dr. Katragadda today.   Thank you for choosing [facility] at [facility] to provide your oncology and hematology care.  To afford each patient quality time with our provider, please arrive at least 15 minutes before your scheduled appointment time.   If you have a lab appointment with the Cancer Center please come in thru the  Main Entrance and check in at the main information desk  You need to re-schedule your appointment should you arrive 10 or more minutes late.  We strive to give you quality time with our providers, and arriving late affects you and other patients whose appointments are after yours.  Also, if you no show three or more times for appointments you may be dismissed from the clinic at the providers discretion.     Again, thank you for choosing [facility].  Our hope is that these requests will decrease the amount of time that you wait before being seen by our physicians.       _____________________________________________________________  Should you have questions after your visit to [facility], please contact our office at [phone] between the hours of 8:30 a.m. and 4:30 p.m.  Voicemails left after 4:30 p.m. will not be returned until the following business day.  For prescription refill requests, have your pharmacy contact our office.       Resources For Cancer Patients and their Caregivers ? American Cancer Society: Can assist with transportation, wigs, general needs, runs Look Good Feel Better.        1-888-227-6333 ? Cancer Care: Provides financial assistance, online support groups, medication/co-pay assistance.  1-800-813-HOPE (4673) ? [facility] Assists [place] cancer patients and their families through emotional , educational and financial support.  336-427-4357 ? [place] DSS Where to apply for  food stamps, Medicaid and utility assistance. 336-342-1394 ? [facility]: Transportation to medical appointments. 336-347-2287 ? Social Security Administration: May apply for disability if have a Stage IV cancer. 336-342-7796 1-800-772-1213 ? [place] Aging, Disability and Transit Services: Assists with nutrition, care and transit needs. 336-349-2343  Cancer Center Support Programs:   > Cancer Support Group  2nd Tuesday of the month 1pm-2pm, Journey Room   > Creative Journey  3rd Tuesday of the month 1130am-1pm, Journey Room     

## 2018-02-25 ENCOUNTER — Encounter (HOSPITAL_COMMUNITY): Payer: Self-pay | Admitting: Hematology

## 2018-02-25 NOTE — Assessment & Plan Note (Signed)
1.  Stage I right breast IDC: - Status post right mastectomy followed by TRAM flap in December 2015, 2 cm IDC, grade 3, 2 sentinel lymph node negative, Ki-67 of 33%, ER/PR positive, HER-2 negative - Anastrozole is being tolerated very well.  No musculoskeletal symptoms. - Left mammogram on 02/03/2018 was BI-RADS Category 3.  We will arrange for another mammogram in December.  We will see her back in 6 months for follow-up.  2.  Osteopenia: Last DEXA scan was on 07/29/2017, with T score of -0.8 in the right femoral neck region.  She was started on Prolia every 6 months in December 2016.  She is having dental work done at this time.  Hence we will skip today's Prolia dose.  Will resume it in December.  She will continue calcium and vitamin D supplements.

## 2018-03-27 ENCOUNTER — Other Ambulatory Visit (HOSPITAL_COMMUNITY): Payer: Self-pay | Admitting: *Deleted

## 2018-03-27 MED ORDER — ANASTROZOLE 1 MG PO TABS: 1.0000 mg | ORAL_TABLET | Freq: Every day | ORAL | 5 refills | Status: DC

## 2018-03-27 NOTE — Telephone Encounter (Signed)
Chart reviewed and per Dr. Tomie China last note, Anastrozole refilled.

## 2018-05-04 ENCOUNTER — Encounter: Payer: 59 | Admitting: Family Medicine

## 2018-05-11 ENCOUNTER — Encounter: Payer: Self-pay | Admitting: Physician Assistant

## 2018-05-11 ENCOUNTER — Ambulatory Visit (INDEPENDENT_AMBULATORY_CARE_PROVIDER_SITE_OTHER): Payer: 59 | Admitting: Physician Assistant

## 2018-05-11 ENCOUNTER — Ambulatory Visit
Admission: RE | Admit: 2018-05-11 | Discharge: 2018-05-11 | Disposition: A | Discharge status: Home / Self Care | Payer: 59 | Source: Ambulatory Visit | Attending: Physician Assistant | Admitting: Physician Assistant

## 2018-05-11 VITALS — BP 100/78 | HR 119 | Temp 98.1°F | Resp 20 | Ht 67.5 in | Wt 184.0 lb

## 2018-05-11 DIAGNOSIS — M5441 Lumbago with sciatica, right side: Secondary | ICD-10-CM | POA: Diagnosis not present

## 2018-05-11 DIAGNOSIS — Z0000 Encounter for general adult medical examination without abnormal findings: Secondary | ICD-10-CM

## 2018-05-11 MED ORDER — CYCLOBENZAPRINE HCL 10 MG PO TABS: 10.0000 mg | ORAL_TABLET | Freq: Three times a day (TID) | ORAL | 0 refills | Status: DC | PRN

## 2018-05-11 MED ORDER — METHYLPREDNISOLONE ACETATE 80 MG/ML IJ SUSP
80.0000 mg | Freq: Once | INTRAMUSCULAR | Status: AC
  Administered 2018-05-11: 80 mg via INTRAMUSCULAR

## 2018-05-11 NOTE — Progress Notes (Signed)
Patient ID: Lydia Stevens MRN: [id number], DOB: 1970/10/05, 47 y.o. Date of Encounter: 05/11/2018, 9:01 AM    Chief Complaint:  Chief Complaint  Patient presents with  . Annual Exam     HPI: 47 y.o. year old female presents for a CPE.    She was initially on my schedule as a CPE.   However, when I enter the room she is standing in an awkward position and looks uncomfortable.   She states that she is having a muscle spasm in her right low back. Discussed that she does not have a GYN.  She usually has her GYN exam here as part of her CPE here.   Discussed that we will not be able to do a pelvic exam and complete exam today.   She is fasting today.   Therefore we will go ahead and check draw her fasting labs today but will address her back pain today and have her return next week for the remainder of her CPE.  She is agreeable with this approach.  She does note that she is supposed to be leaving for a cruise October 1.  She states that on Saturday, 05/09/2018 she and her husband were out and she was shopping for shoes and when she bent over she developed severe stabbing pain in her right low back.   She reports that her husband got her into his vehicle, took her home, and then she has been in bed since Saturday about 1 PM.   As long as she lays on her back or lays on her stomach, she feels okay but if she lays on either side at all then feels horrible.  Used her TENS unit and has used 800 mg ibuprofen. States that the pain is in her right low back going down her right thigh. She has had this happen in the past but it was a long time ago.  Reports that she had done no other physical activity to contribute to this back pain.  Had done no cleaning or outdoor yard work or exercise or lifting etc. to contribute to this.  Just bent over and developed this.   She does work a job 8-5 sitting.   States that she does work from home for [employer].  No other specific concerns to  address today.     Home Meds:   Outpatient Medications Prior to Visit  Medication Sig Dispense Refill  . anastrozole (ARIMIDEX) 1 MG tablet Take 1 tablet (1 mg total) by mouth daily. 30 tablet 5  . BIOTIN PO Take 1 tablet by mouth daily.    . Calcium Carbonate-Vit D-Min (CALCIUM 1200 PO) Take by mouth.    . Probiotic Product (PROBIOTIC PO) Take by mouth.     Facility-Administered Medications Prior to Visit  Medication Dose Route Frequency Provider Last Rate Last Dose  . diphenhydrAMINE (BENADRYL) injection 50 mg  50 mg Intravenous Once Penland, Kelby Fam, MD        Allergies:  Allergies  Allergen Reactions  . Oxycodone Nausea Only      Review of Systems: See HPI for pertinent ROS. All other ROS negative.    Physical Exam: Blood pressure 100/78, pulse (!) 119, temperature 98.1 F (36.7 C), temperature source Oral, resp. rate 20, height 5' 7.5" (1.715 m), weight 83.5 kg, last menstrual period 06/19/2016, SpO2 100 %., Body mass index is 28.39 kg/m. General:  WNWD AAF. Appears in mild distress.Appears uncomfortable. Neck: Supple. No thyromegaly. No lymphadenopathy. Lungs:  Clear bilaterally to auscultation without wheezes, rales, or rhonchi. Breathing is unlabored. Heart: Regular rhythm. No murmurs, rubs, or gallops. Msk:  Strength and tone normal for age. She has tenderness with palpation right low back radiating down right thigh. She is in some pain already and muscle spasm so did not have her get on exam table and do straight leg raise and hip abduction.  Do not want to cause increased muscle spasm. Extremities/Skin: Warm and dry.  Neuro: Alert and oriented X 3. Moves all extremities spontaneously. Gait is normal. CNII-XII grossly in tact. Psych:  Responds to questions appropriately with a normal affect.     ASSESSMENT AND PLAN:  47 y.o. year old female with   1. Acute right-sided low back pain with right-sided sciatica Minister Depo-Medrol 80 mg IM here in the  office. She was take Flexeril 3 times daily for muscle relaxer.  Cautioned that this will cause drowsiness. She is to rest her back and lie flat on her back until symptoms are improved.   As pain improves, she is still to make sure to have support to her back muscles. Will Obtain lumbar spine x-ray.  I reviewed her imaging tab but no recent lumbar spine x-ray. Given for out of work today and tomorrow.  Plan to return Wednesday, 05/13/2018 as a tentative return date but will follow-up to see how her symptoms improve. - cyclobenzaprine (FLEXERIL) 10 MG tablet; Take 1 tablet (10 mg total) by mouth 3 (three) times daily as needed for muscle spasms.  Dispense: 30 tablet; Refill: 0 - DG Lumbar Spine 2-3 Views; Future  2. Encounter for preventive health examination Will go ahead and check fasting labs today while she is fasting.  Will have her return to perform her CPE next week. She is leaving for a cruise on October 1.  Therefore will try to get in her physical and get her back pain symptoms resolved prior to that cruise/October 1. - CBC with Differential/Platelet - COMPLETE METABOLIC PANEL WITH GFR - Lipid panel - TSH   Signed, [address] [place], [place], [facility] 05/11/2018 9:01 AM

## 2018-05-11 NOTE — Addendum Note (Signed)
Addended by: Vonna Kotyk A on: 05/11/2018 10:53 AM   Modules accepted: Orders

## 2018-05-12 LAB — CBC WITH DIFFERENTIAL/PLATELET
BASOS ABS: 38 {cells}/uL (ref 0–200)
Basophils Relative: 0.5 %
EOS ABS: 122 {cells}/uL (ref 15–500)
Eosinophils Relative: 1.6 %
HCT: 42.4 % (ref 35.0–45.0)
Hemoglobin: 14.2 g/dL (ref 11.7–15.5)
Lymphs Abs: 1870 cells/uL (ref 850–3900)
MCH: 27.9 pg (ref 27.0–33.0)
MCHC: 33.5 g/dL (ref 32.0–36.0)
MCV: 83.3 fL (ref 80.0–100.0)
MONOS PCT: 5.2 %
MPV: 10.7 fL (ref 7.5–12.5)
NEUTROS PCT: 68.1 %
Neutro Abs: 5176 cells/uL (ref 1500–7800)
PLATELETS: 330 10*3/uL (ref 140–400)
RBC: 5.09 10*6/uL (ref 3.80–5.10)
RDW: 14.5 % (ref 11.0–15.0)
TOTAL LYMPHOCYTE: 24.6 %
WBC mixed population: 395 cells/uL (ref 200–950)
WBC: 7.6 10*3/uL (ref 3.8–10.8)

## 2018-05-12 LAB — TSH: TSH: 1.01 mIU/L

## 2018-05-12 LAB — COMPLETE METABOLIC PANEL WITH GFR
AG RATIO: 1.4 (calc) (ref 1.0–2.5)
ALT: 11 U/L (ref 6–29)
AST: 16 U/L (ref 10–35)
Albumin: 4.8 g/dL (ref 3.6–5.1)
Alkaline phosphatase (APISO): 91 U/L (ref 33–115)
BUN/Creatinine Ratio: 18 (calc) (ref 6–22)
BUN: 21 mg/dL (ref 7–25)
CHLORIDE: 101 mmol/L (ref 98–110)
CO2: 23 mmol/L (ref 20–32)
CREATININE: 1.19 mg/dL — AB (ref 0.50–1.10)
Calcium: 11.2 mg/dL — ABNORMAL HIGH (ref 8.6–10.2)
GFR, Est African American: 63 mL/min/{1.73_m2} (ref >=60)
GFR, Est Non African American: 55 mL/min/{1.73_m2} — ABNORMAL LOW (ref >=60)
Globulin: 3.5 g/dL (calc) (ref 1.9–3.7)
Glucose, Bld: 88 mg/dL (ref 65–99)
Potassium: 4.4 mmol/L (ref 3.5–5.3)
Sodium: 145 mmol/L (ref 135–146)
TOTAL PROTEIN: 8.3 g/dL — AB (ref 6.1–8.1)
Total Bilirubin: 0.5 mg/dL (ref 0.2–1.2)

## 2018-05-12 LAB — LIPID PANEL
CHOL/HDL RATIO: 5.1 (calc) — AB (ref <5.0)
CHOLESTEROL: 277 mg/dL — AB (ref <200)
HDL: 54 mg/dL (ref >50)
LDL CHOLESTEROL (CALC): 203 mg/dL — AB
Non-HDL Cholesterol (Calc): 223 mg/dL (calc) — ABNORMAL HIGH (ref <130)
TRIGLYCERIDES: 84 mg/dL (ref <150)

## 2018-05-18 ENCOUNTER — Ambulatory Visit (INDEPENDENT_AMBULATORY_CARE_PROVIDER_SITE_OTHER): Payer: 59 | Admitting: Physician Assistant

## 2018-05-18 ENCOUNTER — Encounter: Payer: Self-pay | Admitting: Physician Assistant

## 2018-05-18 VITALS — BP 102/78 | HR 83 | Temp 98.1°F | Resp 16 | Ht 67.5 in | Wt 187.4 lb

## 2018-05-18 DIAGNOSIS — E785 Hyperlipidemia, unspecified: Secondary | ICD-10-CM

## 2018-05-18 DIAGNOSIS — Z0000 Encounter for general adult medical examination without abnormal findings: Secondary | ICD-10-CM

## 2018-05-18 MED ORDER — SIMVASTATIN 10 MG PO TABS: 10.0000 mg | ORAL_TABLET | Freq: Every evening | ORAL | 1 refills | Status: DC

## 2018-05-18 NOTE — Progress Notes (Signed)
Patient ID: ZENDAYA GROSECLOSE MRN: [id number], DOB: 29-Nov-1970, 47 y.o. Date of Encounter: 05/18/2018,   Chief Complaint: Physical (CPE)  HPI: 47 y.o. y/o female  here for CPE.   She recently had visit with me regarding some back pain on 05/11/2018. Today she reports that the treatment I gave her worked in her back pain has completely resolved.  She presents for CPE today.  She has no specific complaints or concerns to address today.   Review of Systems: Consitutional: No fever, chills, fatigue, night sweats, lymphadenopathy. No significant/unexplained weight changes. Eyes: No visual changes, eye redness, or discharge. ENT/Mouth: No ear pain, sore throat, nasal drainage, or sinus pain. Cardiovascular: No chest pressure,heaviness, tightness or squeezing, even with exertion. No increased shortness of breath or dyspnea on exertion.No palpitations, edema, orthopnea, PND. Respiratory: No cough, hemoptysis, SOB, or wheezing. Gastrointestinal: No anorexia, dysphagia, reflux, pain, nausea, vomiting, hematemesis, diarrhea, constipation, BRBPR, or melena. Breast: No mass, nodules, bulging, or retraction. No skin changes or inflammation. No nipple discharge. No lymphadenopathy. Genitourinary: No dysuria, hematuria, incontinence, vaginal discharge, pruritis, burning, abnormal bleeding, or pain. Musculoskeletal: No decreased ROM, No joint pain or swelling. No significant pain in neck, back, or extremities. Skin: No rash, pruritis, or concerning lesions. Neurological: No headache, dizziness, syncope, seizures, tremors, memory loss, coordination problems, or paresthesias. Psychological: No anxiety, depression, hallucinations, SI/HI. Endocrine: No polydipsia, polyphagia, polyuria, or known diabetes.No increased fatigue. No palpitations/rapid heart rate. No significant/unexplained weight change. All other systems were reviewed and are otherwise negative.  Past Medical History:  Diagnosis Date  .  Anemia    before   . Blood transfusion without reported diagnosis   . Breast cancer ([place])    2015  . GERD (gastroesophageal reflux disease)   . History of breast cancer 12/29/2014  . Hives Mar 3rd 2016  . Hyperlipidemia   . Osteoporosis 08/18/2015  . Scoliosis      Past Surgical History:  Procedure Laterality Date  . BREAST RECONSTRUCTION Right 12/06/2014   Procedure: RIGHT NIPPLE AREOLAR RECONSTRUCTION WITH FULL THICKNESS SKIN GRAFT FROM RIGHT THIGH;  Surgeon: Irene Limbo, MD;  Location: [facility];  Service: Plastics;  Laterality: Right;  . BREAST REDUCTION SURGERY Left 12/06/2014   Procedure: LEFT BREAST REDUCTION FOR ASYMMETRY;  Surgeon: Irene Limbo, MD;  Location: [place];  Service: Plastics;  Laterality: Left;  . CESAREAN SECTION  1996  . LATISSIMUS FLAP TO BREAST Right 07/27/2014   Procedure: TRAM FLAP RECONSTUCTION RIGHT CHEST;  Surgeon: Irene Limbo, MD;  Location: [facility];  Service: Plastics;  Laterality: Right;  . MASTECTOMY W/ SENTINEL NODE BIOPSY Right 07/27/2014   Procedure: RIGHT MASTECTOMY WITH RIGHT AXILLARY SENTINEL LYMPH NODE BIOPSY;  Surgeon: Alphonsa Overall, MD;  Location: [facility];  Service: General;  Laterality: Right;  . OOPHORECTOMY  2016  . PORT-A-CATH REMOVAL Left 12/06/2014   Procedure: REMOVAL PORT-A-CATH;  Surgeon: Irene Limbo, MD;  Location: [facility];  Service: Plastics;  Laterality: Left;  . PORTACATH PLACEMENT Left 09/05/2013  . WISDOM TOOTH EXTRACTION      Home Meds:  Outpatient Medications Prior to Visit  Medication Sig Dispense Refill  . anastrozole (ARIMIDEX) 1 MG tablet Take 1 tablet (1 mg total) by mouth daily. 30 tablet 5  . BIOTIN PO Take 1 tablet by mouth daily.    . Calcium Carbonate-Vit D-Min (CALCIUM 1200 PO) Take by mouth.    . cyclobenzaprine (FLEXERIL) 10 MG tablet Take 1 tablet (10 mg total) by  mouth 3 (three) times daily as needed for muscle spasms. 30 tablet 0    Facility-Administered Medications Prior to Visit  Medication Dose Route Frequency Provider Last Rate Last Dose  . diphenhydrAMINE (BENADRYL) injection 50 mg  50 mg Intravenous Once Penland, Kelby Fam, MD        Allergies:  Allergies  Allergen Reactions  . Oxycodone Nausea Only    Social History   Socioeconomic History  . Marital status: Married    Spouse name: Not on file  . Number of children: 1  . Years of education: Not on file  . Highest education level: Not on file  Occupational History  . Not on file  Social Needs  . Financial resource strain: Not on file  . Food insecurity:    Worry: Not on file    Inability: Not on file  . Transportation needs:    Medical: Not on file    Non-medical: Not on file  Tobacco Use  . Smoking status: Never Smoker  . Smokeless tobacco: Never Used  Substance and Sexual Activity  . Alcohol use: No    Alcohol/week: 0.0 standard drinks  . Drug use: No  . Sexual activity: Yes    Birth control/protection: None  Lifestyle  . Physical activity:    Days per week: Not on file    Minutes per session: Not on file  . Stress: Not on file  Relationships  . Social connections:    Talks on phone: Not on file    Gets together: Not on file    Attends religious service: Not on file    Active member of club or organization: Not on file    Attends meetings of clubs or organizations: Not on file    Relationship status: Not on file  . Intimate partner violence:    Fear of current or ex partner: Not on file    Emotionally abused: Not on file    Physically abused: Not on file    Forced sexual activity: Not on file  Other Topics Concern  . Not on file  Social History Narrative  . Not on file    Family History  Problem Relation Age of Onset  . Heart attack Father   . Arthritis Father   . Heart disease Father        Massive MI  . Breast cancer Maternal Aunt 63  . Breast cancer Sister 83  . Cancer Sister   . Breast cancer Maternal Aunt 110   . Arthritis Mother   . Hypertension Mother   . Asthma Sister   . Alcohol abuse Brother   . Hypertension Brother   . Cancer Sister        1/2 - breast cancer  . Breast cancer Paternal Aunt 69    Physical Exam: Blood pressure 102/78, pulse 83, temperature 98.1 F (36.7 C), temperature source Oral, resp. rate 16, height 5' 7.5" (1.715 m), weight 85 kg, last menstrual period 06/19/2016, SpO2 93 %., Body mass index is 28.92 kg/m. General: Well developed, well nourished AAF. Appears in no acute distress. HEENT: Normocephalic, atraumatic. Conjunctiva pink, sclera non-icteric. Pupils 2 mm constricting to 1 mm, round, regular, and equally reactive to light and accomodation. EOMI. Internal auditory canal clear. TMs with good cone of light and without pathology. Nasal mucosa pink. Nares are without discharge. No sinus tenderness. Oral mucosa pink. Neck: Supple. Trachea midline. No thyromegaly. Full ROM. No lymphadenopathy.No Carotid Bruits. Lungs: Clear to auscultation bilaterally without wheezes, rales, or  rhonchi. Breathing is of normal effort and unlabored. Cardiovascular: RRR with S1 S2. No murmurs, rubs, or gallops. Distal pulses 2+ symmetrically. No carotid or abdominal bruits. Breast: She has had surgery to breasts.  No masses. Nipples without discharge. Abdomen: Soft, non-tender, non-distended with normoactive bowel sounds. No hepatosplenomegaly or masses. No rebound/guarding. No CVA tenderness. No hernias.  Genitourinary:  External genitalia without lesions. Vaginal mucosa pink.No discharge present. Cervix pink and without discharge. No cervical tenderness.Normal uterus size.  Musculoskeletal: Full range of motion and 5/5 strength throughout.  Skin: Warm and moist without erythema, ecchymosis, wounds, or rash. Neuro: A+Ox3. CN II-XII grossly intact. Moves all extremities spontaneously. Full sensation throughout. Normal gait.  Psych:  Responds to questions appropriately with a normal affect.    Assessment/Plan:  47 y.o. y/o female here for CPE   1. Encounter for preventive health examination  A. Screening Labs: Checked labs at her visit 05/11/2018.  Labs were normal except cholesterol showed LDL 203.  See #2 below for further details regarding that. Otherwise CBC, CME T, TSH were normal.  B. Pap: She had Pap smear 05/02/2017 that was negative.  Can wait to repeat Pap smear.  Pelvic exam normal today.  She did have bilateral oophorectomy after her diagnosed breast cancer.  C. Screening Mammogram: She had breast cancer diagnosis in 2015.  Last mammogram 02/03/2018.  D. DEXA/BMD:  He is on Prolia as well as calcium and vitamin D. Last DEXA scan 07/29/2017 T score -0.8 normal.  E. Colorectal Cancer Screening: She has no indication to require colorectal cancer screening until age 81.  F. Immunizations:  Influenza:----------- Discussed flu vaccine but she defers. Tetanus: ------------ Is up-to-date.  Received Tdap 2016. Pneumococcal: -----Need to see if oncology recommended pneumonia vaccine. Shingrix: ------------ will discuss at age 64.    2. Hyperlipidemia, unspecified hyperlipidemia type Today discussed that LDL is up to 203.  Discussed that this is increased compared to last couple of lipid panels.  States that she actually has tried to improve her diet and has been more careful with her diet.  For is agreeable to go ahead and add medication as she has already made diet improvements but LDL is even worse.   genetic component.  Given her relatively young age will use low-dose statin.  Start simvastatin 10 mg daily.  Recheck fasting FLP, LFTs 6 weeks. - simvastatin (ZOCOR) 10 MG tablet; Take 1 tablet (10 mg total) by mouth every evening.  Dispense: 30 tablet; Refill: 1 - Lipid panel; Future - Hepatic function panel; Future  3. H/O Breast Cancer --She has undergone treatment for this by Oncology.Managed by Oncology.   Marin Olp [place], [place], [facility] 05/18/2018 2:18  PM

## 2018-06-29 ENCOUNTER — Ambulatory Visit: Payer: 59 | Admitting: Physician Assistant

## 2018-07-01 ENCOUNTER — Encounter: Payer: Self-pay | Admitting: Family Medicine

## 2018-07-01 ENCOUNTER — Other Ambulatory Visit: Payer: Self-pay

## 2018-07-01 ENCOUNTER — Ambulatory Visit (INDEPENDENT_AMBULATORY_CARE_PROVIDER_SITE_OTHER): Payer: 59 | Admitting: Family Medicine

## 2018-07-01 VITALS — BP 110/64 | HR 84 | Temp 98.7°F | Resp 14 | Ht 67.5 in | Wt 184.0 lb

## 2018-07-01 DIAGNOSIS — E785 Hyperlipidemia, unspecified: Secondary | ICD-10-CM

## 2018-07-01 NOTE — Patient Instructions (Addendum)
Come in about 6 weeks from now to do fasting labs to recheck your cholesterol and your liver function    Fat and Cholesterol Restricted Diet Getting too much fat and cholesterol in your diet may cause health problems. Following this diet helps keep your fat and cholesterol at normal levels. This can keep you from getting sick. What types of fat should I choose?  Choose monosaturated and polyunsaturated fats. These are found in foods such as olive oil, canola oil, flaxseeds, walnuts, almonds, and seeds.  Eat more omega-3 fats. Good choices include salmon, mackerel, sardines, tuna, flaxseed oil, and ground flaxseeds.  Limit saturated fats. These are in animal products such as meats, butter, and cream. They can also be in plant products such as palm oil, palm kernel oil, and coconut oil.  Avoid foods with partially hydrogenated oils in them. These contain trans fats. Examples of foods that have trans fats are stick margarine, some tub margarines, cookies, crackers, and other baked goods. What general guidelines do I need to follow?  Check food labels. Look for the words "trans fat" and "saturated fat."  When preparing a meal: ? Fill half of your plate with vegetables and green salads. ? Fill one fourth of your plate with whole grains. Look for the word "whole" as the first word in the ingredient list. ? Fill one fourth of your plate with lean protein foods.  Eat more foods that have fiber, like apples, carrots, beans, peas, and barley.  Eat more home-cooked foods. Eat less at restaurants and buffets.  Limit or avoid alcohol.  Limit foods high in starch and sugar.  Limit fried foods.  Cook foods without frying them. Baking, boiling, grilling, and broiling are all great options.  Lose weight if you are overweight. Losing even a small amount of weight can help your overall health. It can also help prevent diseases such as diabetes and heart disease. What foods can I eat? Grains Whole  grains, such as whole wheat or whole grain breads, crackers, cereals, and pasta. Unsweetened oatmeal, bulgur, barley, quinoa, or brown rice. Corn or whole wheat flour tortillas. Vegetables Fresh or frozen vegetables (raw, steamed, roasted, or grilled). Green salads. Fruits All fresh, canned (in natural juice), or frozen fruits. Meat and Other Protein Products Ground beef (85% or leaner), grass-fed beef, or beef trimmed of fat. Skinless chicken or [place]. Ground chicken or [place]. Pork trimmed of fat. All fish and seafood. Eggs. Dried beans, peas, or lentils. Unsalted nuts or seeds. Unsalted canned or dry beans. Dairy Low-fat dairy products, such as skim or 1% milk, 2% or reduced-fat cheeses, low-fat ricotta or cottage cheese, or plain low-fat yogurt. Fats and Oils Tub margarines without trans fats. Light or reduced-fat mayonnaise and salad dressings. Avocado. Olive, canola, sesame, or safflower oils. Natural peanut or almond butter (choose ones without added sugar and oil). The items listed above may not be a complete list of recommended foods or beverages. Contact your dietitian for more options. What foods are not recommended? Grains White bread. White pasta. White rice. Cornbread. Bagels, pastries, and croissants. Crackers that contain trans fat. Vegetables White potatoes. Corn. Creamed or fried vegetables. Vegetables in a cheese sauce. Fruits Dried fruits. Canned fruit in light or heavy syrup. Fruit juice. Meat and Other Protein Products Fatty cuts of meat. Ribs, chicken wings, bacon, sausage, bologna, salami, chitterlings, fatback, hot dogs, bratwurst, and packaged luncheon meats. Liver and organ meats. Dairy Whole or 2% milk, cream, half-and-half, and cream cheese. Whole milk cheeses.  Whole-fat or sweetened yogurt. Full-fat cheeses. Nondairy creamers and whipped toppings. Processed cheese, cheese spreads, or cheese curds. Sweets and Desserts Corn syrup, sugars, honey, and molasses.  Candy. Jam and jelly. Syrup. Sweetened cereals. Cookies, pies, cakes, donuts, muffins, and ice cream. Fats and Oils Butter, stick margarine, lard, shortening, ghee, or bacon fat. Coconut, palm kernel, or palm oils. Beverages Alcohol. Sweetened drinks (such as sodas, lemonade, and fruit drinks or punches). The items listed above may not be a complete list of foods and beverages to avoid. Contact your dietitian for more information. This information is not intended to replace advice given to you by your health care provider. Make sure you discuss any questions you have with your health care provider. Document Released: 02/11/2012 Document Revised: 04/18/2016 Document Reviewed: 11/11/2013 Elsevier Interactive Patient Education  [employer].

## 2018-07-01 NOTE — Progress Notes (Signed)
Patient ID: Lydia Stevens, female    DOB: 31-Jul-1971, 47 y.o.   MRN: [id number]  PCP: Alycia Rossetti, MD  Chief Complaint  Patient presents with  . Follow-up    is fasting- started on cholesterol    Subjective:   Lydia Stevens is a 47 y.o. female, presents to clinic with CC of HLD follow up after starting simvastatin 10 mg about 6 weeks ago after CPE found very elevated cholesterol;  On Sept 16, 2019 total cholesterol was 277, LDL was 203.  Patient is taking the medicine most days but she is taking in the morning and she admits that sometimes she forgets to take it.  She has not had any side effects denies myalgias.     Patient Active Problem List   Diagnosis Date Noted  . Hyperlipidemia 05/18/2018  . Overweight (BMI 25.0-29.9) 05/02/2017  . Osteopenia determined by x-ray 08/18/2015  . Vitamin D deficiency 08/05/2015  . Scoliosis of lumbar spine 04/18/2015  . Situational depression 01/17/2015  . History of breast cancer 12/29/2014  . Acquired absence of breast and nipple 12/06/2014  . Genetic testing 07/15/2014  . Breast cancer of lower-inner quadrant of right female breast ([place]) 06/09/2014    Current Meds  Medication Sig  . anastrozole (ARIMIDEX) 1 MG tablet Take 1 tablet (1 mg total) by mouth daily.  Marland Kitchen BIOTIN PO Take 1 tablet by mouth daily.  . Calcium Carbonate-Vit D-Min (CALCIUM 1200 PO) Take by mouth.  . simvastatin (ZOCOR) 10 MG tablet Take 1 tablet (10 mg total) by mouth every evening.     Review of Systems  Constitutional: Negative.   HENT: Negative.   Eyes: Negative.   Respiratory: Negative.   Cardiovascular: Negative.   Gastrointestinal: Negative.   Endocrine: Negative.   Genitourinary: Negative.   Musculoskeletal: Negative.   Skin: Negative.   Allergic/Immunologic: Negative.   Neurological: Negative.   Hematological: Negative.   Psychiatric/Behavioral: Negative.   All other systems reviewed and are negative.      Objective:     Vitals:   07/01/18 0801  BP: 110/64  Pulse: 84  Resp: 14  Temp: 98.7 F (37.1 C)  TempSrc: Oral  SpO2: 100%  Weight: 184 lb (83.5 kg)  Height: 5' 7.5" (1.715 m)      Physical Exam  Constitutional: She appears well-developed and well-nourished. No distress.  HENT:  Head: Normocephalic and atraumatic.  Nose: Nose normal.  Mouth/Throat: Oropharynx is clear and moist.  Eyes: Conjunctivae are normal. Right eye exhibits no discharge. Left eye exhibits no discharge. No scleral icterus.  Neck: No tracheal deviation present.  Cardiovascular: Normal rate, regular rhythm, normal heart sounds and intact distal pulses. Exam reveals no gallop and no friction rub.  No murmur heard. Pulmonary/Chest: Effort normal and breath sounds normal. No stridor. No respiratory distress. She has no wheezes. She has no rales. She exhibits no tenderness.  Abdominal: Soft. Bowel sounds are normal. She exhibits no distension and no mass. There is no tenderness. There is no guarding.  Musculoskeletal: Normal range of motion.  Neurological: She is alert. She exhibits normal muscle tone. Coordination normal.  Skin: Skin is warm and dry. Capillary refill takes less than 2 seconds. No rash noted. She is not diaphoretic.  Psychiatric: She has a normal mood and affect. Her behavior is normal.  Nursing note and vitals reviewed.         Assessment & Plan:   [name] is a 47 year old female here for  recheck of new medication, simvastatin 10 mg which was started for hyperlipidemia found at her last physical getting labs.  Tolerating the medication without side effect.  Is here fasting repeated labs but I am unsure if a 6-week repeat labs will be covered by insurance, she has been taking the medicine inconsistently, in the morning and has been missing doses.  We will have her do FLP and CMP in 6 weeks from now.   Problem List Items Addressed This Visit      Other   Hyperlipidemia - Primary    Relevant Orders   COMPLETE METABOLIC PANEL WITH GFR   Lipid panel       I did review medication administration and sent a message to the patient.  She would prefer to take in the mornings and believes that that is the only way that she will be able to remember to take it, with her other medication.  Did explain how study showed that LDL is reduced more when taken at night, but I still think she would benefit more from just taking it daily and if she can only do that in the mornings I would still be better than nothing.  We will recheck in 6 weeks  Delsa Grana, PA-C 07/01/18 8:12 AM

## 2018-07-10 ENCOUNTER — Other Ambulatory Visit: Payer: Self-pay | Admitting: Physician Assistant

## 2018-07-10 DIAGNOSIS — E785 Hyperlipidemia, unspecified: Secondary | ICD-10-CM

## 2018-07-28 ENCOUNTER — Telehealth (HOSPITAL_COMMUNITY): Payer: Self-pay | Admitting: Hematology

## 2018-07-28 ENCOUNTER — Telehealth: Payer: Self-pay | Admitting: Family Medicine

## 2018-07-28 DIAGNOSIS — Z170 Estrogen receptor positive status [ER+]: Principal | ICD-10-CM

## 2018-07-28 DIAGNOSIS — C50311 Malignant neoplasm of lower-inner quadrant of right female breast: Secondary | ICD-10-CM

## 2018-07-28 NOTE — Telephone Encounter (Signed)
Received a voicemail from Lydia Stevens at [facility] requesting a referral for patient to be seen for her breast cancer. May we place referral? Patient already has and appointment with Dr. Delton Coombes on 08/17/18. Please advise?

## 2018-07-28 NOTE — Addendum Note (Signed)
Addended by: Launa Grill on: 07/28/2018 03:36 PM   Modules accepted: Orders

## 2018-07-28 NOTE — Telephone Encounter (Signed)
Referral placed.

## 2018-07-28 NOTE — Telephone Encounter (Signed)
LEFT VM WITH SHARON AT DR [facility] OFFICE REQUESTING A REF FOR PTS NEXT OV ON 12/23.

## 2018-07-28 NOTE — Telephone Encounter (Signed)
Okay to place referral

## 2018-07-30 ENCOUNTER — Telehealth (HOSPITAL_COMMUNITY): Payer: Self-pay | Admitting: Hematology

## 2018-07-30 NOTE — Telephone Encounter (Signed)
PC TO DR Janeann Forehand OFFICE AGAIN RE: [employer] REF FOR PTS VISIT ON 12/23 LEFT VM FOR SHARON TO RETURN MY CALL

## 2018-08-04 ENCOUNTER — Other Ambulatory Visit (HOSPITAL_COMMUNITY): Payer: Self-pay | Admitting: Nurse Practitioner

## 2018-08-04 DIAGNOSIS — R928 Other abnormal and inconclusive findings on diagnostic imaging of breast: Secondary | ICD-10-CM

## 2018-08-09 ENCOUNTER — Other Ambulatory Visit: Payer: Self-pay | Admitting: Family Medicine

## 2018-08-09 DIAGNOSIS — E785 Hyperlipidemia, unspecified: Secondary | ICD-10-CM

## 2018-08-10 ENCOUNTER — Other Ambulatory Visit (HOSPITAL_COMMUNITY): Payer: Self-pay

## 2018-08-10 DIAGNOSIS — M810 Age-related osteoporosis without current pathological fracture: Secondary | ICD-10-CM

## 2018-08-10 DIAGNOSIS — C50311 Malignant neoplasm of lower-inner quadrant of right female breast: Secondary | ICD-10-CM

## 2018-08-10 DIAGNOSIS — M8580 Other specified disorders of bone density and structure, unspecified site: Secondary | ICD-10-CM

## 2018-08-10 DIAGNOSIS — Z170 Estrogen receptor positive status [ER+]: Secondary | ICD-10-CM

## 2018-08-11 ENCOUNTER — Encounter (HOSPITAL_COMMUNITY): Payer: 59

## 2018-08-11 ENCOUNTER — Ambulatory Visit (HOSPITAL_COMMUNITY)
Admission: RE | Admit: 2018-08-11 | Discharge: 2018-08-11 | Disposition: A | Discharge status: Home / Self Care | Payer: 59 | Source: Ambulatory Visit | Attending: Nurse Practitioner | Admitting: Nurse Practitioner

## 2018-08-11 ENCOUNTER — Ambulatory Visit (HOSPITAL_COMMUNITY): Payer: 59

## 2018-08-11 DIAGNOSIS — R928 Other abnormal and inconclusive findings on diagnostic imaging of breast: Secondary | ICD-10-CM | POA: Diagnosis not present

## 2018-08-13 ENCOUNTER — Encounter (HOSPITAL_COMMUNITY): Payer: Self-pay | Admitting: Oncology

## 2018-08-17 ENCOUNTER — Inpatient Hospital Stay (HOSPITAL_BASED_OUTPATIENT_CLINIC_OR_DEPARTMENT_OTHER): Payer: 59 | Admitting: Hematology

## 2018-08-17 ENCOUNTER — Encounter (HOSPITAL_COMMUNITY): Payer: Self-pay | Admitting: Hematology

## 2018-08-17 ENCOUNTER — Other Ambulatory Visit (HOSPITAL_COMMUNITY)
Admission: RE | Admit: 2018-08-17 | Discharge: 2018-08-17 | Disposition: A | Discharge status: Home / Self Care | Payer: 59 | Source: Ambulatory Visit | Attending: Family Medicine | Admitting: Family Medicine

## 2018-08-17 ENCOUNTER — Inpatient Hospital Stay (HOSPITAL_COMMUNITY): Payer: 59

## 2018-08-17 ENCOUNTER — Inpatient Hospital Stay (HOSPITAL_COMMUNITY): Payer: 59 | Attending: Hematology

## 2018-08-17 ENCOUNTER — Other Ambulatory Visit: Payer: Self-pay

## 2018-08-17 ENCOUNTER — Other Ambulatory Visit: Payer: 59

## 2018-08-17 VITALS — BP 118/62 | HR 86 | Temp 98.4°F | Resp 16 | Wt 187.2 lb

## 2018-08-17 DIAGNOSIS — M8580 Other specified disorders of bone density and structure, unspecified site: Secondary | ICD-10-CM | POA: Diagnosis not present

## 2018-08-17 DIAGNOSIS — Z170 Estrogen receptor positive status [ER+]: Secondary | ICD-10-CM | POA: Diagnosis not present

## 2018-08-17 DIAGNOSIS — Z79811 Long term (current) use of aromatase inhibitors: Secondary | ICD-10-CM

## 2018-08-17 DIAGNOSIS — Z9011 Acquired absence of right breast and nipple: Secondary | ICD-10-CM

## 2018-08-17 DIAGNOSIS — C50311 Malignant neoplasm of lower-inner quadrant of right female breast: Secondary | ICD-10-CM

## 2018-08-17 DIAGNOSIS — Z9221 Personal history of antineoplastic chemotherapy: Secondary | ICD-10-CM | POA: Diagnosis not present

## 2018-08-17 DIAGNOSIS — E785 Hyperlipidemia, unspecified: Secondary | ICD-10-CM

## 2018-08-17 DIAGNOSIS — M810 Age-related osteoporosis without current pathological fracture: Secondary | ICD-10-CM

## 2018-08-17 LAB — COMPREHENSIVE METABOLIC PANEL
ALK PHOS: 91 U/L (ref 38–126)
ALT: 13 U/L (ref 0–44)
ALT: 13 U/L (ref 0–44)
AST: 19 U/L (ref 15–41)
AST: 17 U/L (ref 15–41)
Albumin: 4.5 g/dL (ref 3.5–5.0)
Albumin: 4.5 g/dL (ref 3.5–5.0)
Alkaline Phosphatase: 92 U/L (ref 38–126)
Anion gap: 9 (ref 5–15)
Anion gap: 7 (ref 5–15)
BILIRUBIN TOTAL: 0.7 mg/dL (ref 0.3–1.2)
BUN: 13 mg/dL (ref 6–20)
BUN: 14 mg/dL (ref 6–20)
CO2: 25 mmol/L (ref 22–32)
CO2: 28 mmol/L (ref 22–32)
Calcium: 10 mg/dL (ref 8.9–10.3)
Calcium: 9.9 mg/dL (ref 8.9–10.3)
Chloride: 101 mmol/L (ref 98–111)
Chloride: 100 mmol/L (ref 98–111)
Creatinine, Ser: 0.94 mg/dL (ref 0.44–1.00)
Creatinine, Ser: 0.88 mg/dL (ref 0.44–1.00)
GFR calc Af Amer: >60 mL/min (ref >60)
GFR calc Af Amer: >60 mL/min (ref >60)
GFR calc non Af Amer: >60 mL/min (ref >60)
GFR calc non Af Amer: >60 mL/min (ref >60)
Glucose, Bld: 82 mg/dL (ref 70–99)
Glucose, Bld: 89 mg/dL (ref 70–99)
Potassium: 3.8 mmol/L (ref 3.5–5.1)
Potassium: 3.4 mmol/L — ABNORMAL LOW (ref 3.5–5.1)
Sodium: 135 mmol/L (ref 135–145)
Sodium: 135 mmol/L (ref 135–145)
TOTAL PROTEIN: 8.4 g/dL — AB (ref 6.5–8.1)
TOTAL PROTEIN: 8.4 g/dL — AB (ref 6.5–8.1)
Total Bilirubin: 0.6 mg/dL (ref 0.3–1.2)

## 2018-08-17 LAB — CBC WITH DIFFERENTIAL/PLATELET
ABS IMMATURE GRANULOCYTES: 0.04 10*3/uL (ref 0.00–0.07)
Basophils Absolute: 0 10*3/uL (ref 0.0–0.1)
Basophils Relative: 0 %
Eosinophils Absolute: 0.1 10*3/uL (ref 0.0–0.5)
Eosinophils Relative: 1 %
HCT: 39.9 % (ref 36.0–46.0)
HEMOGLOBIN: 12.7 g/dL (ref 12.0–15.0)
Immature Granulocytes: 0 %
LYMPHS ABS: 2.6 10*3/uL (ref 0.7–4.0)
Lymphocytes Relative: 28 %
MCH: 27.7 pg (ref 26.0–34.0)
MCHC: 31.8 g/dL (ref 30.0–36.0)
MCV: 86.9 fL (ref 80.0–100.0)
Monocytes Absolute: 0.4 10*3/uL (ref 0.1–1.0)
Monocytes Relative: 5 %
Neutro Abs: 6.2 10*3/uL (ref 1.7–7.7)
Neutrophils Relative %: 66 %
Platelets: 278 10*3/uL (ref 150–400)
RBC: 4.59 MIL/uL (ref 3.87–5.11)
RDW: 15.4 % (ref 11.5–15.5)
WBC: 9.4 10*3/uL (ref 4.0–10.5)
nRBC: 0 % (ref 0.0–0.2)

## 2018-08-17 LAB — LIPID PANEL
Cholesterol: 197 mg/dL (ref 0–200)
HDL: 55 mg/dL (ref >40)
LDL Cholesterol: 123 mg/dL — ABNORMAL HIGH (ref 0–99)
TRIGLYCERIDES: 93 mg/dL (ref <150)
Total CHOL/HDL Ratio: 3.6 RATIO
VLDL: 19 mg/dL (ref 0–40)

## 2018-08-17 MED ORDER — DENOSUMAB 60 MG/ML ~~LOC~~ SOSY
60.0000 mg | PREFILLED_SYRINGE | Freq: Once | SUBCUTANEOUS | Status: AC
  Administered 2018-08-17: 60 mg via SUBCUTANEOUS
  Filled 2018-08-17: qty 1

## 2018-08-17 NOTE — Progress Notes (Signed)
Lydia Stevens presents today for injection per the provider's orders.  Prolia administration without incident; see MAR for injection details.  Patient tolerated procedure well and without incident.  No questions or complaints noted at this time.Discharged ambulatory

## 2018-08-17 NOTE — Assessment & Plan Note (Signed)
1.  Stage I right breast IDC: - Status post right mastectomy followed by TRAM flap in December 2015, 2 cm IDC, grade 3, 2 sentinel lymph node negative, Ki-67 of 33%, ER/PR positive, HER-2 negative - She is continuing to tolerate anastrozole very well. -Physical examination today did not reveal any palpable masses or adenopathy. - Left breast mammogram on 02/03/2018 was BI-RADS Category 3.  I have reviewed repeat mammogram of the left breast dated 08/11/2018 which was BI-RADS Category 2. -She will come back in 6 months for follow-up.  2.  Osteopenia: -DEXA scan on 07/29/2017 with T score of -0.8. -Prolia every 6 months started in December 2016. -She received Prolia today.  She will continue calcium and vitamin D supplements.

## 2018-08-17 NOTE — Progress Notes (Signed)
[place], [place] [address]   CLINIC:  Medical Oncology/Hematology  PCP:  Alycia Rossetti, MD [address] [place] [address] [phone]   REASON FOR VISIT: Follow-up for right breast cancer ER+/PR+/HER 2-  CURRENT THERAPY: Arimidex  BRIEF ONCOLOGIC HISTORY:    Breast cancer of lower-inner quadrant of right female breast ([place])   06/07/2014 Initial Biopsy    Right breast needle biopsy 5:00 position: Invasive ductal carcinoma with DCIS, ER 100%, PR 71%, Ki-67 33%, HER-2 negative ratio 1.03    06/17/2014 Breast MRI    Right breast: 10 x 7 x 5 mm biopsy-proven IDC with DCIS, left breast 7 x 7 x 7 mm fibroadenoma, left upper quadrant of the abdomen abutting the peritoneum 3 x 1.1 cm oval soft tissue mass    07/27/2014 Surgery    Right breast mastectomy: Invasive ductal carcinoma grade 3, 2 cm, intermediate grade DCIS, lymphovascular invasion identified, 2 SLN negative, T1 C. N0 M0 stage IA, ER positive, PR 7%, HER-2 negative, Ki-67 33%    07/27/2014 Oncotype testing    Recurrence Score of 24, placing patient in the intermediate risk group    09/06/2014 -  Chemotherapy    Taxotere/Cytoxan     09/08/2014 Adverse Reaction    Nasuea and voming three times x 2 days.  Added Aloxi and Emend to anti-emetic regimen.    10/18/2014 -  Chemotherapy    Zoladex    10/27/2014 Adverse Reaction    Hives, secondary to Zoladex?    02/16/2015 Surgery    Laparoscopic BSO with Dr. Alycia Rossetti      CANCER STAGING: Cancer Staging Breast cancer of lower-inner quadrant of right female breast [facility]) Staging form: Breast, AJCC 7th Edition - Clinical: Stage IA (T1b, N0, cM0) - Unsigned - Pathologic: Stage IA (T1c, N0, cM0) - Signed by Seward Grater, MD on 09/13/2014    INTERVAL HISTORY:  Lydia Stevens 47 y.o. female returns for routine follow-up for right breast cancer. She is here today and doing well tolerating her Arimidex. She has no complaints  at this time. Denies any nausea, vomiting, or diarrhea. Denies any new pains. Had not noticed any recent bleeding such as epistaxis, hematuria or hematochezia. Denies recent chest pain on exertion, shortness of breath on minimal exertion, pre-syncopal episodes, or palpitations. Denies any numbness or tingling in hands or feet. Denies any recent fevers, infections, or recent hospitalizations. Denies any jaw pains. She reports her appetite and energy level at 100%. She lives at home and performs all her own ADLs and activities. She is still working full time.      REVIEW OF SYSTEMS:  Review of Systems  All other systems reviewed and are negative.    PAST MEDICAL/SURGICAL HISTORY:  Past Medical History:  Diagnosis Date  . Anemia    before   . Blood transfusion without reported diagnosis   . Breast cancer ([place])    2015  . GERD (gastroesophageal reflux disease)   . History of breast cancer 12/29/2014  . Hives Mar 3rd 2016  . Hyperlipidemia   . Osteoporosis 08/18/2015  . Scoliosis    Past Surgical History:  Procedure Laterality Date  . BREAST RECONSTRUCTION Right 12/06/2014   Procedure: RIGHT NIPPLE AREOLAR RECONSTRUCTION WITH FULL THICKNESS SKIN GRAFT FROM RIGHT THIGH;  Surgeon: Irene Limbo, MD;  Location: [place];  Service: Plastics;  Laterality: Right;  . BREAST REDUCTION SURGERY Left 12/06/2014   Procedure: LEFT  BREAST REDUCTION FOR ASYMMETRY;  Surgeon: Irene Limbo, MD;  Location: [place];  Service: Plastics;  Laterality: Left;  . CESAREAN SECTION  1996  . LATISSIMUS FLAP TO BREAST Right 07/27/2014   Procedure: TRAM FLAP RECONSTUCTION RIGHT CHEST;  Surgeon: Irene Limbo, MD;  Location: [place];  Service: Plastics;  Laterality: Right;  . MASTECTOMY W/ SENTINEL NODE BIOPSY Right 07/27/2014   Procedure: RIGHT MASTECTOMY WITH RIGHT AXILLARY SENTINEL LYMPH NODE BIOPSY;  Surgeon: Alphonsa Overall, MD;  Location: [facility];  Service: General;  Laterality:  Right;  . OOPHORECTOMY  2016  . PORT-A-CATH REMOVAL Left 12/06/2014   Procedure: REMOVAL PORT-A-CATH;  Surgeon: Irene Limbo, MD;  Location: [place];  Service: Plastics;  Laterality: Left;  . PORTACATH PLACEMENT Left 09/05/2013  . WISDOM TOOTH EXTRACTION       SOCIAL HISTORY:  Social History   Socioeconomic History  . Marital status: Married    Spouse name: Not on file  . Number of children: 1  . Years of education: Not on file  . Highest education level: Not on file  Occupational History  . Not on file  Social Needs  . Financial resource strain: Not on file  . Food insecurity:    Worry: Not on file    Inability: Not on file  . Transportation needs:    Medical: Not on file    Non-medical: Not on file  Tobacco Use  . Smoking status: Never Smoker  . Smokeless tobacco: Never Used  Substance and Sexual Activity  . Alcohol use: No    Alcohol/week: 0.0 standard drinks  . Drug use: No  . Sexual activity: Yes    Birth control/protection: None  Lifestyle  . Physical activity:    Days per week: Not on file    Minutes per session: Not on file  . Stress: Not on file  Relationships  . Social connections:    Talks on phone: Not on file    Gets together: Not on file    Attends religious service: Not on file    Active member of club or organization: Not on file    Attends meetings of clubs or organizations: Not on file    Relationship status: Not on file  . Intimate partner violence:    Fear of current or ex partner: Not on file    Emotionally abused: Not on file    Physically abused: Not on file    Forced sexual activity: Not on file  Other Topics Concern  . Not on file  Social History Narrative  . Not on file    FAMILY HISTORY:  Family History  Problem Relation Age of Onset  . Heart attack Father   . Arthritis Father   . Heart disease Father        Massive MI  . Breast cancer Maternal Aunt 45  . Breast cancer Sister 68  . Cancer Sister   .  Breast cancer Maternal Aunt 105  . Arthritis Mother   . Hypertension Mother   . Asthma Sister   . Alcohol abuse Brother   . Hypertension Brother   . Cancer Sister        1/2 - breast cancer  . Breast cancer Paternal Aunt 83    CURRENT MEDICATIONS:  Outpatient Encounter Medications as of 08/17/2018  Medication Sig  . anastrozole (ARIMIDEX) 1 MG tablet Take 1 tablet (1 mg total) by mouth daily.  Marland Kitchen BIOTIN PO Take 1 tablet by mouth daily.  Marland Kitchen  Calcium Carbonate-Vit D-Min (CALCIUM 1200 PO) Take by mouth.  . simvastatin (ZOCOR) 10 MG tablet TAKE 1 TABLET(10 MG) BY MOUTH EVERY EVENING   Facility-Administered Encounter Medications as of 08/17/2018  Medication  . [COMPLETED] denosumab (PROLIA) injection 60 mg  . diphenhydrAMINE (BENADRYL) injection 50 mg    ALLERGIES:  Allergies  Allergen Reactions  . Oxycodone Nausea Only     PHYSICAL EXAM:  ECOG Performance status: 1  Vitals:   08/17/18 1511  BP: 118/62  Pulse: 86  Resp: 16  Temp: 98.4 F (36.9 C)  SpO2: 97%   Filed Weights   08/17/18 1511  Weight: 187 lb 3.2 oz (84.9 kg)    Physical Exam Constitutional:      Appearance: Normal appearance. She is normal weight.  Musculoskeletal: Normal range of motion.  Skin:    General: Skin is warm and dry.  Neurological:     Mental Status: She is alert and oriented to person, place, and time. Mental status is at baseline.  Psychiatric:        Mood and Affect: Mood normal.        Behavior: Behavior normal.        Thought Content: Thought content normal.        Judgment: Judgment normal.   Breast: RIGHT: Implant in place.  No palpable masses, no skin changes or no adenopathy.              LEFT: No palpable masses, no skin changes or nipple discharge, no adenopathy.    LABORATORY DATA:  I have reviewed the labs as listed.  CBC    Component Value Date/Time   WBC 9.4 08/17/2018 1326   RBC 4.59 08/17/2018 1326   HGB 12.7 08/17/2018 1326   HGB 11.7 09/05/2014 0914   HCT  39.9 08/17/2018 1326   HCT 35.4 09/05/2014 0914   PLT 278 08/17/2018 1326   PLT 297 09/05/2014 0914   MCV 86.9 08/17/2018 1326   MCV 84.9 09/05/2014 0914   MCH 27.7 08/17/2018 1326   MCHC 31.8 08/17/2018 1326   RDW 15.4 08/17/2018 1326   RDW 14.9 (H) 09/05/2014 0914   LYMPHSABS 2.6 08/17/2018 1326   LYMPHSABS 1.6 09/05/2014 0914   MONOABS 0.4 08/17/2018 1326   MONOABS 0.5 09/05/2014 0914   EOSABS 0.1 08/17/2018 1326   EOSABS 0.2 09/05/2014 0914   BASOSABS 0.0 08/17/2018 1326   BASOSABS 0.0 09/05/2014 0914   CMP Latest Ref Rng & Units 08/17/2018 08/17/2018 05/11/2018  Glucose 70 - 99 mg/dL 89 82 88  BUN 6 - 20 mg/dL 14 13 21   Creatinine 0.44 - 1.00 mg/dL 0.88 0.94 1.19(H)  Sodium 135 - 145 mmol/L 135 135 145  Potassium 3.5 - 5.1 mmol/L 3.4(L) 3.8 4.4  Chloride 98 - 111 mmol/L 100 101 101  CO2 22 - 32 mmol/L 28 25 23   Calcium 8.9 - 10.3 mg/dL 9.9 10.0 11.2(H)  Total Protein 6.5 - 8.1 g/dL 8.4(H) 8.4(H) 8.3(H)  Total Bilirubin 0.3 - 1.2 mg/dL 0.6 0.7 0.5  Alkaline Phos 38 - 126 U/L 92 91 -  AST 15 - 41 U/L 17 19 16   ALT 0 - 44 U/L 13 13 11        DIAGNOSTIC IMAGING:  I have independently reviewed the scans and discussed with the patient.   I have reviewed Lydia Finders, NP's note and agree with the documentation.  I personally performed a face-to-face visit, made revisions and my assessment and plan is as follows.  ASSESSMENT & PLAN:   Breast cancer of lower-inner quadrant of right female breast 1.  Stage I right breast IDC: - Status post right mastectomy followed by TRAM flap in December 2015, 2 cm IDC, grade 3, 2 sentinel lymph node negative, Ki-67 of 33%, ER/PR positive, HER-2 negative - She is continuing to tolerate anastrozole very well. -Physical examination today did not reveal any palpable masses or adenopathy. - Left breast mammogram on 02/03/2018 was BI-RADS Category 3.  I have reviewed repeat mammogram of the left breast dated 08/11/2018 which was BI-RADS  Category 2. -She will come back in 6 months for follow-up.  2.  Osteopenia: -DEXA scan on 07/29/2017 with T score of -0.8. -Prolia every 6 months started in December 2016. -She received Prolia today.  She will continue calcium and vitamin D supplements.        Orders placed this encounter:  Orders Placed This Encounter  Procedures  . CBC with Differential/Platelet  . Comprehensive metabolic panel      Derek Jack, MD [place] [phone]

## 2018-08-17 NOTE — Patient Instructions (Signed)
[facility] Discharge Instructions     Thank you for choosing  Cancer Center at [facility] to provide your oncology and hematology care.  To afford each patient quality time with our provider, please arrive at least 15 minutes before your scheduled appointment time.   If you have a lab appointment with the Cancer Center please come in thru the  Main Entrance and check in at the main information desk  You need to re-schedule your appointment should you arrive 10 or more minutes late.  We strive to give you quality time with our providers, and arriving late affects you and other patients whose appointments are after yours.  Also, if you no show three or more times for appointments you may be dismissed from the clinic at the providers discretion.     Again, thank you for choosing [facility].  Our hope is that these requests will decrease the amount of time that you wait before being seen by our physicians.       _____________________________________________________________  Should you have questions after your visit to [facility], please contact our office at [phone] between the hours of 8:00 a.m. and 4:30 p.m.  Voicemails left after 4:00 p.m. will not be returned until the following business day.  For prescription refill requests, have your pharmacy contact our office and allow 72 hours.    Cancer Center Support Programs:   > Cancer Support Group  2nd Tuesday of the month 1pm-2pm, Journey Room    

## 2018-08-20 ENCOUNTER — Other Ambulatory Visit: Payer: Self-pay | Admitting: Family Medicine

## 2018-08-20 DIAGNOSIS — E785 Hyperlipidemia, unspecified: Secondary | ICD-10-CM

## 2018-10-07 ENCOUNTER — Other Ambulatory Visit (HOSPITAL_COMMUNITY): Payer: Self-pay | Admitting: Hematology

## 2018-10-12 ENCOUNTER — Telehealth (HOSPITAL_COMMUNITY): Payer: Self-pay | Admitting: *Deleted

## 2018-10-12 ENCOUNTER — Other Ambulatory Visit (HOSPITAL_COMMUNITY): Payer: Self-pay | Admitting: *Deleted

## 2018-10-12 MED ORDER — ANASTROZOLE 1 MG PO TABS: 1.0000 mg | ORAL_TABLET | Freq: Every day | ORAL | 5 refills | Status: DC

## 2018-10-12 NOTE — Telephone Encounter (Signed)
Chart reviewed, anastrozole refilled.

## 2018-11-06 ENCOUNTER — Other Ambulatory Visit: Payer: Self-pay | Admitting: Family Medicine

## 2018-11-06 DIAGNOSIS — E785 Hyperlipidemia, unspecified: Secondary | ICD-10-CM

## 2018-12-18 ENCOUNTER — Ambulatory Visit: Payer: 59 | Admitting: Family Medicine

## 2019-01-27 ENCOUNTER — Other Ambulatory Visit: Payer: Self-pay | Admitting: Family Medicine

## 2019-01-27 DIAGNOSIS — E785 Hyperlipidemia, unspecified: Secondary | ICD-10-CM

## 2019-02-16 ENCOUNTER — Other Ambulatory Visit: Payer: Self-pay

## 2019-02-16 ENCOUNTER — Inpatient Hospital Stay (HOSPITAL_COMMUNITY): Payer: 59 | Attending: Hematology

## 2019-02-16 DIAGNOSIS — Z79899 Other long term (current) drug therapy: Secondary | ICD-10-CM | POA: Diagnosis not present

## 2019-02-16 DIAGNOSIS — M8580 Other specified disorders of bone density and structure, unspecified site: Secondary | ICD-10-CM | POA: Diagnosis not present

## 2019-02-16 DIAGNOSIS — Z9011 Acquired absence of right breast and nipple: Secondary | ICD-10-CM | POA: Diagnosis not present

## 2019-02-16 DIAGNOSIS — C50311 Malignant neoplasm of lower-inner quadrant of right female breast: Secondary | ICD-10-CM | POA: Diagnosis not present

## 2019-02-16 DIAGNOSIS — Z170 Estrogen receptor positive status [ER+]: Secondary | ICD-10-CM | POA: Diagnosis not present

## 2019-02-16 DIAGNOSIS — Z79811 Long term (current) use of aromatase inhibitors: Secondary | ICD-10-CM | POA: Diagnosis not present

## 2019-02-16 LAB — CBC WITH DIFFERENTIAL/PLATELET
Abs Immature Granulocytes: 0.01 10*3/uL (ref 0.00–0.07)
Basophils Absolute: 0 10*3/uL (ref 0.0–0.1)
Basophils Relative: 1 %
Eosinophils Absolute: 0.1 10*3/uL (ref 0.0–0.5)
Eosinophils Relative: 2 %
HCT: 38.5 % (ref 36.0–46.0)
Hemoglobin: 12.4 g/dL (ref 12.0–15.0)
Immature Granulocytes: 0 %
Lymphocytes Relative: 29 %
Lymphs Abs: 2.1 10*3/uL (ref 0.7–4.0)
MCH: 27.8 pg (ref 26.0–34.0)
MCHC: 32.2 g/dL (ref 30.0–36.0)
MCV: 86.3 fL (ref 80.0–100.0)
Monocytes Absolute: 0.4 10*3/uL (ref 0.1–1.0)
Monocytes Relative: 6 %
Neutro Abs: 4.6 10*3/uL (ref 1.7–7.7)
Neutrophils Relative %: 62 %
Platelets: 250 10*3/uL (ref 150–400)
RBC: 4.46 MIL/uL (ref 3.87–5.11)
RDW: 15 % (ref 11.5–15.5)
WBC: 7.3 10*3/uL (ref 4.0–10.5)
nRBC: 0 % (ref 0.0–0.2)

## 2019-02-16 LAB — COMPREHENSIVE METABOLIC PANEL
ALT: 8 U/L (ref 0–44)
AST: 13 U/L — ABNORMAL LOW (ref 15–41)
Albumin: 4.1 g/dL (ref 3.5–5.0)
Alkaline Phosphatase: 65 U/L (ref 38–126)
Anion gap: 7 (ref 5–15)
BUN: 15 mg/dL (ref 6–20)
CO2: 29 mmol/L (ref 22–32)
Calcium: 9.7 mg/dL (ref 8.9–10.3)
Chloride: 104 mmol/L (ref 98–111)
Creatinine, Ser: 0.97 mg/dL (ref 0.44–1.00)
GFR calc Af Amer: >60 mL/min (ref >60)
GFR calc non Af Amer: >60 mL/min (ref >60)
Glucose, Bld: 102 mg/dL — ABNORMAL HIGH (ref 70–99)
Potassium: 4.2 mmol/L (ref 3.5–5.1)
Sodium: 140 mmol/L (ref 135–145)
Total Bilirubin: 0.6 mg/dL (ref 0.3–1.2)
Total Protein: 7.7 g/dL (ref 6.5–8.1)

## 2019-02-17 ENCOUNTER — Inpatient Hospital Stay (HOSPITAL_COMMUNITY): Payer: 59

## 2019-02-17 ENCOUNTER — Inpatient Hospital Stay (HOSPITAL_BASED_OUTPATIENT_CLINIC_OR_DEPARTMENT_OTHER): Payer: 59 | Admitting: Hematology

## 2019-02-17 ENCOUNTER — Encounter (HOSPITAL_COMMUNITY): Payer: Self-pay

## 2019-02-17 ENCOUNTER — Encounter (HOSPITAL_COMMUNITY): Payer: Self-pay | Admitting: Hematology

## 2019-02-17 DIAGNOSIS — C50311 Malignant neoplasm of lower-inner quadrant of right female breast: Secondary | ICD-10-CM | POA: Diagnosis not present

## 2019-02-17 DIAGNOSIS — Z170 Estrogen receptor positive status [ER+]: Secondary | ICD-10-CM

## 2019-02-17 DIAGNOSIS — Z9011 Acquired absence of right breast and nipple: Secondary | ICD-10-CM

## 2019-02-17 DIAGNOSIS — Z79811 Long term (current) use of aromatase inhibitors: Secondary | ICD-10-CM

## 2019-02-17 DIAGNOSIS — M8580 Other specified disorders of bone density and structure, unspecified site: Secondary | ICD-10-CM

## 2019-02-17 DIAGNOSIS — Z79899 Other long term (current) drug therapy: Secondary | ICD-10-CM

## 2019-02-17 MED ORDER — DENOSUMAB 60 MG/ML ~~LOC~~ SOSY
60.0000 mg | PREFILLED_SYRINGE | Freq: Once | SUBCUTANEOUS | Status: AC
  Administered 2019-02-17: 60 mg via SUBCUTANEOUS

## 2019-02-17 MED ORDER — DENOSUMAB 60 MG/ML ~~LOC~~ SOSY
PREFILLED_SYRINGE | SUBCUTANEOUS | Status: AC
  Filled 2019-02-17: qty 1

## 2019-02-17 NOTE — Progress Notes (Signed)
[place], [place] [address]   CLINIC:  Medical Oncology/Hematology  PCP:  Alycia Rossetti, MD [address] [place] [address] [phone]   REASON FOR VISIT:  Follow-up for Breast Cancer  CURRENT THERAPY: Aromatase Inhibitor  BRIEF ONCOLOGIC HISTORY:  Oncology History  Breast cancer of lower-inner quadrant of right female breast ([facility])  06/07/2014 Initial Biopsy   Right breast needle biopsy 5:00 position: Invasive ductal carcinoma with DCIS, ER 100%, PR 71%, Ki-67 33%, HER-2 negative ratio 1.03   06/17/2014 Breast MRI   Right breast: 10 x 7 x 5 mm biopsy-proven IDC with DCIS, left breast 7 x 7 x 7 mm fibroadenoma, left upper quadrant of the abdomen abutting the peritoneum 3 x 1.1 cm oval soft tissue mass   07/27/2014 Surgery   Right breast mastectomy: Invasive ductal carcinoma grade 3, 2 cm, intermediate grade DCIS, lymphovascular invasion identified, 2 SLN negative, T1 C. N0 M0 stage IA, ER positive, PR 7%, HER-2 negative, Ki-67 33%   07/27/2014 Oncotype testing   Recurrence Score of 24, placing patient in the intermediate risk group   09/06/2014 -  Chemotherapy   Taxotere/Cytoxan    09/08/2014 Adverse Reaction   Nasuea and voming three times x 2 days.  Added Aloxi and Emend to anti-emetic regimen.   10/18/2014 -  Chemotherapy   Zoladex   10/27/2014 Adverse Reaction   Hives, secondary to Zoladex?   02/16/2015 Surgery   Laparoscopic BSO with Dr. Alycia Rossetti      CANCER STAGING: Cancer Staging Breast cancer of lower-inner quadrant of right female breast [facility]) Staging form: Breast, AJCC 7th Edition - Clinical: Stage IA (T1b, N0, cM0) - Unsigned - Pathologic: Stage IA (T1c, N0, cM0) - Signed by Seward Grater, MD on 09/13/2014    INTERVAL HISTORY:  Ms. Elizardo 48 y.o. female seen for follow-up of breast cancer.  Reports overall doing well. Denies any significant fatigue. Currently on anastrazole daily.  Reports  tolerable hot flashes. Denies any myalgias. Denies any changes in her left breast or right breast mastectomy site.  Appetite and energy levels are 100%.  Denies any hospitalizations or ER visits.Marland Kitchen    REVIEW OF SYSTEMS:  Review of Systems  All other systems reviewed and are negative.    PAST MEDICAL/SURGICAL HISTORY:  Past Medical History:  Diagnosis Date  . Anemia    before   . Blood transfusion without reported diagnosis   . Breast cancer ([facility])    2015  . GERD (gastroesophageal reflux disease)   . History of breast cancer 12/29/2014  . Hives Mar 3rd 2016  . Hyperlipidemia   . Osteoporosis 08/18/2015  . Scoliosis    Past Surgical History:  Procedure Laterality Date  . BREAST RECONSTRUCTION Right 12/06/2014   Procedure: RIGHT NIPPLE AREOLAR RECONSTRUCTION WITH FULL THICKNESS SKIN GRAFT FROM RIGHT THIGH;  Surgeon: Irene Limbo, MD;  Location: [facility];  Service: Plastics;  Laterality: Right;  . BREAST REDUCTION SURGERY Left 12/06/2014   Procedure: LEFT BREAST REDUCTION FOR ASYMMETRY;  Surgeon: Irene Limbo, MD;  Location: [facility];  Service: Plastics;  Laterality: Left;  . CESAREAN SECTION  1996  . LATISSIMUS FLAP TO BREAST Right 07/27/2014   Procedure: TRAM FLAP RECONSTUCTION RIGHT CHEST;  Surgeon: Irene Limbo, MD;  Location: [facility];  Service: Plastics;  Laterality: Right;  . MASTECTOMY W/ SENTINEL NODE BIOPSY Right 07/27/2014   Procedure: RIGHT MASTECTOMY WITH RIGHT AXILLARY SENTINEL LYMPH NODE  BIOPSY;  Surgeon: Alphonsa Overall, MD;  Location: [facility];  Service: General;  Laterality: Right;  . OOPHORECTOMY  2016  . PORT-A-CATH REMOVAL Left 12/06/2014   Procedure: REMOVAL PORT-A-CATH;  Surgeon: Irene Limbo, MD;  Location: [facility];  Service: Plastics;  Laterality: Left;  . PORTACATH PLACEMENT Left 09/05/2013  . WISDOM TOOTH EXTRACTION       SOCIAL HISTORY:  Social History   Socioeconomic History  . Marital status:  Married    Spouse name: Not on file  . Number of children: 1  . Years of education: Not on file  . Highest education level: Not on file  Occupational History  . Not on file  Social Needs  . Financial resource strain: Not on file  . Food insecurity    Worry: Not on file    Inability: Not on file  . Transportation needs    Medical: Not on file    Non-medical: Not on file  Tobacco Use  . Smoking status: Never Smoker  . Smokeless tobacco: Never Used  Substance and Sexual Activity  . Alcohol use: No    Alcohol/week: 0.0 standard drinks  . Drug use: No  . Sexual activity: Yes    Birth control/protection: None  Lifestyle  . Physical activity    Days per week: Not on file    Minutes per session: Not on file  . Stress: Not on file  Relationships  . Social Herbalist on phone: Not on file    Gets together: Not on file    Attends religious service: Not on file    Active member of club or organization: Not on file    Attends meetings of clubs or organizations: Not on file    Relationship status: Not on file  . Intimate partner violence    Fear of current or ex partner: Not on file    Emotionally abused: Not on file    Physically abused: Not on file    Forced sexual activity: Not on file  Other Topics Concern  . Not on file  Social History Narrative  . Not on file    FAMILY HISTORY:  Family History  Problem Relation Age of Onset  . Heart attack Father   . Arthritis Father   . Heart disease Father        Massive MI  . Breast cancer Maternal Aunt 17  . Breast cancer Sister 45  . Cancer Sister   . Breast cancer Maternal Aunt 20  . Arthritis Mother   . Hypertension Mother   . Asthma Sister   . Alcohol abuse Brother   . Hypertension Brother   . Cancer Sister        1/2 - breast cancer  . Breast cancer Paternal Aunt 37    CURRENT MEDICATIONS:  Outpatient Encounter Medications as of 02/17/2019  Medication Sig  . anastrozole (ARIMIDEX) 1 MG tablet Take 1  tablet (1 mg total) by mouth daily.  . Calcium Carbonate-Vit D-Min (CALCIUM 1200 PO) Take by mouth.  . simvastatin (ZOCOR) 10 MG tablet TAKE 1 TABLET(10 MG) BY MOUTH EVERY EVENING  . BIOTIN PO Take 1 tablet by mouth daily.   Facility-Administered Encounter Medications as of 02/17/2019  Medication  . diphenhydrAMINE (BENADRYL) injection 50 mg    ALLERGIES:  Allergies  Allergen Reactions  . Oxycodone Nausea Only     PHYSICAL EXAM:  ECOG Performance status: 1  Vitals:   02/17/19 0808  BP: 111/67  Pulse: 78  Resp: 18  Temp: 98.5 F (36.9 C)  SpO2: 95%   Filed Weights   02/17/19 0808  Weight: 188 lb 6.4 oz (85.5 kg)    Physical Exam Constitutional:      Appearance: Normal appearance. She is obese.  HENT:     Head: Normocephalic.     Nose: Nose normal.     Mouth/Throat:     Mouth: Mucous membranes are moist.     Pharynx: Oropharynx is clear.  Eyes:     Extraocular Movements: Extraocular movements intact.     Conjunctiva/sclera: Conjunctivae normal.  Neck:     Musculoskeletal: Normal range of motion.  Cardiovascular:     Rate and Rhythm: Normal rate and regular rhythm.     Pulses: Normal pulses.     Heart sounds: Normal heart sounds.  Pulmonary:     Effort: Pulmonary effort is normal.     Breath sounds: Normal breath sounds.  Abdominal:     General: Bowel sounds are normal.     Palpations: Abdomen is soft.  Musculoskeletal: Normal range of motion.  Skin:    General: Skin is warm and dry.  Neurological:     General: No focal deficit present.     Mental Status: She is alert and oriented to person, place, and time.  Psychiatric:        Mood and Affect: Mood normal.        Behavior: Behavior normal.        Thought Content: Thought content normal.        Judgment: Judgment normal.    Right breast TRAM flap site is within normal limits.  Left breast has no palpable masses.  No palpable adenopathy.  LABORATORY DATA:  I have reviewed the labs as listed.  CBC     Component Value Date/Time   WBC 7.3 02/16/2019 0811   RBC 4.46 02/16/2019 0811   HGB 12.4 02/16/2019 0811   HGB 11.7 09/05/2014 0914   HCT 38.5 02/16/2019 0811   HCT 35.4 09/05/2014 0914   PLT 250 02/16/2019 0811   PLT 297 09/05/2014 0914   MCV 86.3 02/16/2019 0811   MCV 84.9 09/05/2014 0914   MCH 27.8 02/16/2019 0811   MCHC 32.2 02/16/2019 0811   RDW 15.0 02/16/2019 0811   RDW 14.9 (H) 09/05/2014 0914   LYMPHSABS 2.1 02/16/2019 0811   LYMPHSABS 1.6 09/05/2014 0914   MONOABS 0.4 02/16/2019 0811   MONOABS 0.5 09/05/2014 0914   EOSABS 0.1 02/16/2019 0811   EOSABS 0.2 09/05/2014 0914   BASOSABS 0.0 02/16/2019 0811   BASOSABS 0.0 09/05/2014 0914   CMP Latest Ref Rng & Units 02/16/2019 08/17/2018 08/17/2018  Glucose 70 - 99 mg/dL 102(H) 89 82  BUN 6 - 20 mg/dL _0 Creatinine 0.44 - 1.00 mg/dL 0.97 0.88 0.94  Sodium 135 - 145 mmol/L 140 135 135  Potassium 3.5 - 5.1 mmol/L 4.2 3.4(L) 3.8  Chloride 98 - 111 mmol/L 104 100 101  CO2 22 - 32 mmol/L _1 Calcium 8.9 - 10.3 mg/dL 9.7 9.9 10.0  Total Protein 6.5 - 8.1 g/dL 7.7 8.4(H) 8.4(H)  Total Bilirubin 0.3 - 1.2 mg/dL 0.6 0.6 0.7  Alkaline Phos 38 - 126 U/L 65 92 91  AST 15 - 41 U/L 13(L) 17 19  ALT 0 - 44 U/L _2 ASSESSMENT & PLAN:   Breast cancer of lower-inner quadrant of right female breast 1.  Stage I (PT1CPN0) right breast IDC: -Status post right mastectomy followed by TRAM flap in December 2015, 2 cm IDC, grade 3, 2 sentinel lymph node negative, ER/PR positive, HER-2 negative, Ki-67 33%. -Oncotype DX recurrence score of 24. -4 cycles of TC from 09/06/2014 through 11/08/2014. - Patient currently taking anastrozole.  She is tolerating it very well without any adverse effects. - Left breast mammogram on 08/11/2018 shows no evidence of malignancy.  Screening mammogram in 1 year recommended. - Today's physical exam did not reveal any evidence of disease. Plan to repeat mammogram in Dec 2020. - RTC  in 6 mths.   2.  Bone health: -DEXA scan on 07/29/2017 with T score of -0.8. - Prolia every 6 months started on 08/18/2015.  She is tolerating it very well. -She will continue calcium and vitamin D supplements. -She was encouraged to do weightbearing exercises.  Will check vitamin D level prior to next visit.   Total time spent is 25 minutes with more than 50% of the time spent face-to-face discussing surveillance plan, counseling and coordination of care.  Orders placed this encounter:  Orders Placed This Encounter  Procedures  . MM Digital Screening Unilat L  . CBC with Differential  . Comprehensive metabolic panel  . Vitamin D 1,25 dihydroxy      Derek Jack, MD [place] [phone]

## 2019-02-17 NOTE — Assessment & Plan Note (Addendum)
1.  Stage I (PT1CPN0) right breast IDC: -Status post right mastectomy followed by TRAM flap in December 2015, 2 cm IDC, grade 3, 2 sentinel lymph node negative, ER/PR positive, HER-2 negative, Ki-67 33%. -Oncotype DX recurrence score of 24. -4 cycles of TC from 09/06/2014 through 11/08/2014. - Patient currently taking anastrozole.  She is tolerating it very well without any adverse effects. - Left breast mammogram on 08/11/2018 shows no evidence of malignancy.  Screening mammogram in 1 year recommended. - Today's physical exam did not reveal any evidence of disease. Plan to repeat mammogram in Dec 2020. - RTC in 6 mths.   2.  Bone health: -DEXA scan on 07/29/2017 with T score of -0.8. - Prolia every 6 months started on 08/18/2015.  She is tolerating it very well. -She will continue calcium and vitamin D supplements. -She was encouraged to do weightbearing exercises.  Will check vitamin D level prior to next visit.

## 2019-02-17 NOTE — Progress Notes (Signed)
Lydia Stevens presents today for injection per the provider's orders.  Prolia administration without incident; see MAR for injection details.  Patient tolerated procedure well and without incident.  No questions or complaints noted at this time. PT d/c ambulatory

## 2019-02-23 ENCOUNTER — Encounter: Payer: Self-pay | Admitting: Family Medicine

## 2019-02-23 ENCOUNTER — Ambulatory Visit (INDEPENDENT_AMBULATORY_CARE_PROVIDER_SITE_OTHER): Payer: 59 | Admitting: Family Medicine

## 2019-02-23 ENCOUNTER — Other Ambulatory Visit: Payer: Self-pay

## 2019-02-23 VITALS — BP 112/68 | HR 68 | Temp 98.3°F | Resp 14 | Ht 67.5 in | Wt 190.0 lb

## 2019-02-23 DIAGNOSIS — E559 Vitamin D deficiency, unspecified: Secondary | ICD-10-CM | POA: Diagnosis not present

## 2019-02-23 DIAGNOSIS — E663 Overweight: Secondary | ICD-10-CM | POA: Diagnosis not present

## 2019-02-23 DIAGNOSIS — E785 Hyperlipidemia, unspecified: Secondary | ICD-10-CM

## 2019-02-23 MED ORDER — SAXENDA 18 MG/3ML ~~LOC~~ SOPN: 0.6000 mg | PEN_INJECTOR | Freq: Every day | SUBCUTANEOUS | 3 refills | Status: DC

## 2019-02-23 NOTE — Patient Instructions (Addendum)
Start saxenda:  Week 1: 0.6mg  injected daily:   Week 2:  1.2 mg injected daily  Week 3:  1.8mg  injected daily  Week 4:  2.4mg  injected daily  Week 5:  3mg  injected daily F/U 2 months for weight

## 2019-02-23 NOTE — Progress Notes (Signed)
   Subjective:    Patient ID: Lydia Stevens, female    DOB: [birth year], 48 y.o.   MRN: [id number]  Patient presents for Follow-up (is fasting)   History of breast cancer, has 1 more year on arimidex    Hyperlipidemia- was on lazy keto had elevated lipids LDL to 200 back in Sept, las tcheck LDL down to 123 in December  currently on zocor 10mg  DUE FOR recheck   Difficulty losing weight often overeats, tryin to intermittant fast does not eat after 6pm Has been on phentermine in the past but regained weight after stopping it Has tried lazy keto  Would like to try another medication  Taking cognisum for memory, fatigue  Review Of Systems:  GEN- denies fatigue, fever, weight loss,weakness, recent illness HEENT- denies eye drainage, change in vision, nasal discharge, CVS- denies chest pain, palpitations RESP- denies SOB, cough, wheeze ABD- denies N/V, change in stools, abd pain GU- denies dysuria, hematuria, dribbling, incontinence MSK- denies joint pain, muscle aches, injury Neuro- denies headache, dizziness, syncope, seizure activity       Objective:    BP 112/68   Pulse 68   Temp 98.3 F (36.8 C) (Oral)   Resp 14   Ht 5' 7.5" (1.715 m)   Wt 190 lb (86.2 kg)   LMP 06/19/2016   SpO2 93%   BMI 29.32 kg/m  GEN- NAD, alert and oriented x3 HEENT- PERRL, EOMI, non injected sclera, pink conjunctiva, MMM, oropharynx clear Neck- Supple, no thyromegaly CVS- RRR, no murmur RESP-CTAB ABD-NABS,soft,NT,ND EXT- No edema Pulses- Radial, DP- 2+        Assessment & Plan:      Problem List Items Addressed This Visit      Unprioritized   Hyperlipidemia - Primary   Relevant Orders   Comprehensive metabolic panel (Completed)   CBC with Differential/Platelet (Completed)   Lipid panel (Completed)   Overweight (BMI 25.0-29.9)    She is overweight with hyperlipidemia which to give her gives her risk factors and makes her medically obese.  She would benefit from weight loss.   We discussed the use of Saxenda.  She was shown how to use the injection in the office.  We will follow-up in about 8 weeks on her weight.  We will check her metabolic panel CBC and her lipid panel.  I think that her cholesterol went up significantly and she has started the keto diet was high on the fats but still having too many carbs which is why she not lose any significant weight.  I suspect that her cholesterol will be fairly normal as she continue to lose weight should be able to come off of her statin drug.      Relevant Orders   Comprehensive metabolic panel (Completed)   CBC with Differential/Platelet (Completed)   Lipid panel (Completed)   Vitamin D deficiency   Relevant Orders   Vitamin D, 25-hydroxy (Completed)      Note: This dictation was prepared with Dragon dictation along with smaller phrase technology. Any transcriptional errors that result from this process are unintentional.

## 2019-02-24 ENCOUNTER — Encounter: Payer: Self-pay | Admitting: Family Medicine

## 2019-02-24 LAB — COMPREHENSIVE METABOLIC PANEL
AG Ratio: 1.4 (calc) (ref 1.0–2.5)
ALT: 8 U/L (ref 6–29)
AST: 10 U/L (ref 10–35)
Albumin: 4 g/dL (ref 3.6–5.1)
Alkaline phosphatase (APISO): 71 U/L (ref 31–125)
BUN: 13 mg/dL (ref 7–25)
CO2: 28 mmol/L (ref 20–32)
Calcium: 9.1 mg/dL (ref 8.6–10.2)
Chloride: 106 mmol/L (ref 98–110)
Creat: 0.9 mg/dL (ref 0.50–1.10)
Globulin: 2.9 g/dL (calc) (ref 1.9–3.7)
Glucose, Bld: 87 mg/dL (ref 65–99)
Potassium: 4.5 mmol/L (ref 3.5–5.3)
Sodium: 140 mmol/L (ref 135–146)
Total Bilirubin: 0.4 mg/dL (ref 0.2–1.2)
Total Protein: 6.9 g/dL (ref 6.1–8.1)

## 2019-02-24 LAB — CBC WITH DIFFERENTIAL/PLATELET
Absolute Monocytes: 416 cells/uL (ref 200–950)
Basophils Absolute: 29 cells/uL (ref 0–200)
Basophils Relative: 0.4 %
Eosinophils Absolute: 80 cells/uL (ref 15–500)
Eosinophils Relative: 1.1 %
HCT: 37.2 % (ref 35.0–45.0)
Hemoglobin: 12.2 g/dL (ref 11.7–15.5)
Lymphs Abs: 1876 cells/uL (ref 850–3900)
MCH: 27.9 pg (ref 27.0–33.0)
MCHC: 32.8 g/dL (ref 32.0–36.0)
MCV: 85.1 fL (ref 80.0–100.0)
MPV: 10.7 fL (ref 7.5–12.5)
Monocytes Relative: 5.7 %
Neutro Abs: 4898 cells/uL (ref 1500–7800)
Neutrophils Relative %: 67.1 %
Platelets: 268 10*3/uL (ref 140–400)
RBC: 4.37 10*6/uL (ref 3.80–5.10)
RDW: 14.5 % (ref 11.0–15.0)
Total Lymphocyte: 25.7 %
WBC: 7.3 10*3/uL (ref 3.8–10.8)

## 2019-02-24 LAB — LIPID PANEL
Cholesterol: 175 mg/dL (ref <200)
HDL: 50 mg/dL (ref >=50)
LDL Cholesterol (Calc): 108 mg/dL (calc) — ABNORMAL HIGH
Non-HDL Cholesterol (Calc): 125 mg/dL (calc) (ref <130)
Total CHOL/HDL Ratio: 3.5 (calc) (ref <5.0)
Triglycerides: 80 mg/dL (ref <150)

## 2019-02-24 LAB — VITAMIN D 25 HYDROXY (VIT D DEFICIENCY, FRACTURES): Vit D, 25-Hydroxy: 50 ng/mL (ref 30–100)

## 2019-02-24 NOTE — Assessment & Plan Note (Signed)
She is overweight with hyperlipidemia which to give her gives her risk factors and makes her medically obese.  She would benefit from weight loss.  We discussed the use of Saxenda.  She was shown how to use the injection in the office.  We will follow-up in about 8 weeks on her weight.  We will check her metabolic panel CBC and her lipid panel.  I think that her cholesterol went up significantly and she has started the keto diet was high on the fats but still having too many carbs which is why she not lose any significant weight.  I suspect that her cholesterol will be fairly normal as she continue to lose weight should be able to come off of her statin drug.

## 2019-02-25 ENCOUNTER — Encounter: Payer: Self-pay | Admitting: *Deleted

## 2019-03-03 ENCOUNTER — Telehealth: Payer: Self-pay | Admitting: *Deleted

## 2019-03-03 NOTE — Telephone Encounter (Signed)
[employer] is reviewing your PA request. Typically an electronic response will be received within 72 hours. To check for an update later, open this request from your dashboard.    You may close this dialog and return to your dashboard to perform other tasks.

## 2019-03-03 NOTE — Telephone Encounter (Signed)
Received request from pharmacy for [place] on Saxenda.   PA submitted.   Dx: E66.09- obesity.   The requested drug is being used as an adjunct to lifestyle modification (e.g., dietary or caloric restriction, exercise, behavioral support, community based program). The patient has engaged in > 26months of lifestyle modification and failed to achieve desired weight loss. The patient has BMI greater than or equal to 30 kilograms / square meter with additional disorder related to weight (high cholesterol, hypertension, diabetes, or sleep apnea).

## 2019-03-03 NOTE — Telephone Encounter (Signed)
Your information has been sent to [facility]. 

## 2019-03-05 NOTE — Telephone Encounter (Signed)
Received PA determination.   PA denied as medication is not a covered benefit.   MD please advise.

## 2019-03-07 NOTE — Telephone Encounter (Signed)
Call pt Lydia Stevens not covered by insurance, she can try the phentermine again if she wants Start phentermine 37.5mg  - take 1/2 tablet daily for 2 weeks, then 1 full tablet F/U in office in 6 weeks for weight check

## 2019-03-08 MED ORDER — PHENTERMINE HCL 37.5 MG PO TABS: 37.5000 mg | ORAL_TABLET | Freq: Every day | ORAL | 1 refills | Status: DC

## 2019-03-08 NOTE — Telephone Encounter (Signed)
Call placed to patient and patient made aware.   Agreeable to resuming Phentermine.   Medication pended for your review.

## 2019-03-16 ENCOUNTER — Telehealth: Payer: Self-pay | Admitting: *Deleted

## 2019-03-16 NOTE — Telephone Encounter (Signed)
Received request from pharmacy for PA on Phentermine ° °PA submitted.  ° °Dx: E66.09- obesity.  °

## 2019-03-16 NOTE — Telephone Encounter (Signed)
Received PA determination .   [id number] approved through 09/16/2019.  Pharmacy made aware.

## 2019-03-16 NOTE — Telephone Encounter (Signed)
[employer] is reviewing your PA request. Typically an electronic response will be received within 72 hours. To check for an update later, open this request from your dashboard.    You may close this dialog and return to your dashboard to perform other tasks.

## 2019-03-29 ENCOUNTER — Other Ambulatory Visit (HOSPITAL_COMMUNITY): Payer: Self-pay | Admitting: Hematology

## 2019-04-05 ENCOUNTER — Telehealth: Payer: Self-pay | Admitting: *Deleted

## 2019-04-05 NOTE — Telephone Encounter (Signed)
Patient in office with her mother.   States that she has not begun Phentermine as medication is on backorder.   Requested to cancel F/U appointment in regards to weight loss. Appointment cancelled.

## 2019-04-26 ENCOUNTER — Encounter: Payer: 59 | Admitting: Family Medicine

## 2019-04-28 ENCOUNTER — Other Ambulatory Visit: Payer: Self-pay | Admitting: Family Medicine

## 2019-04-28 DIAGNOSIS — E785 Hyperlipidemia, unspecified: Secondary | ICD-10-CM

## 2019-04-30 ENCOUNTER — Encounter: Payer: Self-pay | Admitting: Family Medicine

## 2019-04-30 ENCOUNTER — Telehealth: Payer: Self-pay | Admitting: Family Medicine

## 2019-04-30 NOTE — Telephone Encounter (Signed)
Patient dropped off fmla forms for herself to care for mother patti Enriqueta Shutter  Will route to christina on 05/04/19

## 2019-05-04 NOTE — Telephone Encounter (Signed)
Awaiting forms

## 2019-05-05 NOTE — Telephone Encounter (Signed)
FMLA placed under patient chart.

## 2019-07-27 ENCOUNTER — Other Ambulatory Visit: Payer: Self-pay

## 2019-07-27 DIAGNOSIS — E785 Hyperlipidemia, unspecified: Secondary | ICD-10-CM

## 2019-07-27 MED ORDER — SIMVASTATIN 10 MG PO TABS: ORAL_TABLET | ORAL | 2 refills | Status: DC

## 2019-08-11 ENCOUNTER — Inpatient Hospital Stay (HOSPITAL_COMMUNITY): Payer: 59 | Attending: Hematology

## 2019-08-11 DIAGNOSIS — Z79899 Other long term (current) drug therapy: Secondary | ICD-10-CM | POA: Insufficient documentation

## 2019-08-11 DIAGNOSIS — Z9011 Acquired absence of right breast and nipple: Secondary | ICD-10-CM | POA: Insufficient documentation

## 2019-08-11 DIAGNOSIS — M8580 Other specified disorders of bone density and structure, unspecified site: Secondary | ICD-10-CM | POA: Insufficient documentation

## 2019-08-11 DIAGNOSIS — Z79811 Long term (current) use of aromatase inhibitors: Secondary | ICD-10-CM | POA: Insufficient documentation

## 2019-08-11 DIAGNOSIS — C50311 Malignant neoplasm of lower-inner quadrant of right female breast: Secondary | ICD-10-CM | POA: Insufficient documentation

## 2019-08-11 DIAGNOSIS — Z170 Estrogen receptor positive status [ER+]: Secondary | ICD-10-CM | POA: Insufficient documentation

## 2019-08-13 ENCOUNTER — Ambulatory Visit (HOSPITAL_COMMUNITY)
Admission: RE | Admit: 2019-08-13 | Discharge: 2019-08-13 | Disposition: A | Discharge status: Home / Self Care | Payer: 59 | Source: Ambulatory Visit | Attending: Hematology | Admitting: Hematology

## 2019-08-13 ENCOUNTER — Other Ambulatory Visit: Payer: Self-pay

## 2019-08-13 DIAGNOSIS — Z1231 Encounter for screening mammogram for malignant neoplasm of breast: Secondary | ICD-10-CM | POA: Diagnosis not present

## 2019-08-13 DIAGNOSIS — Z170 Estrogen receptor positive status [ER+]: Secondary | ICD-10-CM

## 2019-08-13 DIAGNOSIS — Z9011 Acquired absence of right breast and nipple: Secondary | ICD-10-CM | POA: Diagnosis not present

## 2019-08-13 DIAGNOSIS — Z853 Personal history of malignant neoplasm of breast: Secondary | ICD-10-CM | POA: Diagnosis not present

## 2019-08-13 DIAGNOSIS — C50311 Malignant neoplasm of lower-inner quadrant of right female breast: Secondary | ICD-10-CM

## 2019-08-16 ENCOUNTER — Ambulatory Visit (HOSPITAL_COMMUNITY): Payer: 59

## 2019-08-16 ENCOUNTER — Other Ambulatory Visit: Payer: Self-pay

## 2019-08-17 ENCOUNTER — Encounter (HOSPITAL_COMMUNITY): Payer: Self-pay | Admitting: Hematology

## 2019-08-17 ENCOUNTER — Inpatient Hospital Stay (HOSPITAL_BASED_OUTPATIENT_CLINIC_OR_DEPARTMENT_OTHER): Payer: 59 | Admitting: Hematology

## 2019-08-17 ENCOUNTER — Ambulatory Visit (HOSPITAL_COMMUNITY): Payer: 59

## 2019-08-17 ENCOUNTER — Inpatient Hospital Stay (HOSPITAL_COMMUNITY): Payer: 59

## 2019-08-17 VITALS — BP 124/58 | HR 93 | Temp 97.1°F | Resp 18 | Wt 186.7 lb

## 2019-08-17 DIAGNOSIS — Z170 Estrogen receptor positive status [ER+]: Secondary | ICD-10-CM

## 2019-08-17 DIAGNOSIS — Z79899 Other long term (current) drug therapy: Secondary | ICD-10-CM | POA: Diagnosis not present

## 2019-08-17 DIAGNOSIS — Z9011 Acquired absence of right breast and nipple: Secondary | ICD-10-CM | POA: Diagnosis not present

## 2019-08-17 DIAGNOSIS — C50311 Malignant neoplasm of lower-inner quadrant of right female breast: Secondary | ICD-10-CM | POA: Diagnosis not present

## 2019-08-17 DIAGNOSIS — M8580 Other specified disorders of bone density and structure, unspecified site: Secondary | ICD-10-CM | POA: Diagnosis not present

## 2019-08-17 DIAGNOSIS — Z1231 Encounter for screening mammogram for malignant neoplasm of breast: Secondary | ICD-10-CM

## 2019-08-17 DIAGNOSIS — Z79811 Long term (current) use of aromatase inhibitors: Secondary | ICD-10-CM | POA: Diagnosis not present

## 2019-08-17 LAB — CBC WITH DIFFERENTIAL/PLATELET
Abs Immature Granulocytes: 0.01 10*3/uL (ref 0.00–0.07)
Basophils Absolute: 0 10*3/uL (ref 0.0–0.1)
Basophils Relative: 1 %
Eosinophils Absolute: 0.1 10*3/uL (ref 0.0–0.5)
Eosinophils Relative: 2 %
HCT: 38.2 % (ref 36.0–46.0)
Hemoglobin: 12.1 g/dL (ref 12.0–15.0)
Immature Granulocytes: 0 %
Lymphocytes Relative: 25 %
Lymphs Abs: 1.8 10*3/uL (ref 0.7–4.0)
MCH: 28.1 pg (ref 26.0–34.0)
MCHC: 31.7 g/dL (ref 30.0–36.0)
MCV: 88.6 fL (ref 80.0–100.0)
Monocytes Absolute: 0.4 10*3/uL (ref 0.1–1.0)
Monocytes Relative: 5 %
Neutro Abs: 5.1 10*3/uL (ref 1.7–7.7)
Neutrophils Relative %: 67 %
Platelets: 241 10*3/uL (ref 150–400)
RBC: 4.31 MIL/uL (ref 3.87–5.11)
RDW: 15.4 % (ref 11.5–15.5)
WBC: 7.4 10*3/uL (ref 4.0–10.5)
nRBC: 0 % (ref 0.0–0.2)

## 2019-08-17 LAB — COMPREHENSIVE METABOLIC PANEL
ALT: 13 U/L (ref 0–44)
AST: 16 U/L (ref 15–41)
Albumin: 3.9 g/dL (ref 3.5–5.0)
Alkaline Phosphatase: 67 U/L (ref 38–126)
Anion gap: 10 (ref 5–15)
BUN: 13 mg/dL (ref 6–20)
CO2: 27 mmol/L (ref 22–32)
Calcium: 9.5 mg/dL (ref 8.9–10.3)
Chloride: 105 mmol/L (ref 98–111)
Creatinine, Ser: 0.95 mg/dL (ref 0.44–1.00)
GFR calc Af Amer: >60 mL/min (ref >60)
GFR calc non Af Amer: >60 mL/min (ref >60)
Glucose, Bld: 82 mg/dL (ref 70–99)
Potassium: 3.7 mmol/L (ref 3.5–5.1)
Sodium: 142 mmol/L (ref 135–145)
Total Bilirubin: 0.5 mg/dL (ref 0.3–1.2)
Total Protein: 7.4 g/dL (ref 6.5–8.1)

## 2019-08-17 NOTE — Assessment & Plan Note (Addendum)
1.  Stage I (PT1CPN0) right breast IDC: -Status post right mastectomy followed by TRAM flap in December 2015, 2 cm IDC, grade 3, 2 sentinel lymph node negative, ER/PR positive, HER-2 negative, Ki-67 33%. -Oncotype DX recurrence score of 24. -4 cycles of TC from 09/06/2014 through 11/08/2014. -She is tolerating anastrozole very well, which was started around April 2016. -We discussed the options of BCI testing versus continuing anastrozole beyond 5 years.  As she is tolerating it very well, she is inclined to proceed with long-term antiestrogen therapy. -We reviewed her left breast mammogram dated 08/13/2019 which was BI-RADS Category 1.  I also reviewed her labs which are within normal limits.  Physical exam today did not reveal any palpable masses. -We will switch her to yearly visits at this time.  We will also schedule her next mammogram next year.  2.  Bone health: -DEXA scan on 07/29/2017 with T score of -0.8. -She was started on Prolia every 6 months on 08/18/2015.  Last dose was on 02/17/2019. -She reports that her co-pays are very expensive for Prolia.  She would like to hold off on Prolia at this time. -I have told her to continue calcium and vitamin D supplements.  I have told her to start doing weightbearing exercises. -I plan to repeat DEXA scan in 1 to 2 years.

## 2019-08-17 NOTE — Patient Instructions (Addendum)
[place] at [facility] Discharge Instructions  You were seen today by Dr. Delton Coombes. He went over your recent lab results. He recommends that you continue taking the anastrozole for at least 10 years. Please continue to get your mammograms once a year. He will see you back in 1 year for labs and follow up.   Thank you for choosing [place] at [facility] to provide your oncology and hematology care.  To afford each patient quality time with our provider, please arrive at least 15 minutes before your scheduled appointment time.   If you have a lab appointment with the [facility] please come in thru the  Main Entrance and check in at the main information desk  You need to re-schedule your appointment should you arrive 10 or more minutes late.  We strive to give you quality time with our providers, and arriving late affects you and other patients whose appointments are after yours.  Also, if you no show three or more times for appointments you may be dismissed from the clinic at the providers discretion.     Again, thank you for choosing [facility].  Our hope is that these requests will decrease the amount of time that you wait before being seen by our physicians.       _____________________________________________________________  Should you have questions after your visit to [facility], please contact our office at [phone] between the hours of 8:00 a.m. and 4:30 p.m.  Voicemails left after 4:00 p.m. will not be returned until the following business day.  For prescription refill requests, have your pharmacy contact our office and allow 72 hours.    Cancer Center Support Programs:   > Cancer Support Group  2nd Tuesday of the month 1pm-2pm, Journey Room

## 2019-08-17 NOTE — Progress Notes (Signed)
[place], [place] [address]   CLINIC:  Medical Oncology/Hematology  PCP:  Alycia Rossetti, MD [address] [place] [address] [phone]   REASON FOR VISIT:  Follow-up for Breast Cancer  CURRENT THERAPY: Aromatase Inhibitor  BRIEF ONCOLOGIC HISTORY:  Oncology History  Breast cancer of lower-inner quadrant of right female breast ([place])  06/07/2014 Initial Biopsy   Right breast needle biopsy 5:00 position: Invasive ductal carcinoma with DCIS, ER 100%, PR 71%, Ki-67 33%, HER-2 negative ratio 1.03   06/17/2014 Breast MRI   Right breast: 10 x 7 x 5 mm biopsy-proven IDC with DCIS, left breast 7 x 7 x 7 mm fibroadenoma, left upper quadrant of the abdomen abutting the peritoneum 3 x 1.1 cm oval soft tissue mass   07/27/2014 Surgery   Right breast mastectomy: Invasive ductal carcinoma grade 3, 2 cm, intermediate grade DCIS, lymphovascular invasion identified, 2 SLN negative, T1 C. N0 M0 stage IA, ER positive, PR 7%, HER-2 negative, Ki-67 33%   07/27/2014 Oncotype testing   Recurrence Score of 24, placing patient in the intermediate risk group   09/06/2014 -  Chemotherapy   Taxotere/Cytoxan    09/08/2014 Adverse Reaction   Nasuea and voming three times x 2 days.  Added Aloxi and Emend to anti-emetic regimen.   10/18/2014 -  Chemotherapy   Zoladex   10/27/2014 Adverse Reaction   Hives, secondary to Zoladex?   02/16/2015 Surgery   Laparoscopic BSO with Dr. Alycia Rossetti      CANCER STAGING: Cancer Staging Breast cancer of lower-inner quadrant of right female breast [facility]) Staging form: Breast, AJCC 7th Edition - Clinical: Stage IA (T1b, N0, cM0) - Unsigned - Pathologic: Stage IA (T1c, N0, cM0) - Signed by Seward Grater, MD on 09/13/2014    INTERVAL HISTORY:  Ms. Christian 48 y.o. female seen for follow-up of breast cancer.  She is tolerating anastrozole very well.  Appetite and energy levels are 100%.  Denies any new onset pains.   She is working full-time job and works for [employer].  No significant hot flashes or musculoskeletal symptoms.   REVIEW OF SYSTEMS:  Review of Systems  All other systems reviewed and are negative.    PAST MEDICAL/SURGICAL HISTORY:  Past Medical History:  Diagnosis Date  . Anemia    before   . Blood transfusion without reported diagnosis   . Breast cancer ([place])    2015  . GERD (gastroesophageal reflux disease)   . History of breast cancer 12/29/2014  . Hives Mar 3rd 2016  . Hyperlipidemia   . Osteoporosis 08/18/2015  . Scoliosis    Past Surgical History:  Procedure Laterality Date  . BREAST RECONSTRUCTION Right 12/06/2014   Procedure: RIGHT NIPPLE AREOLAR RECONSTRUCTION WITH FULL THICKNESS SKIN GRAFT FROM RIGHT THIGH;  Surgeon: Irene Limbo, MD;  Location: [facility];  Service: Plastics;  Laterality: Right;  . BREAST REDUCTION SURGERY Left 12/06/2014   Procedure: LEFT BREAST REDUCTION FOR ASYMMETRY;  Surgeon: Irene Limbo, MD;  Location: [facility];  Service: Plastics;  Laterality: Left;  . CESAREAN SECTION  1996  . LATISSIMUS FLAP TO BREAST Right 07/27/2014   Procedure: TRAM FLAP RECONSTUCTION RIGHT CHEST;  Surgeon: Irene Limbo, MD;  Location: [facility];  Service: Plastics;  Laterality: Right;  . MASTECTOMY W/ SENTINEL NODE BIOPSY Right 07/27/2014   Procedure: RIGHT MASTECTOMY WITH RIGHT AXILLARY SENTINEL LYMPH NODE BIOPSY;  Surgeon: Alphonsa Overall, MD;  Location: [facility]  OR;  Service: General;  Laterality: Right;  . OOPHORECTOMY  2016  . PORT-A-CATH REMOVAL Left 12/06/2014   Procedure: REMOVAL PORT-A-CATH;  Surgeon: Irene Limbo, MD;  Location: [facility];  Service: Plastics;  Laterality: Left;  . PORTACATH PLACEMENT Left 09/05/2013  . WISDOM TOOTH EXTRACTION       SOCIAL HISTORY:  Social History   Socioeconomic History  . Marital status: Married    Spouse name: Not on file  . Number of children: 1  . Years of  education: Not on file  . Highest education level: Not on file  Occupational History  . Not on file  Tobacco Use  . Smoking status: Never Smoker  . Smokeless tobacco: Never Used  Substance and Sexual Activity  . Alcohol use: No    Alcohol/week: 0.0 standard drinks  . Drug use: No  . Sexual activity: Yes    Birth control/protection: None  Other Topics Concern  . Not on file  Social History Narrative  . Not on file   Social Determinants of Health   Financial Resource Strain:   . Difficulty of Paying Living Expenses: Not on file  Food Insecurity:   . Worried About Charity fundraiser in the Last Year: Not on file  . Ran Out of Food in the Last Year: Not on file  Transportation Needs:   . Lack of Transportation (Medical): Not on file  . Lack of Transportation (Non-Medical): Not on file  Physical Activity:   . Days of Exercise per Week: Not on file  . Minutes of Exercise per Session: Not on file  Stress:   . Feeling of Stress : Not on file  Social Connections:   . Frequency of Communication with Friends and Family: Not on file  . Frequency of Social Gatherings with Friends and Family: Not on file  . Attends Religious Services: Not on file  . Active Member of Clubs or Organizations: Not on file  . Attends Archivist Meetings: Not on file  . Marital Status: Not on file  Intimate Partner Violence:   . Fear of Current or Ex-Partner: Not on file  . Emotionally Abused: Not on file  . Physically Abused: Not on file  . Sexually Abused: Not on file    FAMILY HISTORY:  Family History  Problem Relation Age of Onset  . Heart attack Father   . Arthritis Father   . Heart disease Father        Massive MI  . Breast cancer Maternal Aunt 3  . Breast cancer Sister 1  . Cancer Sister   . Breast cancer Maternal Aunt 15  . Arthritis Mother   . Hypertension Mother   . Asthma Sister   . Alcohol abuse Brother   . Hypertension Brother   . Cancer Sister        1/2 - breast  cancer  . Breast cancer Paternal Aunt 26    CURRENT MEDICATIONS:  Outpatient Encounter Medications as of 08/17/2019  Medication Sig  . anastrozole (ARIMIDEX) 1 MG tablet TAKE 1 TABLET(1 MG) BY MOUTH DAILY  . Calcium Carbonate-Vit D-Min (CALCIUM 1200 PO) Take by mouth.  . simvastatin (ZOCOR) 10 MG tablet TAKE 1 TABLET(10 MG) BY MOUTH EVERY EVENING  . [DISCONTINUED] phentermine (ADIPEX-P) 37.5 MG tablet Take 1 tablet (37.5 mg total) by mouth daily before breakfast.   Facility-Administered Encounter Medications as of 08/17/2019  Medication  . diphenhydrAMINE (BENADRYL) injection 50 mg    ALLERGIES:  Allergies  Allergen Reactions  . Oxycodone Nausea Only     PHYSICAL EXAM:  ECOG Performance status: 1  Vitals:   08/17/19 0829  BP: (!) 124/58  Pulse: 93  Resp: 18  Temp: (!) 97.1 F (36.2 C)  SpO2: 100%   Filed Weights   08/17/19 0829  Weight: 186 lb 11.2 oz (84.7 kg)    Physical Exam Constitutional:      Appearance: Normal appearance. She is obese.  HENT:     Head: Normocephalic.     Nose: Nose normal.     Mouth/Throat:     Mouth: Mucous membranes are moist.     Pharynx: Oropharynx is clear.  Eyes:     Extraocular Movements: Extraocular movements intact.     Conjunctiva/sclera: Conjunctivae normal.  Cardiovascular:     Rate and Rhythm: Normal rate and regular rhythm.     Pulses: Normal pulses.     Heart sounds: Normal heart sounds.  Pulmonary:     Effort: Pulmonary effort is normal.     Breath sounds: Normal breath sounds.  Abdominal:     General: Bowel sounds are normal.     Palpations: Abdomen is soft.  Musculoskeletal:        General: Normal range of motion.     Cervical back: Normal range of motion.  Skin:    General: Skin is warm and dry.  Neurological:     General: No focal deficit present.     Mental Status: She is alert and oriented to person, place, and time.  Psychiatric:        Mood and Affect: Mood normal.        Behavior: Behavior  normal.        Thought Content: Thought content normal.        Judgment: Judgment normal.    Right breast TRAM flap site is within normal limits.  Left breast has no palpable masses.  No palpable adenopathy.  LABORATORY DATA:  I have reviewed the labs as listed.  CBC    Component Value Date/Time   WBC 7.4 08/17/2019 0814   RBC 4.31 08/17/2019 0814   HGB 12.1 08/17/2019 0814   HGB 11.7 09/05/2014 0914   HCT 38.2 08/17/2019 0814   HCT 35.4 09/05/2014 0914   PLT 241 08/17/2019 0814   PLT 297 09/05/2014 0914   MCV 88.6 08/17/2019 0814   MCV 84.9 09/05/2014 0914   MCH 28.1 08/17/2019 0814   MCHC 31.7 08/17/2019 0814   RDW 15.4 08/17/2019 0814   RDW 14.9 (H) 09/05/2014 0914   LYMPHSABS 1.8 08/17/2019 0814   LYMPHSABS 1.6 09/05/2014 0914   MONOABS 0.4 08/17/2019 0814   MONOABS 0.5 09/05/2014 0914   EOSABS 0.1 08/17/2019 0814   EOSABS 0.2 09/05/2014 0914   BASOSABS 0.0 08/17/2019 0814   BASOSABS 0.0 09/05/2014 0914   CMP Latest Ref Rng & Units 08/17/2019 02/23/2019 02/16/2019  Glucose 70 - 99 mg/dL 82 87 102(H)  BUN 6 - 20 mg/dL _0 Creatinine 0.44 - 1.00 mg/dL 0.95 0.90 0.97  Sodium 135 - 145 mmol/L 142 140 140  Potassium 3.5 - 5.1 mmol/L 3.7 4.5 4.2  Chloride 98 - 111 mmol/L 105 106 104  CO2 22 - 32 mmol/L _1 Calcium 8.9 - 10.3 mg/dL 9.5 9.1 9.7  Total Protein 6.5 - 8.1 g/dL 7.4 6.9 7.7  Total Bilirubin 0.3 - 1.2 mg/dL 0.5 0.4 0.6  Alkaline Phos 38 - 126 U/L 67 - 65  AST  15 - 41 U/L 16 10 13(L)  ALT 0 - 44 U/L _0 ASSESSMENT & PLAN:   Breast cancer of lower-inner quadrant of right female breast 1.  Stage I (PT1CPN0) right breast IDC: -Status post right mastectomy followed by TRAM flap in December 2015, 2 cm IDC, grade 3, 2 sentinel lymph node negative, ER/PR positive, HER-2 negative, Ki-67 33%. -Oncotype DX recurrence score of 24. -4 cycles of TC from 09/06/2014 through 11/08/2014. -She is tolerating anastrozole very well, which was started  around April 2016. -We discussed the options of BCI testing versus continuing anastrozole beyond 5 years.  As she is tolerating it very well, she is inclined to proceed with long-term antiestrogen therapy. -We reviewed her left breast mammogram dated 08/13/2019 which was BI-RADS Category 1.  I also reviewed her labs which are within normal limits.  Physical exam today did not reveal any palpable masses. -We will switch her to yearly visits at this time.  We will also schedule her next mammogram next year.  2.  Bone health: -DEXA scan on 07/29/2017 with T score of -0.8. -She was started on Prolia every 6 months on 08/18/2015.  Last dose was on 02/17/2019. -She reports that her co-pays are very expensive for Prolia.  She would like to hold off on Prolia at this time. -I have told her to continue calcium and vitamin D supplements.  I have told her to start doing weightbearing exercises. -I plan to repeat DEXA scan in 1 to 2 years.    Orders placed this encounter:  Orders Placed This Encounter  Procedures  . MM 3D SCREEN BREAST UNI LEFT  . CBC with Differential/Platelet  . Comprehensive metabolic panel  . Vitamin D 25 hydroxy      Derek Jack, MD [place] [phone]

## 2019-09-25 ENCOUNTER — Other Ambulatory Visit (HOSPITAL_COMMUNITY): Payer: Self-pay | Admitting: Hematology

## 2019-10-25 ENCOUNTER — Other Ambulatory Visit: Payer: Self-pay | Admitting: Family Medicine

## 2019-10-25 DIAGNOSIS — E785 Hyperlipidemia, unspecified: Secondary | ICD-10-CM

## 2020-01-23 ENCOUNTER — Other Ambulatory Visit: Payer: Self-pay | Admitting: Family Medicine

## 2020-01-23 DIAGNOSIS — E785 Hyperlipidemia, unspecified: Secondary | ICD-10-CM

## 2020-03-23 ENCOUNTER — Other Ambulatory Visit (HOSPITAL_COMMUNITY): Payer: Self-pay | Admitting: Hematology

## 2020-04-22 ENCOUNTER — Other Ambulatory Visit: Payer: Self-pay | Admitting: Family Medicine

## 2020-04-22 DIAGNOSIS — E785 Hyperlipidemia, unspecified: Secondary | ICD-10-CM

## 2020-05-22 ENCOUNTER — Other Ambulatory Visit: Payer: Self-pay | Admitting: Family Medicine

## 2020-05-22 DIAGNOSIS — E785 Hyperlipidemia, unspecified: Secondary | ICD-10-CM

## 2020-06-21 ENCOUNTER — Other Ambulatory Visit: Payer: Self-pay | Admitting: Family Medicine

## 2020-06-21 DIAGNOSIS — E785 Hyperlipidemia, unspecified: Secondary | ICD-10-CM

## 2020-07-05 ENCOUNTER — Encounter: Payer: Self-pay | Admitting: Family Medicine

## 2020-07-05 ENCOUNTER — Ambulatory Visit (INDEPENDENT_AMBULATORY_CARE_PROVIDER_SITE_OTHER): Payer: 59 | Admitting: Family Medicine

## 2020-07-05 ENCOUNTER — Other Ambulatory Visit: Payer: Self-pay

## 2020-07-05 VITALS — BP 120/64 | HR 82 | Temp 97.9°F | Resp 14 | Ht 68.0 in | Wt 190.0 lb

## 2020-07-05 DIAGNOSIS — Z124 Encounter for screening for malignant neoplasm of cervix: Secondary | ICD-10-CM

## 2020-07-05 DIAGNOSIS — E663 Overweight: Secondary | ICD-10-CM

## 2020-07-05 DIAGNOSIS — Z853 Personal history of malignant neoplasm of breast: Secondary | ICD-10-CM

## 2020-07-05 DIAGNOSIS — R202 Paresthesia of skin: Secondary | ICD-10-CM

## 2020-07-05 DIAGNOSIS — M8580 Other specified disorders of bone density and structure, unspecified site: Secondary | ICD-10-CM | POA: Diagnosis not present

## 2020-07-05 DIAGNOSIS — E559 Vitamin D deficiency, unspecified: Secondary | ICD-10-CM

## 2020-07-05 DIAGNOSIS — Z1159 Encounter for screening for other viral diseases: Secondary | ICD-10-CM

## 2020-07-05 DIAGNOSIS — E785 Hyperlipidemia, unspecified: Secondary | ICD-10-CM

## 2020-07-05 DIAGNOSIS — Z0001 Encounter for general adult medical examination with abnormal findings: Secondary | ICD-10-CM

## 2020-07-05 DIAGNOSIS — L0292 Furuncle, unspecified: Secondary | ICD-10-CM

## 2020-07-05 DIAGNOSIS — Z0000 Encounter for general adult medical examination without abnormal findings: Secondary | ICD-10-CM

## 2020-07-05 DIAGNOSIS — L0293 Carbuncle, unspecified: Secondary | ICD-10-CM

## 2020-07-05 MED ORDER — CEPHALEXIN 500 MG PO CAPS: 500.0000 mg | ORAL_CAPSULE | Freq: Two times a day (BID) | ORAL | 1 refills | Status: DC

## 2020-07-05 NOTE — Patient Instructions (Signed)
Use antibacterial soap externally Take keflex Take Calcium 1200mg  once a day and Vitamin D 1000iu once a day  We will call with lab results Schedule your bone density F/U pending results

## 2020-07-05 NOTE — Progress Notes (Signed)
Subjective:    Patient ID: Lydia Stevens, female    DOB: Nov 18, 1970, 49 y.o.   MRN: [id number]  Patient presents for Gynecologic Exam (is fasting)   Pt here for CPE, meds reviewed   She continues to follow with      She has had episodes where her left goess numb and tingles, occ feels the leg buckle. She feels like she has to move her leg around of stomp her foot to wake it up. Episodes do not last long. Denies hip or back pain, no injury, these episodes have been present for > 6 months   She has also had random episodes of numbness in her lip before too, but states often after being in bathtub, holding her head a certain way No rash on face or redness  No new meds     She has some boils on mons pubis keep breaking out vaginal  Region, mostly on mons pubis but has a small one on the labia right now No new partners    Last menses  > 20 years, she had partial hysterectomy cervix still present   Mammogram scheduled   Vitamin  D deficiency - she is not on supplement   Osteopenia- 2016 treated with prolia, , last year cost too expensive, normal Bone density in 2018 so came off hte medicaation Due for recheck    TDAP/COVID UTD    No vision changes, dentist eery 6 months     Pt married   Review Of Systems:  GEN- denies fatigue, fever, weight loss,weakness, recent illness HEENT- denies eye drainage, change in vision, nasal discharge, CVS- denies chest pain, palpitations RESP- denies SOB, cough, wheeze ABD- denies N/V, change in stools, abd pain GU- denies dysuria, hematuria, dribbling, incontinence MSK- denies joint pain, muscle aches, injury Neuro- denies headache, dizziness, syncope, seizure activity       Objective:    BP 120/64   Pulse 82   Temp 97.9 F (36.6 C) (Temporal)   Resp 14   Ht 5\' 8"  (1.727 m)   Wt 190 lb (86.2 kg)   LMP 06/19/2016   SpO2 96%   BMI 28.89 kg/m  GEN- NAD, alert and oriented x3 HEENT- PERRL, EOMI, non injected sclera, pink  conjunctiva, MMM, oropharynx clear, xanthaselema above left eye lid Neck- Supple, no thyromegaly Breast- right breast reconstruction, no nipple inversion,no nipple drainage, no nodules or lumps felt Nodes- no axillary  CVS- RRR, no murmur RESP-CTAB ABD-NABS,soft,NT,ND GU- normal external genitalia, vaginal mucosa pink and moist, cervix visualized no growth, no blood form os, no discharge, no CMT, no ovarian masses, uterus normal size , left lower labia small abscess, non fluctant indurated, mild TTP  MSK- Spine NT, FROM, FROM HIPS Neuro- normal tone LE, sensation in tact LE, strength in tact LE  EXT- No edema Pulses- Radial, DP- 2+        Assessment & Plan:      Problem List Items Addressed This Visit      Unprioritized   History of breast cancer   Hyperlipidemia   Relevant Orders   Lipid panel   Osteopenia determined by x-ray    Check bone density, see if stable with no additional meds      Relevant Orders   DG Bone Density   Overweight (BMI 25.0-29.9)   Relevant Orders   Hemoglobin A1c   Vitamin D deficiency   Relevant Orders   Vitamin D, 25-hydroxy   DG Bone Density    Other  Visit Diagnoses    Routine general medical examination at a health care facility    -  Primary   CPE done, mammo scheduled    Relevant Orders   CBC with Differential/Platelet (Completed)   Comprehensive metabolic panel   Hemoglobin A1c   HIV Antibody (routine testing w rflx)   Hepatitis C antibody   Cervical cancer screening       Relevant Orders   Pap IG w/ reflex to HPV when ASC-U   Paresthesia of left leg       MSK and neurology exam normal, check B12, vitamin D   Relevant Orders   Vitamin B12   Need for hepatitis C screening test       Relevant Orders   Hepatitis C antibody   Recurrent boils       antibacterial soap, check labs, A1C, keflex given   Relevant Medications   cephALEXin (KEFLEX) 500 MG capsule      Note: This dictation was prepared with Dragon dictation along  with smaller phrase technology. Any transcriptional errors that result from this process are unintentional.

## 2020-07-05 NOTE — Assessment & Plan Note (Signed)
Check bone density, see if stable with no additional meds

## 2020-07-05 NOTE — Progress Notes (Signed)
Subjective:    Patient ID: Lydia Stevens, female    DOB: March 22, 1971, 49 y.o.   MRN: [id number]  Patient presents for Gynecologic Exam (is fasting)   Pt here for CPE, meds reviewed   She continues to follow with      She has had episodes where her left goes, off buckles, she tries to move it around   No hip pain or back pain   Even if she lays around certain way will get numb     She has some boils on mons pubis kep breaing vaginal     She has vaginal dryness    Last menses  > 20 years, she had partial hysterectomy cervix still present   Mammogram scheduled   Vitamin  D deficiency - she is not on supplement   Osteopenia- 2016 traated with prolia, , last year cost too expensive, normal Bone density in 2018 so came off hte medicaation Due for recheck    TDAP/COVID UTD    No vision changes, dentist eery 6 months     Pt married   Review Of Systems:  GEN- denies fatigue, fever, weight loss,weakness, recent illness HEENT- denies eye drainage, change in vision, nasal discharge, CVS- denies chest pain, palpitations RESP- denies SOB, cough, wheeze ABD- denies N/V, change in stools, abd pain GU- denies dysuria, hematuria, dribbling, incontinence MSK- denies joint pain, muscle aches, injury Neuro- denies headache, dizziness, syncope, seizure activity       Objective:    BP 120/64   Pulse 82   Temp 97.9 F (36.6 C) (Temporal)   Resp 14   Ht 5\' 8"  (1.727 m)   Wt 190 lb (86.2 kg)   LMP 06/19/2016   SpO2 96%   BMI 28.89 kg/m  GEN- NAD, alert and oriented x3 HEENT- PERRL, EOMI, non injected sclera, pink conjunctiva, MMM, oropharynx clear, xanthaselema above left eye lid Neck- Supple, no thyromegaly Breast- normal symmetry, no nipple inversion,no nipple drainage, no nodules or lumps felt Nodes- no axillary  CVS- RRR, no murmur RESP-CTAB ABD-NABS,soft,NT,ND GU- normal external genitalia, vaginal mucosa pink and moist, cervix visualized no growth, no blood form os,  minimal thin clear discharge, no CMT, no ovarian masses, uterus normal size EXT- No edema Pulses- Radial, DP- 2+        Assessment & Plan:      Problem List Items Addressed This Visit      Unprioritized   History of breast cancer   Hyperlipidemia   Relevant Orders   Lipid panel   Osteopenia determined by x-ray   Relevant Orders   DG Bone Density   Overweight (BMI 25.0-29.9)   Relevant Orders   Hemoglobin A1c   Vitamin D deficiency   Relevant Orders   Vitamin D, 25-hydroxy   DG Bone Density    Other Visit Diagnoses    Routine general medical examination at a health care facility    -  Primary   Relevant Orders   CBC with Differential/Platelet   Comprehensive metabolic panel   Hemoglobin A1c   HIV Antibody (routine testing w rflx)   Hepatitis C antibody   Cervical cancer screening       Relevant Orders   Pap IG w/ reflex to HPV when ASC-U   Paresthesia of left leg       Relevant Orders   Vitamin B12   Need for hepatitis C screening test       Relevant Orders   Hepatitis C antibody  Recurrent boils          Note: This dictation was prepared with Dragon dictation along with smaller phrase technology. Any transcriptional errors that result from this process are unintentional.

## 2020-07-06 LAB — PAP IG W/ RFLX HPV ASCU

## 2020-07-06 LAB — CBC WITH DIFFERENTIAL/PLATELET
Absolute Monocytes: 446 cells/uL (ref 200–950)
Basophils Absolute: 43 cells/uL (ref 0–200)
Basophils Relative: 0.6 %
Eosinophils Absolute: 86 cells/uL (ref 15–500)
Eosinophils Relative: 1.2 %
HCT: 35.6 % (ref 35.0–45.0)
Hemoglobin: 11.8 g/dL (ref 11.7–15.5)
Lymphs Abs: 1822 cells/uL (ref 850–3900)
MCH: 28.4 pg (ref 27.0–33.0)
MCHC: 33.1 g/dL (ref 32.0–36.0)
MCV: 85.6 fL (ref 80.0–100.0)
MPV: 10.6 fL (ref 7.5–12.5)
Monocytes Relative: 6.2 %
Neutro Abs: 4802 cells/uL (ref 1500–7800)
Neutrophils Relative %: 66.7 %
Platelets: 250 10*3/uL (ref 140–400)
RBC: 4.16 10*6/uL (ref 3.80–5.10)
RDW: 13.6 % (ref 11.0–15.0)
Total Lymphocyte: 25.3 %
WBC: 7.2 10*3/uL (ref 3.8–10.8)

## 2020-07-06 LAB — HEMOGLOBIN A1C
Hgb A1c MFr Bld: 5.5 % of total Hgb (ref <5.7)
Mean Plasma Glucose: 111 (calc)
eAG (mmol/L): 6.2 (calc)

## 2020-07-06 LAB — HEPATITIS C ANTIBODY
Hepatitis C Ab: NONREACTIVE
SIGNAL TO CUT-OFF: 0.03 (ref <1.00)

## 2020-07-06 LAB — LIPID PANEL
Cholesterol: 173 mg/dL (ref <200)
HDL: 47 mg/dL — ABNORMAL LOW (ref >=50)
LDL Cholesterol (Calc): 107 mg/dL (calc) — ABNORMAL HIGH
Non-HDL Cholesterol (Calc): 126 mg/dL (calc) (ref <130)
Total CHOL/HDL Ratio: 3.7 (calc) (ref <5.0)
Triglycerides: 91 mg/dL (ref <150)

## 2020-07-06 LAB — COMPREHENSIVE METABOLIC PANEL
AG Ratio: 1.4 (calc) (ref 1.0–2.5)
ALT: 7 U/L (ref 6–29)
AST: 12 U/L (ref 10–35)
Albumin: 3.9 g/dL (ref 3.6–5.1)
Alkaline phosphatase (APISO): 109 U/L (ref 31–125)
BUN: 11 mg/dL (ref 7–25)
CO2: 27 mmol/L (ref 20–32)
Calcium: 9.8 mg/dL (ref 8.6–10.2)
Chloride: 105 mmol/L (ref 98–110)
Creat: 0.86 mg/dL (ref 0.50–1.10)
Globulin: 2.7 g/dL (calc) (ref 1.9–3.7)
Glucose, Bld: 91 mg/dL (ref 65–99)
Potassium: 4 mmol/L (ref 3.5–5.3)
Sodium: 140 mmol/L (ref 135–146)
Total Bilirubin: 0.3 mg/dL (ref 0.2–1.2)
Total Protein: 6.6 g/dL (ref 6.1–8.1)

## 2020-07-06 LAB — VITAMIN D 25 HYDROXY (VIT D DEFICIENCY, FRACTURES): Vit D, 25-Hydroxy: 62 ng/mL (ref 30–100)

## 2020-07-06 LAB — HIV ANTIBODY (ROUTINE TESTING W REFLEX): HIV 1&2 Ab, 4th Generation: NONREACTIVE

## 2020-07-06 LAB — VITAMIN B12: Vitamin B-12: 512 pg/mL (ref 200–1100)

## 2020-07-07 ENCOUNTER — Other Ambulatory Visit: Payer: Self-pay | Admitting: *Deleted

## 2020-07-07 DIAGNOSIS — R202 Paresthesia of skin: Secondary | ICD-10-CM

## 2020-07-10 ENCOUNTER — Encounter: Payer: Self-pay | Admitting: Neurology

## 2020-07-12 ENCOUNTER — Other Ambulatory Visit (HOSPITAL_COMMUNITY): Payer: Self-pay | Admitting: Emergency Medicine

## 2020-08-14 ENCOUNTER — Ambulatory Visit (HOSPITAL_COMMUNITY)
Admission: RE | Admit: 2020-08-14 | Discharge: 2020-08-14 | Disposition: A | Discharge status: Home / Self Care | Payer: 59 | Source: Ambulatory Visit | Attending: Family Medicine | Admitting: Family Medicine

## 2020-08-14 ENCOUNTER — Inpatient Hospital Stay (HOSPITAL_COMMUNITY): Payer: 59 | Attending: Medical

## 2020-08-14 ENCOUNTER — Ambulatory Visit (HOSPITAL_COMMUNITY): Payer: 59

## 2020-08-14 ENCOUNTER — Other Ambulatory Visit: Payer: Self-pay

## 2020-08-14 DIAGNOSIS — C50311 Malignant neoplasm of lower-inner quadrant of right female breast: Secondary | ICD-10-CM

## 2020-08-14 DIAGNOSIS — Z79811 Long term (current) use of aromatase inhibitors: Secondary | ICD-10-CM | POA: Insufficient documentation

## 2020-08-14 DIAGNOSIS — Z170 Estrogen receptor positive status [ER+]: Secondary | ICD-10-CM | POA: Insufficient documentation

## 2020-08-14 DIAGNOSIS — Z9011 Acquired absence of right breast and nipple: Secondary | ICD-10-CM | POA: Insufficient documentation

## 2020-08-14 DIAGNOSIS — D0511 Intraductal carcinoma in situ of right breast: Secondary | ICD-10-CM | POA: Diagnosis present

## 2020-08-14 DIAGNOSIS — M8580 Other specified disorders of bone density and structure, unspecified site: Secondary | ICD-10-CM | POA: Diagnosis not present

## 2020-08-14 DIAGNOSIS — Z9221 Personal history of antineoplastic chemotherapy: Secondary | ICD-10-CM | POA: Diagnosis not present

## 2020-08-14 DIAGNOSIS — E559 Vitamin D deficiency, unspecified: Secondary | ICD-10-CM

## 2020-08-14 LAB — CBC WITH DIFFERENTIAL/PLATELET
Abs Immature Granulocytes: 0.01 10*3/uL (ref 0.00–0.07)
Basophils Absolute: 0 10*3/uL (ref 0.0–0.1)
Basophils Relative: 1 %
Eosinophils Absolute: 0.2 10*3/uL (ref 0.0–0.5)
Eosinophils Relative: 3 %
HCT: 36.4 % (ref 36.0–46.0)
Hemoglobin: 11.8 g/dL — ABNORMAL LOW (ref 12.0–15.0)
Immature Granulocytes: 0 %
Lymphocytes Relative: 29 %
Lymphs Abs: 2 10*3/uL (ref 0.7–4.0)
MCH: 28.5 pg (ref 26.0–34.0)
MCHC: 32.4 g/dL (ref 30.0–36.0)
MCV: 87.9 fL (ref 80.0–100.0)
Monocytes Absolute: 0.5 10*3/uL (ref 0.1–1.0)
Monocytes Relative: 7 %
Neutro Abs: 4.3 10*3/uL (ref 1.7–7.7)
Neutrophils Relative %: 60 %
Platelets: 268 10*3/uL (ref 150–400)
RBC: 4.14 MIL/uL (ref 3.87–5.11)
RDW: 14.9 % (ref 11.5–15.5)
WBC: 7 10*3/uL (ref 4.0–10.5)
nRBC: 0 % (ref 0.0–0.2)

## 2020-08-14 LAB — COMPREHENSIVE METABOLIC PANEL
ALT: 10 U/L (ref 0–44)
AST: 14 U/L — ABNORMAL LOW (ref 15–41)
Albumin: 4 g/dL (ref 3.5–5.0)
Alkaline Phosphatase: 98 U/L (ref 38–126)
Anion gap: 8 (ref 5–15)
BUN: 18 mg/dL (ref 6–20)
CO2: 27 mmol/L (ref 22–32)
Calcium: 9.5 mg/dL (ref 8.9–10.3)
Chloride: 105 mmol/L (ref 98–111)
Creatinine, Ser: 0.94 mg/dL (ref 0.44–1.00)
GFR, Estimated: >60 mL/min (ref >60)
Glucose, Bld: 100 mg/dL — ABNORMAL HIGH (ref 70–99)
Potassium: 3.8 mmol/L (ref 3.5–5.1)
Sodium: 140 mmol/L (ref 135–145)
Total Bilirubin: 0.6 mg/dL (ref 0.3–1.2)
Total Protein: 7.4 g/dL (ref 6.5–8.1)

## 2020-08-14 LAB — VITAMIN D 25 HYDROXY (VIT D DEFICIENCY, FRACTURES): Vit D, 25-Hydroxy: 84.97 ng/mL (ref 30–100)

## 2020-08-15 ENCOUNTER — Encounter: Payer: Self-pay | Admitting: *Deleted

## 2020-08-17 ENCOUNTER — Inpatient Hospital Stay (HOSPITAL_BASED_OUTPATIENT_CLINIC_OR_DEPARTMENT_OTHER): Payer: 59 | Admitting: Hematology and Oncology

## 2020-08-17 ENCOUNTER — Other Ambulatory Visit: Payer: Self-pay

## 2020-08-17 ENCOUNTER — Encounter (HOSPITAL_COMMUNITY): Payer: Self-pay | Admitting: Hematology and Oncology

## 2020-08-17 VITALS — BP 104/72 | HR 73 | Temp 98.9°F | Resp 18 | Wt 188.9 lb

## 2020-08-17 DIAGNOSIS — D0511 Intraductal carcinoma in situ of right breast: Secondary | ICD-10-CM | POA: Diagnosis not present

## 2020-08-17 DIAGNOSIS — C50311 Malignant neoplasm of lower-inner quadrant of right female breast: Secondary | ICD-10-CM

## 2020-08-17 DIAGNOSIS — Z170 Estrogen receptor positive status [ER+]: Secondary | ICD-10-CM | POA: Diagnosis not present

## 2020-08-17 NOTE — Progress Notes (Signed)
[place], [place] [address]   CLINIC:  Medical Oncology/Hematology  PCP:  Alycia Rossetti, MD [address] [place] [address] [phone]   REASON FOR VISIT: Follow-up for right breast cancer ER+/PR+/HER 2-  CURRENT THERAPY: Arimidex  BRIEF ONCOLOGIC HISTORY:  Oncology History  Breast cancer of lower-inner quadrant of right female breast ([place])  06/07/2014 Initial Biopsy   Right breast needle biopsy 5:00 position: Invasive ductal carcinoma with DCIS, ER 100%, PR 71%, Ki-67 33%, HER-2 negative ratio 1.03   06/17/2014 Breast MRI   Right breast: 10 x 7 x 5 mm biopsy-proven IDC with DCIS, left breast 7 x 7 x 7 mm fibroadenoma, left upper quadrant of the abdomen abutting the peritoneum 3 x 1.1 cm oval soft tissue mass   07/27/2014 Surgery   Right breast mastectomy: Invasive ductal carcinoma grade 3, 2 cm, intermediate grade DCIS, lymphovascular invasion identified, 2 SLN negative, T1 C. N0 M0 stage IA, ER positive, PR 7%, HER-2 negative, Ki-67 33%   07/27/2014 Oncotype testing   Recurrence Score of 24, placing patient in the intermediate risk group   09/06/2014 -  Chemotherapy   Taxotere/Cytoxan    09/08/2014 Adverse Reaction   Nasuea and voming three times x 2 days.  Added Aloxi and Emend to anti-emetic regimen.   10/18/2014 -  Chemotherapy   Zoladex   10/27/2014 Adverse Reaction   Hives, secondary to Zoladex?   02/16/2015 Surgery   Laparoscopic BSO with Dr. Alycia Rossetti      CANCER STAGING: Cancer Staging Breast cancer of lower-inner quadrant of right female breast [employer]) Staging form: Breast, AJCC 7th Edition - Clinical: Stage IA (T1b, N0, cM0) - Unsigned - Pathologic: Stage IA (T1c, N0, cM0) - Signed by Seward Grater, MD on 09/13/2014    INTERVAL HISTORY:   Lydia Stevens 49 y.o. female returns for routine follow-up for right breast cancer. She is here today and doing well tolerating her Arimidex. She has no complaints  at this time. Denies any nausea, vomiting, or diarrhea. Denies any new pains. Had not noticed any recent bleeding such as epistaxis, hematuria or hematochezia. Denies recent chest pain on exertion, shortness of breath on minimal exertion, pre-syncopal episodes, or palpitations. Denies any numbness or tingling in hands or feet. Denies any recent fevers, infections, or recent hospitalizations. Denies any jaw pains. She reports her appetite and energy level at 100%. She lives at home and performs all her own ADLs and activities. She is still working full time.      REVIEW OF SYSTEMS:  Review of Systems  All other systems reviewed and are negative.    PAST MEDICAL/SURGICAL HISTORY:  Past Medical History:  Diagnosis Date   Anemia    before    Blood transfusion without reported diagnosis    Breast cancer ([facility])    2015   Cancer ([facility])    Phreesia 07/02/2020   GERD (gastroesophageal reflux disease)    History of breast cancer 12/29/2014   Hives Mar 3rd 2016   Hyperlipidemia    Osteoporosis 08/18/2015   Scoliosis    Past Surgical History:  Procedure Laterality Date   BREAST RECONSTRUCTION Right 12/06/2014   Procedure: RIGHT NIPPLE AREOLAR RECONSTRUCTION WITH FULL THICKNESS SKIN GRAFT FROM RIGHT THIGH;  Surgeon: Irene Limbo, MD;  Location: [facility];  Service: Plastics;  Laterality: Right;   BREAST REDUCTION SURGERY Left 12/06/2014   Procedure: LEFT BREAST REDUCTION FOR ASYMMETRY;  Surgeon: Arnoldo Hooker  Iran Planas, MD;  Location: [facility];  Service: Plastics;  Laterality: Left;   BREAST SURGERY N/A    Phreesia 07/02/2020   CESAREAN SECTION  1996   CESAREAN SECTION N/A    Phreesia 07/02/2020   LATISSIMUS FLAP TO BREAST Right 07/27/2014   Procedure: TRAM FLAP RECONSTUCTION RIGHT CHEST;  Surgeon: Irene Limbo, MD;  Location: [facility];  Service: Plastics;  Laterality: Right;   MASTECTOMY W/ SENTINEL NODE BIOPSY Right 07/27/2014   Procedure: RIGHT  MASTECTOMY WITH RIGHT AXILLARY SENTINEL LYMPH NODE BIOPSY;  Surgeon: Alphonsa Overall, MD;  Location: [facility];  Service: General;  Laterality: Right;   OOPHORECTOMY  2016   PORT-A-CATH REMOVAL Left 12/06/2014   Procedure: REMOVAL PORT-A-CATH;  Surgeon: Irene Limbo, MD;  Location: [facility];  Service: Plastics;  Laterality: Left;   PORTACATH PLACEMENT Left 09/05/2013   WISDOM TOOTH EXTRACTION       SOCIAL HISTORY:  Social History   Socioeconomic History   Marital status: Married    Spouse name: Not on file   Number of children: 1   Years of education: Not on file   Highest education level: Not on file  Occupational History   Not on file  Tobacco Use   Smoking status: Never Smoker   Smokeless tobacco: Never Used  Substance and Sexual Activity   Alcohol use: No    Alcohol/week: 0.0 standard drinks   Drug use: No   Sexual activity: Yes    Birth control/protection: None  Other Topics Concern   Not on file  Social History Narrative   Not on file   Social Determinants of Health   Financial Resource Strain: Not on file  Food Insecurity: Not on file  Transportation Needs: Not on file  Physical Activity: Not on file  Stress: Not on file  Social Connections: Not on file  Intimate Partner Violence: Not on file    FAMILY HISTORY:  Family History  Problem Relation Age of Onset   Heart attack Father    Arthritis Father    Heart disease Father        Massive MI   Breast cancer Maternal Aunt 13   Breast cancer Sister 83   Cancer Sister    Breast cancer Maternal Aunt 69   Arthritis Mother    Hypertension Mother    Asthma Sister    Alcohol abuse Brother    Hypertension Brother    Cancer Sister        1/2 - breast cancer   Breast cancer Paternal Aunt 59    CURRENT MEDICATIONS:  Outpatient Encounter Medications as of 08/17/2020  Medication Sig   anastrozole (ARIMIDEX) 1 MG tablet TAKE 1 TABLET(1 MG) BY MOUTH DAILY    simvastatin (ZOCOR) 10 MG tablet TAKE 1 TABLET(10 MG) BY MOUTH EVERY EVENING   cephALEXin (KEFLEX) 500 MG capsule Take 1 capsule (500 mg total) by mouth 2 (two) times daily. (Patient not taking: Reported on 08/17/2020)   Facility-Administered Encounter Medications as of 08/17/2020  Medication   diphenhydrAMINE (BENADRYL) injection 50 mg    ALLERGIES:  Allergies  Allergen Reactions   Oxycodone Nausea Only     PHYSICAL EXAM:  ECOG Performance status: 1  Vitals:   08/17/20 0810 08/17/20 0812  BP: (!) 112/34 104/72  Pulse: 73   Resp: 18   Temp: 98.9 F (37.2 C)   SpO2: 100%    Filed Weights   08/17/20 0810  Weight: 188 lb 14.4 oz (85.7 kg)  Physical Exam Constitutional:      Appearance: Normal appearance. She is normal weight.  Musculoskeletal:        General: Normal range of motion.  Skin:    General: Skin is warm and dry.  Neurological:     Mental Status: She is alert and oriented to person, place, and time. Mental status is at baseline.  Psychiatric:        Mood and Affect: Mood normal.        Behavior: Behavior normal.        Thought Content: Thought content normal.        Judgment: Judgment normal.   Breast: RIGHT: TRAM flap.  No palpable masses, no skin changes or no adenopathy.              LEFT: No palpable masses, no skin changes or nipple discharge, no adenopathy.    LABORATORY DATA:  I have reviewed the labs as listed.  CBC    Component Value Date/Time   WBC 7.0 08/14/2020 0804   RBC 4.14 08/14/2020 0804   HGB 11.8 (L) 08/14/2020 0804   HGB 11.7 09/05/2014 0914   HCT 36.4 08/14/2020 0804   HCT 35.4 09/05/2014 0914   PLT 268 08/14/2020 0804   PLT 297 09/05/2014 0914   MCV 87.9 08/14/2020 0804   MCV 84.9 09/05/2014 0914   MCH 28.5 08/14/2020 0804   MCHC 32.4 08/14/2020 0804   RDW 14.9 08/14/2020 0804   RDW 14.9 (H) 09/05/2014 0914   LYMPHSABS 2.0 08/14/2020 0804   LYMPHSABS 1.6 09/05/2014 0914   MONOABS 0.5 08/14/2020 0804   MONOABS  0.5 09/05/2014 0914   EOSABS 0.2 08/14/2020 0804   EOSABS 0.2 09/05/2014 0914   BASOSABS 0.0 08/14/2020 0804   BASOSABS 0.0 09/05/2014 0914   CMP Latest Ref Rng & Units 08/14/2020 07/05/2020 08/17/2019  Glucose 70 - 99 mg/dL 100(H) 91 82  BUN 6 - 20 mg/dL _0 Creatinine 0.44 - 1.00 mg/dL 0.94 0.86 0.95  Sodium 135 - 145 mmol/L 140 140 142  Potassium 3.5 - 5.1 mmol/L 3.8 4.0 3.7  Chloride 98 - 111 mmol/L 105 105 105  CO2 22 - 32 mmol/L _1 Calcium 8.9 - 10.3 mg/dL 9.5 9.8 9.5  Total Protein 6.5 - 8.1 g/dL 7.4 6.6 7.4  Total Bilirubin 0.3 - 1.2 mg/dL 0.6 0.3 0.5  Alkaline Phos 38 - 126 U/L 98 - 67  AST 15 - 41 U/L 14(L) 12 16  ALT 0 - 44 U/L _2 DIAGNOSTIC IMAGING:  I have independently reviewed the scans and discussed with the patient.   I have reviewed Lydia Finders, NP's note and agree with the documentation.  I personally performed a face-to-face visit, made revisions and my assessment and plan is as follows.  ASSESSMENT & PLAN:   No problem-specific Assessment & Plan notes found for this encounter.  1.  Stage I (PT1CPN0) right breast IDC:  -Status post right mastectomy followed by TRAM flap in December 2015, 2 cm IDC, grade 3, 2 sentinel lymph node negative, ER/PR positive, HER-2 negative, Ki-67 33%. -Oncotype DX recurrence score of 24. -4 cycles of TC from 09/06/2014 through 11/08/2014. -She is tolerating anastrozole very well, which was started around April 2016. -We discussed the options of BCI testing versus continuing anastrozole beyond 5 years.  As she is tolerating it very well, she is inclined to proceed with long-term antiestrogen therapy. -We reviewed her  left breast mammogram dated 08/13/2019 which was BI-RADS Category 1.  Mammogram this year is pending, Dr Raliegh Ip will let her know of the results. -Annual exam  2.  Bone health: -DEXA scan showed osteopenia. -I have told her to continue calcium and vitamin D supplements.  I have told her to  start doing weightbearing exercises. -I plan to repeat DEXA scan in 2 years.    Orders placed this encounter:  No orders of the defined types were placed in this encounter.   Benay Pike MD

## 2020-08-17 NOTE — Patient Instructions (Signed)
[facility] at [facility] Discharge Instructions  You were seen today by Dr. Iruku. Follow up as scheduled.   Thank you for choosing [facility] at [facility] to provide your oncology and hematology care.  To afford each patient quality time with our provider, please arrive at least 15 minutes before your scheduled appointment time.   If you have a lab appointment with the Cancer Center please come in thru the Main Entrance and check in at the main information desk.  You need to re-schedule your appointment should you arrive 10 or more minutes late.  We strive to give you quality time with our providers, and arriving late affects you and other patients whose appointments are after yours.  Also, if you no show three or more times for appointments you may be dismissed from the clinic at the providers discretion.     Again, thank you for choosing [facility].  Our hope is that these requests will decrease the amount of time that you wait before being seen by our physicians.       _____________________________________________________________  Should you have questions after your visit to [facility], please contact our office at [phone] and follow the prompts.  Our office hours are 8:00 a.m. and 4:30 p.m. Monday - Friday.  Please note that voicemails left after 4:00 p.m. may not be returned until the following business day.  We are closed weekends and major holidays.  You do have access to a nurse 24-7, just call the main number to the clinic [phone] and do not press any options, hold on the line and a nurse will answer the phone.    For prescription refill requests, have your pharmacy contact our office and allow 72 hours.    Due to Covid, you will need to wear a mask upon entering the hospital. If you do not have a mask, a mask will be given to you at the Main Entrance upon arrival. For doctor visits, patients may  have 1 support person age 18 or older with them. For treatment visits, patients can not have anyone with them due to social distancing guidelines and our immunocompromised population.     

## 2020-08-22 ENCOUNTER — Encounter: Payer: 59 | Admitting: Family Medicine

## 2020-09-19 ENCOUNTER — Other Ambulatory Visit (HOSPITAL_COMMUNITY): Payer: Self-pay | Admitting: Hematology

## 2020-10-02 ENCOUNTER — Ambulatory Visit (HOSPITAL_COMMUNITY): Admission: RE | Admit: 2020-10-02 | Payer: 59 | Source: Ambulatory Visit

## 2020-10-04 ENCOUNTER — Other Ambulatory Visit: Payer: Self-pay

## 2020-10-04 ENCOUNTER — Ambulatory Visit (HOSPITAL_COMMUNITY)
Admission: RE | Admit: 2020-10-04 | Discharge: 2020-10-04 | Disposition: A | Discharge status: Home / Self Care | Payer: 59 | Source: Ambulatory Visit | Attending: Hematology | Admitting: Hematology

## 2020-10-04 DIAGNOSIS — Z1231 Encounter for screening mammogram for malignant neoplasm of breast: Secondary | ICD-10-CM | POA: Insufficient documentation

## 2020-10-09 ENCOUNTER — Other Ambulatory Visit: Payer: Self-pay

## 2020-10-09 ENCOUNTER — Encounter: Payer: Self-pay | Admitting: Family Medicine

## 2020-10-09 ENCOUNTER — Ambulatory Visit (INDEPENDENT_AMBULATORY_CARE_PROVIDER_SITE_OTHER): Payer: 59 | Admitting: Family Medicine

## 2020-10-09 ENCOUNTER — Ambulatory Visit: Payer: 59 | Admitting: Neurology

## 2020-10-09 DIAGNOSIS — M8580 Other specified disorders of bone density and structure, unspecified site: Secondary | ICD-10-CM

## 2020-10-09 DIAGNOSIS — E785 Hyperlipidemia, unspecified: Secondary | ICD-10-CM

## 2020-10-09 MED ORDER — CALCIUM CARBONATE 600 MG PO TABS: 600.0000 mg | ORAL_TABLET | Freq: Two times a day (BID) | ORAL | Status: DC

## 2020-10-09 MED ORDER — VITAMIN D 25 MCG (1000 UNIT) PO TABS: 1000.0000 [IU] | ORAL_TABLET | Freq: Every day | ORAL | Status: DC

## 2020-10-09 NOTE — Progress Notes (Signed)
   Subjective:    Patient ID: Lydia Stevens, female    DOB: Jun 14, 1971, 50 y.o.   MRN: [id number]  Patient presents for Follow-up (Is not fasting/) Patient here to follow-up medications.  She has history of breast cancer.  She had mammogram last week for follow-up of her left side.  She is still awaiting the results.  Nothing is in the computer at this time.  She is still on her Arimidex.  She was seen at her physical for leg discomfort with neuropathic symptoms.  She was scheduled with neurology but due to the weather had to reschedule now her appointment is March 28.  Osteopenia noted on her bone density she is on calcium and vitamin D.  He has noted a little constipation with the calcium.   Review Of Systems:  GEN- denies fatigue, fever, weight loss,weakness, recent illness HEENT- denies eye drainage, change in vision, nasal discharge, CVS- denies chest pain, palpitations RESP- denies SOB, cough, wheeze ABD- denies N/V, change in stools, abd pain GU- denies dysuria, hematuria, dribbling, incontinence MSK- denies joint pain, muscle aches, injury Neuro- denies headache, dizziness, syncope, seizure activity       Objective:    BP 124/66   Pulse 78   Temp 98 F (36.7 C) (Temporal)   Resp 14   Ht 5\' 8"  (1.727 m)   Wt 189 lb (85.7 kg)   LMP 06/19/2016   SpO2 98%   BMI 28.74 kg/m  GEN- NAD, alert and oriented x3 CVS- RRR, no murmur RESP-CTAB EXT- No edema Pulses- Radial 2+        Assessment & Plan:      Problem List Items Addressed This Visit      Unprioritized   Hyperlipidemia    On  statin drug Repeat labs in November at [facility]      Osteopenia    Continue vitamin D and calcium.  Advised her to take calcium twice a day  Recheck in 2 years         Note: This dictation was prepared with Dragon dictation along with smaller phrase technology. Any transcriptional errors that result from this process are unintentional.

## 2020-10-09 NOTE — Assessment & Plan Note (Signed)
On  statin drug Repeat labs in November at [facility]

## 2020-10-09 NOTE — Patient Instructions (Addendum)
F/U Lydia Stevens MID-  November for Physical

## 2020-10-09 NOTE — Assessment & Plan Note (Addendum)
Continue vitamin D and calcium.  Advised her to take calcium twice a day  Recheck in 2 years

## 2020-10-11 ENCOUNTER — Encounter (HOSPITAL_COMMUNITY): Payer: Self-pay

## 2020-10-19 ENCOUNTER — Other Ambulatory Visit: Payer: Self-pay | Admitting: Family Medicine

## 2020-10-19 DIAGNOSIS — E785 Hyperlipidemia, unspecified: Secondary | ICD-10-CM

## 2020-11-20 ENCOUNTER — Other Ambulatory Visit: Payer: Self-pay

## 2020-11-20 ENCOUNTER — Encounter: Payer: Self-pay | Admitting: Neurology

## 2020-11-20 ENCOUNTER — Ambulatory Visit (INDEPENDENT_AMBULATORY_CARE_PROVIDER_SITE_OTHER): Payer: 59 | Admitting: Neurology

## 2020-11-20 VITALS — BP 109/74 | HR 75 | Ht 68.0 in | Wt 187.0 lb

## 2020-11-20 DIAGNOSIS — R292 Abnormal reflex: Secondary | ICD-10-CM

## 2020-11-20 DIAGNOSIS — R202 Paresthesia of skin: Secondary | ICD-10-CM

## 2020-11-20 DIAGNOSIS — G9519 Other vascular myelopathies: Secondary | ICD-10-CM | POA: Diagnosis not present

## 2020-11-20 DIAGNOSIS — Z853 Personal history of malignant neoplasm of breast: Secondary | ICD-10-CM | POA: Diagnosis not present

## 2020-11-20 NOTE — Progress Notes (Signed)
[facility] Clinic Note - Initial Visit   Date: 11/20/20  Lydia Stevens MRN: [id number] DOB: 08-08-1971   Dear Dr. Buelah Manis:  Thank you for your kind referral of Lydia Stevens for consultation of numbness. Although her history is well known to you, please allow [name] to reiterate it for the purpose of our medical record. The patient was accompanied to the clinic by self.    History of Present Illness: Lydia Stevens is a 50 y.o. right-handed female with right breast cancer (2015, s/p mastectomy and chemotherapy) and hyperlipidemia presenting for evaluation of numbness.   Starting around August 2021, she began having right lower leg and foot numbness which is is triggered by prolonged standing, such as when preparing meals and walking (~20-min). She does not have heaviness or weakness in the legs.  Her symptoms quickly resolve with resting. She denies having problems in the left leg. She denies low back pain.  Symptoms were occurring much more frequently and quicker, to the point where she was having to abbreviate her meal prep or completely avoid cooking. Her numbness has becomes less frequent, but continues to occur with prolonged standing/walking.   In early 2021, she also began noticing numbness around the right lower lip.  It can occur when she is in the tub taking a bath or laying down in the bed to watch TV. Symptoms quickly resolve with repositioning. No dysphagia, dysarthria, or facial weakness.   Out-side paper records, electronic medical record, and images have been reviewed where available and summarized as:  Lab Results  Component Value Date   HGBA1C 5.5 07/05/2020   Lab Results  Component Value Date   VITAMINB12 512 07/05/2020   Lab Results  Component Value Date   TSH 1.01 05/11/2018     Past Medical History:  Diagnosis Date  . Anemia    before   . Blood transfusion without reported diagnosis   . Breast cancer ([facility])     2015  . Cancer ([place] [place])    Phreesia 07/02/2020  . GERD (gastroesophageal reflux disease)   . History of breast cancer 12/29/2014  . Hives Mar 3rd 2016  . Hyperlipidemia   . Osteoporosis 08/18/2015  . Scoliosis     Past Surgical History:  Procedure Laterality Date  . BREAST RECONSTRUCTION Right 12/06/2014   Procedure: RIGHT NIPPLE AREOLAR RECONSTRUCTION WITH FULL THICKNESS SKIN GRAFT FROM RIGHT THIGH;  Surgeon: Irene Limbo, MD;  Location: [facility];  Service: Plastics;  Laterality: Right;  . BREAST REDUCTION SURGERY Left 12/06/2014   Procedure: LEFT BREAST REDUCTION FOR ASYMMETRY;  Surgeon: Irene Limbo, MD;  Location: [facility];  Service: Plastics;  Laterality: Left;  . BREAST SURGERY N/A    Phreesia 07/02/2020  . CESAREAN SECTION  1996  . CESAREAN SECTION N/A    Phreesia 07/02/2020  . LATISSIMUS FLAP TO BREAST Right 07/27/2014   Procedure: TRAM FLAP RECONSTUCTION RIGHT CHEST;  Surgeon: Irene Limbo, MD;  Location: [facility];  Service: Plastics;  Laterality: Right;  . MASTECTOMY Right   . MASTECTOMY W/ SENTINEL NODE BIOPSY Right 07/27/2014   Procedure: RIGHT MASTECTOMY WITH RIGHT AXILLARY SENTINEL LYMPH NODE BIOPSY;  Surgeon: Alphonsa Overall, MD;  Location: [facility];  Service: General;  Laterality: Right;  . OOPHORECTOMY  2016  . PORT-A-CATH REMOVAL Left 12/06/2014   Procedure: REMOVAL PORT-A-CATH;  Surgeon: Irene Limbo, MD;  Location: [facility];  Service: Plastics;  Laterality: Left;  . PORTACATH PLACEMENT Left 09/05/2013  .  REDUCTION MAMMAPLASTY Left   . WISDOM TOOTH EXTRACTION       Medications:  Outpatient Encounter Medications as of 11/20/2020  Medication Sig  . anastrozole (ARIMIDEX) 1 MG tablet TAKE 1 TABLET(1 MG) BY MOUTH DAILY  . calcium carbonate (CALCIUM 600) 600 MG TABS tablet Take 1 tablet (600 mg total) by mouth 2 (two) times daily with a meal.  . cholecalciferol (VITAMIN D3) 25 MCG (1000 UNIT) tablet Take 1 tablet  (1,000 Units total) by mouth daily.  . simvastatin (ZOCOR) 10 MG tablet TAKE 1 TABLET(10 MG) BY MOUTH EVERY EVENING   Facility-Administered Encounter Medications as of 11/20/2020  Medication  . diphenhydrAMINE (BENADRYL) injection 50 mg    Allergies:  Allergies  Allergen Reactions  . Oxycodone Nausea Only    Family History: Family History  Problem Relation Age of Onset  . Heart attack Father   . Arthritis Father   . Heart disease Father        Massive MI  . Breast cancer Maternal Aunt 37  . Breast cancer Sister 59  . Cancer Sister   . Breast cancer Maternal Aunt 42  . Arthritis Mother   . Hypertension Mother   . Asthma Sister   . Alcohol abuse Brother   . Hypertension Brother   . Cancer Sister        1/2 - breast cancer  . Breast cancer Paternal Aunt 31    Social History: Social History   Tobacco Use  . Smoking status: Never Smoker  . Smokeless tobacco: Never Used  Vaping Use  . Vaping Use: Never used  Substance Use Topics  . Alcohol use: No    Alcohol/week: 0.0 standard drinks  . Drug use: No   Social History   Social History Narrative   Right Handed   Lives in a one story home    Drinks Caffeine Occasionally     Vital Signs:  BP 109/74   Pulse 75   Ht 5\' 8"  (1.727 m)   Wt 187 lb (84.8 kg)   LMP 06/19/2016   SpO2 96%   BMI 28.43 kg/m   Neurological Exam: MENTAL STATUS including orientation to time, place, person, recent and remote memory, attention span and concentration, language, and fund of knowledge is normal.  Speech is not dysarthric.  CRANIAL NERVES: II:  No visual field defects.    III-IV-VI: Pupils equal round and reactive to light.  Normal conjugate, extra-ocular eye movements in all directions of gaze.  No nystagmus.  No ptosis.   V:  Normal facial sensation.    VII:  Normal facial symmetry and movements.   VIII:  Normal hearing and vestibular function.   IX-X:  Normal palatal movement.   XI:  Normal shoulder shrug and head  rotation.   XII:  Normal tongue strength and range of motion, no deviation or fasciculation.  MOTOR:  No atrophy, fasciculations or abnormal movements.  No pronator drift.   Upper Extremity:  Right  Left  Deltoid  5/5   5/5   Biceps  5/5   5/5   Triceps  5/5   5/5   Infraspinatus 5/5  5/5  Medial pectoralis 5/5  5/5  Wrist extensors  5/5   5/5   Wrist flexors  5/5   5/5   Finger extensors  5/5   5/5   Finger flexors  5/5   5/5   Dorsal interossei  5/5   5/5   Abductor pollicis  5/5   5/5  Tone (Ashworth scale)  0  0   Lower Extremity:  Right  Left  Hip flexors  5/5   5/5   Hip extensors  5/5   5/5   Adductor 5/5  5/5  Abductor 5/5  5/5  Knee flexors  5/5   5/5   Knee extensors  5/5   5/5   Dorsiflexors  5/5   5/5   Plantarflexors  5/5   5/5   Toe extensors  5/5   5/5   Toe flexors  5/5   5/5   Tone (Ashworth scale)  0  0   MSRs:  Right        Left                  brachioradialis 2+  2+  biceps 2+  2+  triceps 2+  2+  patellar 3+  2+  ankle jerk 2+  2+  Hoffman no  no  plantar response down  down   SENSORY:  Normal and symmetric perception of light touch, pinprick, vibration, and proprioception.  Romberg's sign absent.   COORDINATION/GAIT: Normal finger-to- nose-finger and heel-to-shin.  Intact rapid alternating movements bilaterally.  Gait narrow based and stable. Tandem and stressed gait intact.    IMPRESSION: 1. Possible neurogenic claudication causing right leg numbness  - MRI lumbar spine wwo contrast (constrasted study given history of cancer)  2. Right lip paresthesias - intermittent.  Exam is normal, however, with history of malignancy, imaging will be ordered to evaluate for any structural pathology.  Vitamin B12 is normal.   - MRI brain wwo contrast  Further recommendations pending results.   Thank you for allowing me to participate in patient's care.  If I can answer any additional questions, I would be pleased to do so.     Sincerely,    Angus Amini K. Posey Pronto, DO

## 2020-11-20 NOTE — Patient Instructions (Addendum)
MRI brain and lumbar spine will be ordered  We will contact you with the results and let you know the next step.

## 2020-12-06 ENCOUNTER — Ambulatory Visit
Admission: RE | Admit: 2020-12-06 | Discharge: 2020-12-06 | Disposition: A | Discharge status: Home / Self Care | Payer: 59 | Source: Ambulatory Visit | Attending: Neurology | Admitting: Neurology

## 2020-12-06 ENCOUNTER — Other Ambulatory Visit: Payer: 59

## 2020-12-06 DIAGNOSIS — G9519 Other vascular myelopathies: Secondary | ICD-10-CM

## 2020-12-06 DIAGNOSIS — R202 Paresthesia of skin: Secondary | ICD-10-CM

## 2020-12-06 DIAGNOSIS — Z853 Personal history of malignant neoplasm of breast: Secondary | ICD-10-CM

## 2020-12-06 MED ORDER — GADOBENATE DIMEGLUMINE 529 MG/ML IV SOLN
17.0000 mL | Freq: Once | INTRAVENOUS | Status: AC | PRN
  Administered 2020-12-06: 17 mL via INTRAVENOUS

## 2020-12-12 ENCOUNTER — Telehealth: Payer: Self-pay | Admitting: Neurology

## 2020-12-12 NOTE — Telephone Encounter (Signed)
I attempted to contact patient via phone today regarding the results of MRI brain, however there was no answer so a message was left that I will try her again tomorrow.

## 2020-12-13 ENCOUNTER — Telehealth: Payer: Self-pay | Admitting: Neurology

## 2020-12-13 NOTE — Telephone Encounter (Signed)
Called and discussed results of MRI brain which shows no intracranial abnormalities to explain her symptoms.  There are two meningiomas, one at the base of the skull.  I do not appreciate any mass effect to explain her lip paresthesias.    She is having less frequent numbness of the right leg.  MRI lumbar spine denied by insurance and recommended doing PT first.  Given that symptoms are intermittent and less frequent, it was elected to monitor.    She will follow-up in 4 months, or sooner, if symptoms get worse.

## 2021-02-19 ENCOUNTER — Other Ambulatory Visit: Payer: Self-pay | Admitting: *Deleted

## 2021-02-19 DIAGNOSIS — E785 Hyperlipidemia, unspecified: Secondary | ICD-10-CM

## 2021-02-19 MED ORDER — SIMVASTATIN 10 MG PO TABS: 10.0000 mg | ORAL_TABLET | Freq: Every day | ORAL | 3 refills | Status: DC

## 2021-03-18 ENCOUNTER — Other Ambulatory Visit (HOSPITAL_COMMUNITY): Payer: Self-pay | Admitting: Hematology

## 2021-04-19 ENCOUNTER — Ambulatory Visit: Payer: 59 | Admitting: Neurology

## 2021-07-16 ENCOUNTER — Other Ambulatory Visit: Payer: Self-pay

## 2021-07-16 ENCOUNTER — Other Ambulatory Visit: Payer: 59

## 2021-07-16 DIAGNOSIS — Z0000 Encounter for general adult medical examination without abnormal findings: Secondary | ICD-10-CM

## 2021-07-16 DIAGNOSIS — E559 Vitamin D deficiency, unspecified: Secondary | ICD-10-CM

## 2021-07-16 DIAGNOSIS — E785 Hyperlipidemia, unspecified: Secondary | ICD-10-CM

## 2021-07-17 LAB — CBC WITH DIFFERENTIAL/PLATELET
Absolute Monocytes: 443 cells/uL (ref 200–950)
Basophils Absolute: 38 cells/uL (ref 0–200)
Basophils Relative: 0.5 %
Eosinophils Absolute: 143 cells/uL (ref 15–500)
Eosinophils Relative: 1.9 %
HCT: 37.6 % (ref 35.0–45.0)
Hemoglobin: 12.4 g/dL (ref 11.7–15.5)
Lymphs Abs: 1935 cells/uL (ref 850–3900)
MCH: 28.3 pg (ref 27.0–33.0)
MCHC: 33 g/dL (ref 32.0–36.0)
MCV: 85.8 fL (ref 80.0–100.0)
MPV: 10.5 fL (ref 7.5–12.5)
Monocytes Relative: 5.9 %
Neutro Abs: 4943 cells/uL (ref 1500–7800)
Neutrophils Relative %: 65.9 %
Platelets: 266 10*3/uL (ref 140–400)
RBC: 4.38 10*6/uL (ref 3.80–5.10)
RDW: 13.7 % (ref 11.0–15.0)
Total Lymphocyte: 25.8 %
WBC: 7.5 10*3/uL (ref 3.8–10.8)

## 2021-07-17 LAB — COMPREHENSIVE METABOLIC PANEL
AG Ratio: 1.4 (calc) (ref 1.0–2.5)
ALT: 12 U/L (ref 6–29)
AST: 14 U/L (ref 10–35)
Albumin: 4.1 g/dL (ref 3.6–5.1)
Alkaline phosphatase (APISO): 90 U/L (ref 31–125)
BUN: 18 mg/dL (ref 7–25)
CO2: 30 mmol/L (ref 20–32)
Calcium: 9.9 mg/dL (ref 8.6–10.2)
Chloride: 104 mmol/L (ref 98–110)
Creat: 0.98 mg/dL (ref 0.50–0.99)
Globulin: 2.9 g/dL (calc) (ref 1.9–3.7)
Glucose, Bld: 82 mg/dL (ref 65–99)
Potassium: 4.3 mmol/L (ref 3.5–5.3)
Sodium: 142 mmol/L (ref 135–146)
Total Bilirubin: 0.3 mg/dL (ref 0.2–1.2)
Total Protein: 7 g/dL (ref 6.1–8.1)

## 2021-07-17 LAB — LIPID PANEL
Cholesterol: 180 mg/dL (ref <200)
HDL: 54 mg/dL (ref >=50)
LDL Cholesterol (Calc): 111 mg/dL (calc) — ABNORMAL HIGH
Non-HDL Cholesterol (Calc): 126 mg/dL (calc) (ref <130)
Total CHOL/HDL Ratio: 3.3 (calc) (ref <5.0)
Triglycerides: 65 mg/dL (ref <150)

## 2021-07-17 LAB — VITAMIN D 25 HYDROXY (VIT D DEFICIENCY, FRACTURES): Vit D, 25-Hydroxy: 97 ng/mL (ref 30–100)

## 2021-07-23 ENCOUNTER — Encounter: Payer: 59 | Admitting: Nurse Practitioner

## 2021-08-01 ENCOUNTER — Ambulatory Visit (INDEPENDENT_AMBULATORY_CARE_PROVIDER_SITE_OTHER): Payer: 59 | Admitting: Nurse Practitioner

## 2021-08-01 ENCOUNTER — Encounter: Payer: Self-pay | Admitting: Nurse Practitioner

## 2021-08-01 ENCOUNTER — Other Ambulatory Visit: Payer: Self-pay

## 2021-08-01 VITALS — BP 122/80 | HR 81 | Ht 68.0 in | Wt 187.2 lb

## 2021-08-01 DIAGNOSIS — Z1211 Encounter for screening for malignant neoplasm of colon: Secondary | ICD-10-CM

## 2021-08-01 DIAGNOSIS — Z0000 Encounter for general adult medical examination without abnormal findings: Secondary | ICD-10-CM | POA: Diagnosis not present

## 2021-08-01 DIAGNOSIS — Z131 Encounter for screening for diabetes mellitus: Secondary | ICD-10-CM

## 2021-08-01 DIAGNOSIS — Z853 Personal history of malignant neoplasm of breast: Secondary | ICD-10-CM | POA: Diagnosis not present

## 2021-08-01 DIAGNOSIS — R21 Rash and other nonspecific skin eruption: Secondary | ICD-10-CM

## 2021-08-01 DIAGNOSIS — Z124 Encounter for screening for malignant neoplasm of cervix: Secondary | ICD-10-CM

## 2021-08-01 DIAGNOSIS — Z13228 Encounter for screening for other metabolic disorders: Secondary | ICD-10-CM

## 2021-08-01 DIAGNOSIS — E559 Vitamin D deficiency, unspecified: Secondary | ICD-10-CM | POA: Diagnosis not present

## 2021-08-01 DIAGNOSIS — M8580 Other specified disorders of bone density and structure, unspecified site: Secondary | ICD-10-CM

## 2021-08-01 DIAGNOSIS — Z130 Encounter for screening for diseases of the blood and blood-forming organs and certain disorders involving the immune mechanism: Secondary | ICD-10-CM

## 2021-08-01 DIAGNOSIS — E785 Hyperlipidemia, unspecified: Secondary | ICD-10-CM

## 2021-08-01 MED ORDER — SIMVASTATIN 20 MG PO TABS: 20.0000 mg | ORAL_TABLET | Freq: Every day | ORAL | 3 refills | Status: DC

## 2021-08-01 NOTE — Assessment & Plan Note (Signed)
Chronic.  Patient maintains on vitamin D and calcium.  Recent vitamin D level normal.  We will plan to recheck DEXA scan in 2 years-she is currently following with oncology who is overseeing this.

## 2021-08-01 NOTE — Progress Notes (Signed)
BP 122/80   Pulse 81   Ht 5\' 8"  (1.727 m)   Wt 187 lb 3.2 oz (84.9 kg)   LMP 06/19/2016   SpO2 97%   BMI 28.46 kg/m    Subjective:    Patient ID: Lydia Stevens, female    DOB: 1971/02/09, 50 y.o.   MRN: [id number]  HPI: Lydia Stevens is a 50 y.o. female presenting on 08/01/2021 for comprehensive medical examination. Current medical complaints include:  HYPERLIPIDEMIA Currently taking simvastatin 10 mg daily and tolerating well. Hyperlipidemia status: recent LDL elevated above goal Satisfied with current treatment?  no Side effects:  no Medication compliance: excellent compliance The 10-year ASCVD risk score (Arnett DK, et al., 2019) is: 1.3%   Values used to calculate the score:     Age: 84 years     Sex: Female     Is Non-Hispanic African American: Yes     Diabetic: No     Tobacco smoker: No     Systolic Blood Pressure: 622 mmHg     Is BP treated: No     HDL Cholesterol: 54 mg/dL     Total Cholesterol: 180 mg/dL Chest pain:  no Myalgias: no Coronary artery disease:  no Family history CAD:  no Family history early CAD:  no LDL goal: less than 100  History of estrogen receptor positive breast cancer - sees oncologist yearly and appointment is later this month.  She is up to date on mammogram.  Does monthly self breast exams.  Currently tolerating anastrozole 1 mg daily well.  RASH Duration: 4-6 weeks  Location: in between gluteal folds History of trauma in area: no Pain: no Quality: no pain Itching: yes Severity: moderate; comes and goes Redness: no Swelling: no Oozing: no Pus: no Fevers: no Nausea/vomiting: no Status: better Aggravating factors: worse after bath; takes baths daily Treatments attempted: vaseline, aloe  She currently lives with: husband LMP: history of oophorectomy   Depression Screen done today and results listed below:  Depression screen [facility] 2/9 08/01/2021 10/09/2020 07/05/2020 02/23/2019 07/01/2018  Decreased Interest 0 0 0 0  0  Down, Depressed, Hopeless 0 0 0 0 0  PHQ - 2 Score 0 0 0 0 0  Altered sleeping - - 0 - -  Tired, decreased energy - - 0 - -  Change in appetite - - 0 - -  Feeling bad or failure about yourself  - - 0 - -  Trouble concentrating - - 0 - -  Moving slowly or fidgety/restless - - 0 - -  Suicidal thoughts - - 0 - -  PHQ-9 Score - - 0 - -  Difficult doing work/chores - - Not difficult at all - -  Some recent data might be hidden   The patient does not have a history of falls. I did not complete a risk assessment for falls. A plan of care for falls was not documented.   Past Medical History:  Past Medical History:  Diagnosis Date   Anemia    before    Blood transfusion without reported diagnosis    Breast cancer ([place])    2015   Cancer ([place])    Phreesia 07/02/2020   GERD (gastroesophageal reflux disease)    History of breast cancer 12/29/2014   Hives Mar 3rd 2016   Hyperlipidemia    Osteoporosis 08/18/2015   Scoliosis     Surgical History:  Past Surgical History:  Procedure Laterality Date   BREAST RECONSTRUCTION Right 12/06/2014  Procedure: RIGHT NIPPLE AREOLAR RECONSTRUCTION WITH FULL THICKNESS SKIN GRAFT FROM RIGHT THIGH;  Surgeon: Irene Limbo, MD;  Location: [place];  Service: Plastics;  Laterality: Right;   BREAST REDUCTION SURGERY Left 12/06/2014   Procedure: LEFT BREAST REDUCTION FOR ASYMMETRY;  Surgeon: Irene Limbo, MD;  Location: [place];  Service: Plastics;  Laterality: Left;   BREAST SURGERY N/A    Phreesia 07/02/2020   CESAREAN SECTION  1996   CESAREAN SECTION N/A    Phreesia 07/02/2020   LATISSIMUS FLAP TO BREAST Right 07/27/2014   Procedure: TRAM FLAP RECONSTUCTION RIGHT CHEST;  Surgeon: Irene Limbo, MD;  Location: [place];  Service: Plastics;  Laterality: Right;   MASTECTOMY Right    MASTECTOMY W/ SENTINEL NODE BIOPSY Right 07/27/2014   Procedure: RIGHT MASTECTOMY WITH RIGHT AXILLARY SENTINEL LYMPH NODE BIOPSY;   Surgeon: Alphonsa Overall, MD;  Location: [place];  Service: General;  Laterality: Right;   OOPHORECTOMY  2016   PORT-A-CATH REMOVAL Left 12/06/2014   Procedure: REMOVAL PORT-A-CATH;  Surgeon: Irene Limbo, MD;  Location: [place];  Service: Plastics;  Laterality: Left;   PORTACATH PLACEMENT Left 09/05/2013   REDUCTION MAMMAPLASTY Left    WISDOM TOOTH EXTRACTION      Medications:  Current Outpatient Medications on File Prior to Visit  Medication Sig   anastrozole (ARIMIDEX) 1 MG tablet TAKE 1 TABLET(1 MG) BY MOUTH DAILY   calcium carbonate (CALCIUM 600) 600 MG TABS tablet Take 1 tablet (600 mg total) by mouth 2 (two) times daily with a meal.   cholecalciferol (VITAMIN D3) 25 MCG (1000 UNIT) tablet Take 1 tablet (1,000 Units total) by mouth daily.   No current facility-administered medications on file prior to visit.    Allergies:  Allergies  Allergen Reactions   Oxycodone Nausea Only    Social History:  Social History   Socioeconomic History   Marital status: Married    Spouse name: Not on file   Number of children: 1   Years of education: Not on file   Highest education level: Not on file  Occupational History   Not on file  Tobacco Use   Smoking status: Never   Smokeless tobacco: Never  Vaping Use   Vaping Use: Never used  Substance and Sexual Activity   Alcohol use: No    Alcohol/week: 0.0 standard drinks   Drug use: No   Sexual activity: Yes    Birth control/protection: None  Other Topics Concern   Not on file  Social History Narrative   Right Handed   Lives in a one story home    Drinks Caffeine Occasionally    Social Determinants of Health   Financial Resource Strain: Not on file  Food Insecurity: Not on file  Transportation Needs: Not on file  Physical Activity: Not on file  Stress: Not on file  Social Connections: Not on file  Intimate Partner Violence: Not on file   Social History   Tobacco Use  Smoking Status Never  Smokeless  Tobacco Never   Social History   Substance and Sexual Activity  Alcohol Use No   Alcohol/week: 0.0 standard drinks    Family History:  Family History  Problem Relation Age of Onset   Heart attack Father    Arthritis Father    Heart disease Father        Massive MI   Breast cancer Maternal Aunt 52   Breast cancer Sister 40   Cancer Sister  Breast cancer Maternal Aunt 44   Arthritis Mother    Hypertension Mother    Asthma Sister    Alcohol abuse Brother    Hypertension Brother    Cancer Sister        1/2 - breast cancer   Breast cancer Paternal Aunt 26    Past medical history, surgical history, medications, allergies, family history and social history reviewed with patient today and changes made to appropriate areas of the chart.   Review of Systems  Constitutional: Negative.  Negative for fever, malaise/fatigue and weight loss.  HENT: Negative.    Eyes: Negative.   Respiratory: Negative.  Negative for cough and shortness of breath.   Cardiovascular: Negative.  Negative for chest pain.  Gastrointestinal: Negative.  Negative for abdominal pain, blood in stool, constipation and diarrhea.  Genitourinary: Negative.   Musculoskeletal: Negative.   Skin:  Positive for itching and rash.  Neurological: Negative.  Negative for dizziness, weakness and headaches.  Psychiatric/Behavioral: Negative.  Negative for depression.       Objective:    BP 122/80   Pulse 81   Ht 5\' 8"  (1.727 m)   Wt 187 lb 3.2 oz (84.9 kg)   LMP 06/19/2016   SpO2 97%   BMI 28.46 kg/m   Wt Readings from Last 3 Encounters:  08/01/21 187 lb 3.2 oz (84.9 kg)  11/20/20 187 lb (84.8 kg)  10/09/20 189 lb (85.7 kg)    Physical Exam Vitals and nursing note reviewed. Exam conducted with a chaperone present [employer] Goins, CMA).  Constitutional:      General: She is not in acute distress.    Appearance: Normal appearance. She is not ill-appearing or toxic-appearing.  HENT:     Head: Normocephalic and  atraumatic.     Right Ear: Tympanic membrane, ear canal and external ear normal.     Left Ear: Tympanic membrane, ear canal and external ear normal.     Nose: Nose normal. No congestion or rhinorrhea.     Mouth/Throat:     Mouth: Mucous membranes are moist.     Pharynx: Oropharynx is clear. No oropharyngeal exudate.  Eyes:     General: No scleral icterus.       Right eye: No discharge.        Left eye: No discharge.     Extraocular Movements: Extraocular movements intact.     Pupils: Pupils are equal, round, and reactive to light.  Neck:     Vascular: No carotid bruit.  Cardiovascular:     Rate and Rhythm: Normal rate and regular rhythm.     Pulses: Normal pulses.     Heart sounds: Normal heart sounds. No murmur heard. Pulmonary:     Effort: Pulmonary effort is normal. No respiratory distress.     Breath sounds: Normal breath sounds. No wheezing or rhonchi.  Chest:  Breasts:    Right: No mass.     Left: Normal. No inverted nipple, mass, nipple discharge or skin change.     Comments: Right breast surgical scar with surgical changes Abdominal:     General: Abdomen is flat. Bowel sounds are normal. There is no distension.     Palpations: Abdomen is soft.     Tenderness: There is no abdominal tenderness.  Genitourinary:    Exam position: Lithotomy position.     Pubic Area: No rash.      Labia:        Right: No rash, tenderness or lesion.  Left: No rash, tenderness or lesion.      Vagina: Normal. No tenderness.     Cervix: No cervical motion tenderness or erythema.     Uterus: Normal.      Comments: Small amount spotting with pap sample Musculoskeletal:        General: Normal range of motion.     Cervical back: Normal range of motion and neck supple. No rigidity or tenderness.     Right lower leg: No edema.     Left lower leg: No edema.  Lymphadenopathy:     Upper Body:     Right upper body: No supraclavicular, axillary or pectoral adenopathy.     Left upper body: No  supraclavicular, axillary or pectoral adenopathy.     Lower Body: No right inguinal adenopathy. No left inguinal adenopathy.  Skin:    General: Skin is warm and dry.     Capillary Refill: Capillary refill takes less than 2 seconds.     Coloration: Skin is not jaundiced or pale.     Findings: No erythema.          Comments: Three flesh-colored patches appreciated to intergluteal folds, skin flaky.  No redness, tenderness to palpation, fluctuance.  Neurological:     General: No focal deficit present.     Mental Status: She is alert and oriented to person, place, and time.     Motor: No weakness.     Gait: Gait normal.  Psychiatric:        Mood and Affect: Mood normal.        Behavior: Behavior normal.        Thought Content: Thought content normal.        Judgment: Judgment normal.    Results for orders placed or performed in visit on 07/16/21  CBC with Differential/Platelet  Result Value Ref Range   WBC 7.5 3.8 - 10.8 Thousand/uL   RBC 4.38 3.80 - 5.10 Million/uL   Hemoglobin 12.4 11.7 - 15.5 g/dL   HCT 37.6 35.0 - 45.0 %   MCV 85.8 80.0 - 100.0 fL   MCH 28.3 27.0 - 33.0 pg   MCHC 33.0 32.0 - 36.0 g/dL   RDW 13.7 11.0 - 15.0 %   Platelets 266 140 - 400 Thousand/uL   MPV 10.5 7.5 - 12.5 fL   Neutro Abs 4,943 1,500 - 7,800 cells/uL   Lymphs Abs 1,935 850 - 3,900 cells/uL   Absolute Monocytes 443 200 - 950 cells/uL   Eosinophils Absolute 143 15 - 500 cells/uL   Basophils Absolute 38 0 - 200 cells/uL   Neutrophils Relative % 65.9 %   Total Lymphocyte 25.8 %   Monocytes Relative 5.9 %   Eosinophils Relative 1.9 %   Basophils Relative 0.5 %  Comprehensive metabolic panel  Result Value Ref Range   Glucose, Bld 82 65 - 99 mg/dL   BUN 18 7 - 25 mg/dL   Creat 0.98 0.50 - 0.99 mg/dL   BUN/Creatinine Ratio NOT APPLICABLE 6 - 22 (calc)   Sodium 142 135 - 146 mmol/L   Potassium 4.3 3.5 - 5.3 mmol/L   Chloride 104 98 - 110 mmol/L   CO2 30 20 - 32 mmol/L   Calcium 9.9 8.6 - 10.2  mg/dL   Total Protein 7.0 6.1 - 8.1 g/dL   Albumin 4.1 3.6 - 5.1 g/dL   Globulin 2.9 1.9 - 3.7 g/dL (calc)   AG Ratio 1.4 1.0 - 2.5 (calc)   Total Bilirubin 0.3 0.2 -  1.2 mg/dL   Alkaline phosphatase (APISO) 90 31 - 125 U/L   AST 14 10 - 35 U/L   ALT 12 6 - 29 U/L  Lipid panel  Result Value Ref Range   Cholesterol 180 <200 mg/dL   HDL 54 > OR = 50 mg/dL   Triglycerides 65 <150 mg/dL   LDL Cholesterol (Calc) 111 (H) mg/dL (calc)   Total CHOL/HDL Ratio 3.3 <5.0 (calc)   Non-HDL Cholesterol (Calc) 126 <130 mg/dL (calc)  VITAMIN D 25 Hydroxy (Vit-D Deficiency, Fractures)  Result Value Ref Range   Vit D, 25-Hydroxy 97 30 - 100 ng/mL      Assessment & Plan:   Problem List Items Addressed This Visit       Musculoskeletal and Integument   Osteopenia    Chronic.  Patient maintains on vitamin D and calcium.  Recent vitamin D level normal.  We will plan to recheck DEXA scan in 2 years-she is currently following with oncology who is overseeing this.         Other   Vitamin D deficiency    Recent vitamin D level normal.  Continue over-the-counter replacement.      Hyperlipidemia    Chronic.  We discussed LDL goal less than 100 and The 10-year ASCVD risk score (Arnett DK, et al., 2019) is: 1.3%   Values used to calculate the score:     Age: 59 years     Sex: Female     Is Non-Hispanic African American: Yes     Diabetic: No     Tobacco smoker: No     Systolic Blood Pressure: 657 mmHg     Is BP treated: No     HDL Cholesterol: 54 mg/dL     Total Cholesterol: 180 mg/dL  After discussion of risk versus benefit of increasing simvastatin to 20 mg daily, patient elected to proceed with increase in medication.  Medication sent to pharmacy.  Follow-up 6 months to recheck LDL level.      Relevant Medications   simvastatin (ZOCOR) 20 MG tablet   History of breast cancer    Follows with oncology-continue collaboration.      Other Visit Diagnoses     Annual physical exam    -   Primary   Rash       Acute.  Suspect dry skin dermatitis. KOH sample today negative.  Start Vaseline daily.  Can try over-the-counter hydrocortisone with Vaseline if no better.   Screening for diabetes mellitus       Screening for metabolic disorder       Screening for iron deficiency anemia       Screening for colon cancer       Relevant Orders   Ambulatory referral to Gastroenterology   Screening for cervical cancer       Relevant Orders   PAP,TP IMGw/HPV RNA,rflx [id number],84/13        Follow up plan: Return if symptoms worsen or fail to improve.   LABORATORY TESTING:  - Pap smear: pap done  IMMUNIZATIONS:   - Tdap: Tetanus vaccination status reviewed: last tetanus booster within 10 years. - Influenza: Up to date - Pneumovax: Not applicable - Prevnar: Not applicable - HPV: Not applicable - Zostavax vaccine: Not applicable - KGMWN-02 vaccine:  has had 2 doses and booster within past 2 months  SCREENING: -Mammogram: Up to date  - Colonoscopy: Ordered today  - Bone Density: Up to date -Hearing Test: Not applicable  -Spirometry: Not applicable  PATIENT COUNSELING:    Advised to avoid cigarette smoking.  I discussed with the patient that most people either abstain from alcohol or drink within safe limits (<=14/week and <=4 drinks/occasion for males, <=7/weeks and <= 3 drinks/occasion for females) and that the risk for alcohol disorders and other health effects rises proportionally with the number of drinks per week and how often a drinker exceeds daily limits.  Discussed cessation/primary prevention of drug use and availability of treatment for abuse.   Diet: Encouraged to adjust caloric intake to maintain  or achieve ideal body weight, to reduce intake of dietary saturated fat and total fat, to limit sodium intake by avoiding high sodium foods and not adding table salt, and to maintain adequate dietary potassium and calcium preferably from fresh fruits, vegetables, and  low-fat dairy products.    stressed the importance of regular exercise  Injury prevention: Discussed safety belts, safety helmets, smoke detector, smoking near bedding or upholstery.   Dental health: Discussed importance of regular tooth brushing, flossing, and dental visits.    NEXT PREVENTATIVE PHYSICAL DUE IN 1 YEAR. Return if symptoms worsen or fail to improve.

## 2021-08-01 NOTE — Assessment & Plan Note (Signed)
Recent vitamin D level normal.  Continue over-the-counter replacement.

## 2021-08-01 NOTE — Assessment & Plan Note (Signed)
Chronic.  We discussed LDL goal less than 100 and The 10-year ASCVD risk score (Arnett DK, et al., 2019) is: 1.3%   Values used to calculate the score:     Age: 50 years     Sex: Female     Is Non-Hispanic African American: Yes     Diabetic: No     Tobacco smoker: No     Systolic Blood Pressure: 244 mmHg     Is BP treated: No     HDL Cholesterol: 54 mg/dL     Total Cholesterol: 180 mg/dL  After discussion of risk versus benefit of increasing simvastatin to 20 mg daily, patient elected to proceed with increase in medication.  Medication sent to pharmacy.  Follow-up 6 months to recheck LDL level.

## 2021-08-01 NOTE — Assessment & Plan Note (Signed)
Follows with oncology-continue collaboration.

## 2021-08-02 ENCOUNTER — Encounter: Payer: Self-pay | Admitting: Internal Medicine

## 2021-08-03 ENCOUNTER — Other Ambulatory Visit (HOSPITAL_COMMUNITY): Payer: Self-pay

## 2021-08-03 DIAGNOSIS — Z170 Estrogen receptor positive status [ER+]: Secondary | ICD-10-CM

## 2021-08-03 LAB — PAP, TP IMAGING W/ HPV RNA, RFLX HPV TYPE 16,18/45: HPV DNA High Risk: NOT DETECTED

## 2021-08-06 ENCOUNTER — Inpatient Hospital Stay (HOSPITAL_COMMUNITY): Payer: 59 | Attending: Hematology

## 2021-08-06 ENCOUNTER — Telehealth: Payer: Self-pay

## 2021-08-06 DIAGNOSIS — Z8639 Personal history of other endocrine, nutritional and metabolic disease: Secondary | ICD-10-CM | POA: Diagnosis not present

## 2021-08-06 DIAGNOSIS — Z9221 Personal history of antineoplastic chemotherapy: Secondary | ICD-10-CM | POA: Insufficient documentation

## 2021-08-06 DIAGNOSIS — Z79811 Long term (current) use of aromatase inhibitors: Secondary | ICD-10-CM | POA: Insufficient documentation

## 2021-08-06 DIAGNOSIS — Z170 Estrogen receptor positive status [ER+]: Secondary | ICD-10-CM | POA: Diagnosis not present

## 2021-08-06 DIAGNOSIS — Z9011 Acquired absence of right breast and nipple: Secondary | ICD-10-CM | POA: Diagnosis not present

## 2021-08-06 DIAGNOSIS — Z923 Personal history of irradiation: Secondary | ICD-10-CM | POA: Diagnosis not present

## 2021-08-06 DIAGNOSIS — D0511 Intraductal carcinoma in situ of right breast: Secondary | ICD-10-CM | POA: Insufficient documentation

## 2021-08-06 DIAGNOSIS — C50311 Malignant neoplasm of lower-inner quadrant of right female breast: Secondary | ICD-10-CM

## 2021-08-06 LAB — COMPREHENSIVE METABOLIC PANEL
ALT: 16 U/L (ref 0–44)
AST: 20 U/L (ref 15–41)
Albumin: 3.9 g/dL (ref 3.5–5.0)
Alkaline Phosphatase: 92 U/L (ref 38–126)
Anion gap: 4 — ABNORMAL LOW (ref 5–15)
BUN: 19 mg/dL (ref 6–20)
CO2: 28 mmol/L (ref 22–32)
Calcium: 8.9 mg/dL (ref 8.9–10.3)
Chloride: 105 mmol/L (ref 98–111)
Creatinine, Ser: 0.95 mg/dL (ref 0.44–1.00)
GFR, Estimated: >60 mL/min (ref >60)
Glucose, Bld: 92 mg/dL (ref 70–99)
Potassium: 3.8 mmol/L (ref 3.5–5.1)
Sodium: 137 mmol/L (ref 135–145)
Total Bilirubin: 0.9 mg/dL (ref 0.3–1.2)
Total Protein: 7.5 g/dL (ref 6.5–8.1)

## 2021-08-06 LAB — CBC WITH DIFFERENTIAL/PLATELET
Abs Immature Granulocytes: 0.02 10*3/uL (ref 0.00–0.07)
Basophils Absolute: 0.1 10*3/uL (ref 0.0–0.1)
Basophils Relative: 1 %
Eosinophils Absolute: 0.1 10*3/uL (ref 0.0–0.5)
Eosinophils Relative: 1 %
HCT: 35.1 % — ABNORMAL LOW (ref 36.0–46.0)
Hemoglobin: 11.9 g/dL — ABNORMAL LOW (ref 12.0–15.0)
Immature Granulocytes: 0 %
Lymphocytes Relative: 25 %
Lymphs Abs: 1.9 10*3/uL (ref 0.7–4.0)
MCH: 29 pg (ref 26.0–34.0)
MCHC: 33.9 g/dL (ref 30.0–36.0)
MCV: 85.6 fL (ref 80.0–100.0)
Monocytes Absolute: 0.5 10*3/uL (ref 0.1–1.0)
Monocytes Relative: 6 %
Neutro Abs: 5.2 10*3/uL (ref 1.7–7.7)
Neutrophils Relative %: 67 %
Platelets: 245 10*3/uL (ref 150–400)
RBC: 4.1 MIL/uL (ref 3.87–5.11)
RDW: 15.3 % (ref 11.5–15.5)
WBC: 7.8 10*3/uL (ref 4.0–10.5)
nRBC: 0 % (ref 0.0–0.2)

## 2021-08-06 LAB — VITAMIN D 25 HYDROXY (VIT D DEFICIENCY, FRACTURES): Vit D, 25-Hydroxy: 101.67 ng/mL — ABNORMAL HIGH (ref 30–100)

## 2021-08-06 NOTE — Telephone Encounter (Signed)
Patient aware of sample issue and follow up appointment made.

## 2021-08-06 NOTE — Telephone Encounter (Signed)
-----   Message from Eulogio Bear, NP sent at 08/06/2021  3:56 AM EST ----- Please notify patient that pap smear sample was inadequate.  We need to repeat the pap sample. Please have her schedule an appointment.

## 2021-08-09 ENCOUNTER — Ambulatory Visit: Payer: 59 | Admitting: Nurse Practitioner

## 2021-08-10 ENCOUNTER — Ambulatory Visit (INDEPENDENT_AMBULATORY_CARE_PROVIDER_SITE_OTHER): Payer: 59 | Admitting: Nurse Practitioner

## 2021-08-10 ENCOUNTER — Encounter: Payer: Self-pay | Admitting: Nurse Practitioner

## 2021-08-10 ENCOUNTER — Other Ambulatory Visit: Payer: Self-pay

## 2021-08-10 VITALS — BP 108/78 | HR 80 | Ht 68.0 in | Wt 187.0 lb

## 2021-08-10 DIAGNOSIS — Z124 Encounter for screening for malignant neoplasm of cervix: Secondary | ICD-10-CM

## 2021-08-10 NOTE — Progress Notes (Signed)
Subjective:    Patient ID: Lydia Stevens, female    DOB: Mar 24, 1971, 50 y.o.   MRN: [id number]  HPI: Lydia Stevens is a 50 y.o. female presenting for repeat pap smear.  Chief Complaint  Patient presents with   Gynecologic Exam    Repeat pap   Patient is here for repeat pap.  Recent pap smear had unsatisfactory specimen adequacy.    No new concerns or complaints today.  Including no vaginal concerns.  Allergies  Allergen Reactions   Oxycodone Nausea Only    Outpatient Encounter Medications as of 08/10/2021  Medication Sig   anastrozole (ARIMIDEX) 1 MG tablet TAKE 1 TABLET(1 MG) BY MOUTH DAILY   calcium carbonate (CALCIUM 600) 600 MG TABS tablet Take 1 tablet (600 mg total) by mouth 2 (two) times daily with a meal.   cholecalciferol (VITAMIN D3) 25 MCG (1000 UNIT) tablet Take 1 tablet (1,000 Units total) by mouth daily.   simvastatin (ZOCOR) 20 MG tablet Take 1 tablet (20 mg total) by mouth daily at 6 PM.   No facility-administered encounter medications on file as of 08/10/2021.    Patient Active Problem List   Diagnosis Date Noted   Hyperlipidemia 05/18/2018   Overweight (BMI 25.0-29.9) 05/02/2017   Osteopenia 08/18/2015   Vitamin D deficiency 08/05/2015   Scoliosis of lumbar spine 04/18/2015   Situational depression 01/17/2015   History of breast cancer 12/29/2014   Acquired absence of breast and nipple 12/06/2014   Genetic testing 07/15/2014   Breast cancer of lower-inner quadrant of right female breast ([place]) 06/09/2014    Past Medical History:  Diagnosis Date   Anemia    before    Blood transfusion without reported diagnosis    Breast cancer ([place])    2015   Cancer ([place])    Phreesia 07/02/2020   GERD (gastroesophageal reflux disease)    History of breast cancer 12/29/2014   Hives Mar 3rd 2016   Hyperlipidemia    Osteoporosis 08/18/2015   Scoliosis     Relevant past medical, surgical, family and social history reviewed and updated as  indicated. Interim medical history since our last visit reviewed.  Review of Systems Per HPI unless specifically indicated above     Objective:    BP 108/78    Pulse 80    Ht 5\' 8"  (1.727 m)    Wt 187 lb (84.8 kg)    SpO2 99%    BMI 28.43 kg/m   Wt Readings from Last 3 Encounters:  08/10/21 187 lb (84.8 kg)  08/01/21 187 lb 3.2 oz (84.9 kg)  11/20/20 187 lb (84.8 kg)    Physical Exam Vitals and nursing note reviewed. Exam conducted with a chaperone present Elizabeth Palau, CMA).  Constitutional:      General: She is not in acute distress.    Appearance: She is not toxic-appearing.  Genitourinary:    General: Normal vulva.     Exam position: Lithotomy position.     Pubic Area: No rash.      Labia:        Right: No rash, tenderness or lesion.        Left: No rash, tenderness or lesion.      Vagina: No vaginal discharge.     Cervix: Normal.     Comments: Mild spotting with pap sample Lymphadenopathy:     Lower Body: No right inguinal adenopathy. No left inguinal adenopathy.  Neurological:     Mental Status: She is alert  and oriented to person, place, and time.      Assessment & Plan:   Problem List Items Addressed This Visit   None Visit Diagnoses     Screening for cervical cancer    -  Primary   Relevant Orders   PAP,TP IMGw/HPV RNA,rflx [id number],53/20        Follow up plan: Return for as scheduled.

## 2021-08-12 NOTE — Progress Notes (Signed)
[facility] [address] [place] [address]   Patient Care Team: Eulogio Bear, NP as PCP - General (Nurse Practitioner) Alphonsa Overall, MD as Consulting Physician (General Surgery) Nicholas Lose, MD as Consulting Physician (Hematology and Oncology) Gery Pray, MD as Consulting Physician (Radiation Oncology) Alda Berthold, DO as Consulting Physician (Neurology)  SUMMARY OF ONCOLOGIC HISTORY: Oncology History  Breast cancer of lower-inner quadrant of right female breast ([place])  06/07/2014 Initial Biopsy   Right breast needle biopsy 5:00 position: Invasive ductal carcinoma with DCIS, ER 100%, PR 71%, Ki-67 33%, HER-2 negative ratio 1.03   06/17/2014 Breast MRI   Right breast: 10 x 7 x 5 mm biopsy-proven IDC with DCIS, left breast 7 x 7 x 7 mm fibroadenoma, left upper quadrant of the abdomen abutting the peritoneum 3 x 1.1 cm oval soft tissue mass   07/27/2014 Surgery   Right breast mastectomy: Invasive ductal carcinoma grade 3, 2 cm, intermediate grade DCIS, lymphovascular invasion identified, 2 SLN negative, T1 C. N0 M0 stage IA, ER positive, PR 7%, HER-2 negative, Ki-67 33%   07/27/2014 Oncotype testing   Recurrence Score of 24, placing patient in the intermediate risk group   09/06/2014 -  Chemotherapy   Taxotere/Cytoxan    09/08/2014 Adverse Reaction   Nasuea and voming three times x 2 days.  Added Aloxi and Emend to anti-emetic regimen.   10/18/2014 -  Chemotherapy   Zoladex   10/27/2014 Adverse Reaction   Hives, secondary to Zoladex?   02/16/2015 Surgery   Laparoscopic BSO with Dr. Alycia Rossetti     CHIEF COMPLIANT: Follow-up for right breast cancer ER+/PR+/HER 2-   INTERVAL HISTORY: Ms. Lydia Stevens is a 50 y.o. female here today for follow up of her right breast cancer. Her last visit was on 08/17/2020.   Today she reports feeling good. She is taking anastrozole and tolerating it well. She reports mild hot flashes. She has been taking  vitamin D3 and calcium + vitamin D daily. She denies tingling/numbness.   REVIEW OF SYSTEMS:   Review of Systems  Constitutional:  Negative for appetite change and fatigue.  Endocrine: Positive for hot flashes (mild).  Neurological:  Negative for numbness.  All other systems reviewed and are negative.  I have reviewed the past medical history, past surgical history, social history and family history with the patient and they are unchanged from previous note.   ALLERGIES:   is allergic to oxycodone.   MEDICATIONS:  Current Outpatient Medications  Medication Sig Dispense Refill   anastrozole (ARIMIDEX) 1 MG tablet TAKE 1 TABLET(1 MG) BY MOUTH DAILY 30 tablet 5   calcium carbonate (CALCIUM 600) 600 MG TABS tablet Take 1 tablet (600 mg total) by mouth 2 (two) times daily with a meal. 30 tablet    cholecalciferol (VITAMIN D3) 25 MCG (1000 UNIT) tablet Take 1 tablet (1,000 Units total) by mouth daily.     simvastatin (ZOCOR) 20 MG tablet Take 1 tablet (20 mg total) by mouth daily at 6 PM. 90 tablet 3   No current facility-administered medications for this visit.     PHYSICAL EXAMINATION: Performance status (ECOG): 1 - Symptomatic but completely ambulatory  Vitals:   08/13/21 0807  BP: 116/62  Pulse: 73  Resp: 16  Temp: 98.6 F (37 C)  SpO2: 100%   Wt Readings from Last 3 Encounters:  08/13/21 188 lb 3.2 oz (85.4 kg)  08/10/21 187 lb (84.8 kg)  08/01/21 187 lb 3.2  oz (84.9 kg)   Physical Exam Vitals reviewed.  Constitutional:      Appearance: Normal appearance.  Cardiovascular:     Rate and Rhythm: Normal rate and regular rhythm.     Pulses: Normal pulses.     Heart sounds: Normal heart sounds.  Pulmonary:     Effort: Pulmonary effort is normal.     Breath sounds: Normal breath sounds.  Chest:  Breasts:    Right: No swelling, bleeding, mass, skin change (reconsruction within normal limits) or tenderness.     Left: Normal. No swelling, bleeding, mass, skin change or  tenderness.  Lymphadenopathy:     Upper Body:     Right upper body: No supraclavicular, axillary or pectoral adenopathy.     Left upper body: No supraclavicular, axillary or pectoral adenopathy.  Neurological:     General: No focal deficit present.     Mental Status: She is alert and oriented to person, place, and time.  Psychiatric:        Mood and Affect: Mood normal.        Behavior: Behavior normal.    Breast Exam Chaperone: Thana Ates     LABORATORY DATA:  I have reviewed the data as listed CMP Latest Ref Rng & Units 08/06/2021 07/16/2021 08/14/2020  Glucose 70 - 99 mg/dL 92 82 100(H)  BUN 6 - 20 mg/dL _0 Creatinine 0.44 - 1.00 mg/dL 0.95 0.98 0.94  Sodium 135 - 145 mmol/L 137 142 140  Potassium 3.5 - 5.1 mmol/L 3.8 4.3 3.8  Chloride 98 - 111 mmol/L 105 104 105  CO2 22 - 32 mmol/L _1 Calcium 8.9 - 10.3 mg/dL 8.9 9.9 9.5  Total Protein 6.5 - 8.1 g/dL 7.5 7.0 7.4  Total Bilirubin 0.3 - 1.2 mg/dL 0.9 0.3 0.6  Alkaline Phos 38 - 126 U/L 92 - 98  AST 15 - 41 U/L 20 14 14(L)  ALT 0 - 44 U/L _2 No results found for: GLO756 Lab Results  Component Value Date   WBC 7.8 08/06/2021   HGB 11.9 (L) 08/06/2021   HCT 35.1 (L) 08/06/2021   MCV 85.6 08/06/2021   PLT 245 08/06/2021   NEUTROABS 5.2 08/06/2021    ASSESSMENT:  1.  Stage I (PT1CPN0) right breast IDC: -Status post right mastectomy followed by TRAM flap in December 2015, 2 cm IDC, grade 3, 2 sentinel lymph node negative, ER/PR positive, HER-2 negative, Ki-67 33%. -Oncotype DX recurrence score of 24. -4 cycles of TC from 09/06/2014 through 11/08/2014. - Anastrozole was started around April 2016.  Anastrozole being continued beyond 5 years.    2.  Bone health: - DEXA scan on 07/29/2017 with T score -0.8. - She was started on Prolia every 6 months on 08/18/2015.  Last dose was on 02/17/2019.  Prolia was discontinued secondary to high co-pays.    PLAN:  1.  Stage I (PT1CPN0) right breast IDC: -  Physical examination today did not reveal any suspicious masses in the reconstructed right breast and left breast.  No palpable adenopathy. - She reports occasional hot flashes with anastrozole.  She will continue anastrozole daily. - Reviewed labs from 08/06/2021 which showed normal LFTs and CBC. - Left breast mammogram on 10/11/2020 reviewed by me was BI-RADS Category 1. - RTC 1 year for follow-up with repeat mammogram and labs.    2.  Bone health: -Vitamin D level was 101.67. - She is taking calcium plus D and vitamin  D separately. - She was told to cut back on the dose of vitamin D.   Breast Cancer therapy associated bone loss: I have recommended calcium, Vitamin D and weight bearing exercises.  Orders placed this encounter:  No orders of the defined types were placed in this encounter.   The patient has a good understanding of the overall plan. She agrees with it. She will call with any problems that may develop before the next visit here.  Derek Jack, MD [place] [phone]   I, Thana Ates, am acting as a scribe for Dr. Derek Jack.  I, Derek Jack MD, have reviewed the above documentation for accuracy and completeness, and I agree with the above.

## 2021-08-13 ENCOUNTER — Encounter (HOSPITAL_COMMUNITY): Payer: Self-pay

## 2021-08-13 ENCOUNTER — Inpatient Hospital Stay (HOSPITAL_BASED_OUTPATIENT_CLINIC_OR_DEPARTMENT_OTHER): Payer: 59 | Admitting: Hematology

## 2021-08-13 ENCOUNTER — Other Ambulatory Visit: Payer: Self-pay

## 2021-08-13 VITALS — BP 116/62 | HR 73 | Temp 98.6°F | Resp 16 | Ht 67.5 in | Wt 188.2 lb

## 2021-08-13 DIAGNOSIS — Z170 Estrogen receptor positive status [ER+]: Secondary | ICD-10-CM

## 2021-08-13 DIAGNOSIS — D0511 Intraductal carcinoma in situ of right breast: Secondary | ICD-10-CM | POA: Diagnosis not present

## 2021-08-13 DIAGNOSIS — C50311 Malignant neoplasm of lower-inner quadrant of right female breast: Secondary | ICD-10-CM

## 2021-08-13 NOTE — Patient Instructions (Addendum)
[place] at [facility] Discharge Instructions   You were seen and examined today by Dr. Delton Coombes. He reviewed your lab work - all was normal, except Vitamin D. You can discontinue Vitamin D 3 tablets and just take calcium + D daily. Continue anastrozole as prescribed for a total of 10 years.  Return as scheduled for lab work and office visit.    Thank you for choosing [place] at [facility] to provide your oncology and hematology care.  To afford each patient quality time with our provider, please arrive at least 15 minutes before your scheduled appointment time.   If you have a lab appointment with the [facility] please come in thru the Main Entrance and check in at the main information desk.  You need to re-schedule your appointment should you arrive 10 or more minutes late.  We strive to give you quality time with our providers, and arriving late affects you and other patients whose appointments are after yours.  Also, if you no show three or more times for appointments you may be dismissed from the clinic at the providers discretion.     Again, thank you for choosing [facility].  Our hope is that these requests will decrease the amount of time that you wait before being seen by our physicians.       _____________________________________________________________  Should you have questions after your visit to [facility], please contact our office at [phone] and follow the prompts.  Our office hours are 8:00 a.m. and 4:30 p.m. Monday - Friday.  Please note that voicemails left after 4:00 p.m. may not be returned until the following business day.  We are closed weekends and major holidays.  You do have access to a nurse 24-7, just call the main number to the clinic [phone] and do not press any options, hold on the line and a nurse will answer the phone.    For prescription refill requests, have your  pharmacy contact our office and allow 72 hours.    Due to Covid, you will need to wear a mask upon entering the hospital. If you do not have a mask, a mask will be given to you at the Main Entrance upon arrival. For doctor visits, patients may have 1 support person age 54 or older with them. For treatment visits, patients can not have anyone with them due to social distancing guidelines and our immunocompromised population.

## 2021-08-14 ENCOUNTER — Telehealth: Payer: Self-pay

## 2021-08-14 LAB — PAP, TP IMAGING W/ HPV RNA, RFLX HPV TYPE 16,18/45: HPV DNA High Risk: NOT DETECTED

## 2021-08-14 NOTE — Telephone Encounter (Signed)
Pt called stating that she received a bill for [employer] that may have been coded wrong.  DOS: Jul 27, 2021 AMOUNT:$5.34 INSUR: [place] CONTACT: [phone]

## 2021-08-24 NOTE — Telephone Encounter (Signed)
I have sent email to [name] at [facility] I am waiting on her response.

## 2021-08-24 NOTE — Telephone Encounter (Signed)
Per Vicki's email  I have sent to billing to see how insurance processed.

## 2021-09-05 ENCOUNTER — Other Ambulatory Visit (HOSPITAL_COMMUNITY): Payer: Self-pay | Admitting: Hematology

## 2021-09-20 ENCOUNTER — Ambulatory Visit: Payer: 59

## 2021-10-25 ENCOUNTER — Other Ambulatory Visit: Payer: Self-pay

## 2021-10-25 ENCOUNTER — Telehealth (HOSPITAL_COMMUNITY): Payer: Self-pay | Admitting: Hematology

## 2021-10-25 ENCOUNTER — Ambulatory Visit (HOSPITAL_COMMUNITY)
Admission: RE | Admit: 2021-10-25 | Discharge: 2021-10-25 | Disposition: A | Discharge status: Home / Self Care | Payer: 59 | Source: Ambulatory Visit | Attending: Hematology | Admitting: Hematology

## 2021-10-25 DIAGNOSIS — Z1231 Encounter for screening mammogram for malignant neoplasm of breast: Secondary | ICD-10-CM | POA: Diagnosis not present

## 2021-10-25 DIAGNOSIS — C50311 Malignant neoplasm of lower-inner quadrant of right female breast: Secondary | ICD-10-CM | POA: Diagnosis present

## 2021-10-25 DIAGNOSIS — Z170 Estrogen receptor positive status [ER+]: Secondary | ICD-10-CM | POA: Insufficient documentation

## 2021-10-25 NOTE — Telephone Encounter (Signed)
Pt came in today with concerns over a lab charge that was ordered by Dr Raliegh Ip . A Vit D level was taken however a Dx code of VIT D deficiency was not listed on the claim. Pt will owe $140 if not corrected. Advised her that I would contact billing and or coder to see what could be done. ? ?DOS 08/06/22 ?

## 2021-10-25 NOTE — Telephone Encounter (Signed)
Per April K DX Z86.39 was added on 10/23/21 by Lucila [place] and she refiled the claim today. ? ?Pt is aware ?

## 2022-01-30 ENCOUNTER — Ambulatory Visit: Payer: 59 | Admitting: Nurse Practitioner

## 2022-03-01 ENCOUNTER — Encounter: Payer: Self-pay | Admitting: Nurse Practitioner

## 2022-03-01 ENCOUNTER — Ambulatory Visit (INDEPENDENT_AMBULATORY_CARE_PROVIDER_SITE_OTHER): Payer: 59 | Admitting: Nurse Practitioner

## 2022-03-01 VITALS — BP 136/82 | HR 72 | Ht 68.0 in | Wt 189.0 lb

## 2022-03-01 DIAGNOSIS — Z23 Encounter for immunization: Secondary | ICD-10-CM

## 2022-03-01 DIAGNOSIS — E782 Mixed hyperlipidemia: Secondary | ICD-10-CM | POA: Diagnosis not present

## 2022-03-01 DIAGNOSIS — E663 Overweight: Secondary | ICD-10-CM

## 2022-03-01 DIAGNOSIS — L309 Dermatitis, unspecified: Secondary | ICD-10-CM | POA: Diagnosis not present

## 2022-03-01 DIAGNOSIS — Z1211 Encounter for screening for malignant neoplasm of colon: Secondary | ICD-10-CM

## 2022-03-01 DIAGNOSIS — Z853 Personal history of malignant neoplasm of breast: Secondary | ICD-10-CM

## 2022-03-01 MED ORDER — TRIAMCINOLONE ACETONIDE 0.1 % EX CREA: 1.0000 | TOPICAL_CREAM | Freq: Two times a day (BID) | CUTANEOUS | 0 refills | Status: DC

## 2022-03-01 NOTE — Assessment & Plan Note (Signed)
Patient educated on CDC recommendation for theshingles vaccine. Verbal consent was obtained from the patient, vaccine administered by nurse, no sign of adverse reactions noted at this time. Patient education on arm soreness and use of tylenol  for this patient  was discussed. Patient educated on the signs and symptoms of adverse effect and advise to contact the office if they occur.  

## 2022-03-01 NOTE — Assessment & Plan Note (Signed)
  Currently taking anastrozole 1 mg daily and calcium supplements daily  Has had chemotherapy and mastectomy in the past Patient encouraged to maintain close follow-up with oncology

## 2022-03-01 NOTE — Assessment & Plan Note (Signed)
Currently on simvastatin 20 mg daily LDL goal is less than 100 continue current medication. Lipid panel ordered

## 2022-03-01 NOTE — Assessment & Plan Note (Deleted)
Chronic stable condition Currently taking anastrozole 1 mg daily Has had chemotherapy and mastectomy in the past Patient encouraged to maintain close follow-up with oncology Taking calcium  supplement

## 2022-03-01 NOTE — Progress Notes (Signed)
New Patient Office Visit  Subjective    Patient ID: Lydia Stevens, female    DOB: 11/12/1970  Age: 51 y.o. MRN: [id number]  CC:  Chief Complaint  Patient presents with   New Patient (Initial Visit)    np   Rash    Between buttocks for about 6 months     HPI Lydia Stevens with past medical history of osteopenia, breast cancer, hyperlipidemia, vitamin D deficiency presents to establish care for her chronic medical conditions.  Previous patient of Lydia Stevens.    Patient complains of rashes on her left elbow, left buttock cheek, , between her buttocks that she first noticed about 8 months ago , patient stated that her rashes are itchy she has been using OTC hydrocortisone cream without improvement .patient denies fever, chills .  Patient stated that she likes to take hot showers she denies recent changes with her  detergents and lotion  Due for shingles vaccine need for shingles vaccine discussed first dose of shingles vaccine given today.   Due for colon cancer screening referral placed to GI for colon cancer screening  Outpatient Encounter Medications as of 03/01/2022  Medication Sig   anastrozole (ARIMIDEX) 1 MG tablet TAKE 1 TABLET(1 MG) BY MOUTH DAILY   BIOTIN PO Take by mouth. Daily   calcium carbonate (CALCIUM 600) 600 MG TABS tablet Take 1 tablet (600 mg total) by mouth 2 (two) times daily with a meal.   Probiotic Product (DIGESTIVE ADVANTAGE PO) Take by mouth. Once daily   simvastatin (ZOCOR) 20 MG tablet Take 1 tablet (20 mg total) by mouth daily at 6 PM.   triamcinolone cream (KENALOG) 0.1 % Apply 1 Application topically 2 (two) times daily.   cholecalciferol (VITAMIN D3) 25 MCG (1000 UNIT) tablet Take 1 tablet (1,000 Units total) by mouth daily. (Patient not taking: Reported on 03/01/2022)   No facility-administered encounter medications on file as of 03/01/2022.    Past Medical History:  Diagnosis Date   Anemia    before    Blood transfusion without  reported diagnosis    Breast cancer ([place])    2015   Cancer ([place])    Phreesia 07/02/2020   GERD (gastroesophageal reflux disease)    History of breast cancer 12/29/2014   Hives Mar 3rd 2016   Hyperlipidemia    Osteoporosis 08/18/2015   Scoliosis     Past Surgical History:  Procedure Laterality Date   BREAST RECONSTRUCTION Right 12/06/2014   Procedure: RIGHT NIPPLE AREOLAR RECONSTRUCTION WITH FULL THICKNESS SKIN GRAFT FROM RIGHT THIGH;  Surgeon: Irene Limbo, MD;  Location: [place];  Service: Plastics;  Laterality: Right;   BREAST REDUCTION SURGERY Left 12/06/2014   Procedure: LEFT BREAST REDUCTION FOR ASYMMETRY;  Surgeon: Irene Limbo, MD;  Location: [facility];  Service: Plastics;  Laterality: Left;   BREAST SURGERY N/A    Phreesia 07/02/2020   CESAREAN SECTION  1996   CESAREAN SECTION N/A    Phreesia 07/02/2020   LATISSIMUS FLAP TO BREAST Right 07/27/2014   Procedure: TRAM FLAP RECONSTUCTION RIGHT CHEST;  Surgeon: Irene Limbo, MD;  Location: [facility];  Service: Plastics;  Laterality: Right;   MASTECTOMY Right    MASTECTOMY W/ SENTINEL NODE BIOPSY Right 07/27/2014   Procedure: RIGHT MASTECTOMY WITH RIGHT AXILLARY SENTINEL LYMPH NODE BIOPSY;  Surgeon: Alphonsa Overall, MD;  Location: [facility];  Service: General;  Laterality: Right;   OOPHORECTOMY  2016   PORT-A-CATH REMOVAL Left 12/06/2014   Procedure:  REMOVAL PORT-A-CATH;  Surgeon: Irene Limbo, MD;  Location: [facility];  Service: Plastics;  Laterality: Left;   PORTACATH PLACEMENT Left 09/05/2013   REDUCTION MAMMAPLASTY Left    WISDOM TOOTH EXTRACTION      Family History  Problem Relation Age of Onset   Heart attack Father    Arthritis Father    Heart disease Father        Massive MI   Breast cancer Maternal Aunt 67   Breast cancer Sister 45   Cancer Sister    Breast cancer Maternal Aunt 41   Arthritis Mother    Hypertension Mother    Asthma Sister    Alcohol abuse  Brother    Hypertension Brother    Cancer Sister        1/2 - breast cancer   Breast cancer Paternal Aunt 32    Social History   Socioeconomic History   Marital status: Married    Spouse name: Not on file   Number of children: 1   Years of education: Not on file   Highest education level: Not on file  Occupational History   Not on file  Tobacco Use   Smoking status: Never   Smokeless tobacco: Never  Vaping Use   Vaping Use: Never used  Substance and Sexual Activity   Alcohol use: No    Alcohol/week: 0.0 standard drinks of alcohol   Drug use: No   Sexual activity: Yes    Birth control/protection: None  Other Topics Concern   Not on file  Social History Narrative   Right Handed   Lives in a one story home    Drinks Caffeine Occasionally    Social Determinants of Health   Financial Resource Strain: Not on file  Food Insecurity: Not on file  Transportation Needs: Not on file  Physical Activity: Not on file  Stress: Not on file  Social Connections: Not on file  Intimate Partner Violence: Not on file    Review of Systems  Constitutional: Negative.  Negative for chills, fever, malaise/fatigue and weight loss.  Respiratory: Negative.  Negative for cough, hemoptysis, sputum production and shortness of breath.   Cardiovascular: Negative.  Negative for chest pain, palpitations, orthopnea, claudication, leg swelling and PND.  Gastrointestinal: Negative.  Negative for heartburn, nausea and vomiting.  Musculoskeletal: Negative.  Negative for back pain, myalgias and neck pain.  Skin:  Positive for itching and rash.  Neurological: Negative.  Negative for dizziness, tingling, tremors, sensory change and headaches.  Psychiatric/Behavioral:  Negative for depression, hallucinations, memory loss, substance abuse and suicidal ideas. The patient is not nervous/anxious.         Objective    BP 136/82 (BP Location: Left Arm, Patient Position: Sitting, Cuff Size: Normal)   Pulse  72   Ht '5\' 8"'$  (1.727 m)   Wt 189 lb (85.7 kg)   SpO2 94%   BMI 28.74 kg/m   Physical Exam Vitals and nursing note reviewed. Exam conducted with a chaperone present.  Constitutional:      General: She is not in acute distress.    Appearance: She is not ill-appearing, toxic-appearing or diaphoretic.  Cardiovascular:     Rate and Rhythm: Normal rate and regular rhythm.     Pulses: Normal pulses.     Heart sounds: Normal heart sounds. No murmur heard.    No friction rub. No gallop.  Pulmonary:     Effort: Pulmonary effort is normal. No respiratory distress.  Breath sounds: Normal breath sounds. No stridor. No wheezing, rhonchi or rales.  Chest:     Chest wall: No tenderness.  Abdominal:     Palpations: Abdomen is soft.  Skin:    General: Skin is dry.     Coloration: Skin is not jaundiced or pale.     Findings: Rash present. No bruising, erythema or lesion.     Comments:  hyper pigmented patch noted on left elbow, coccyx area, left buttock .  No redness, vesicle, drainage noted  Neurological:     Mental Status: She is alert.  Psychiatric:        Mood and Affect: Mood normal.        Behavior: Behavior normal.        Thought Content: Thought content normal.        Judgment: Judgment normal.         Assessment & Plan:   Problem List Items Addressed This Visit       Musculoskeletal and Integument   Eczema    Rx Kenalog 0.1% cream apply to affected site twice daily Patient encouraged to keep her skin well moisturized and avoid hot showers and Will refer to dermatology if condition does not improve      Relevant Medications   triamcinolone cream (KENALOG) 0.1 %     Other   History of breast cancer     Currently taking anastrozole 1 mg daily and calcium supplements daily  Has had chemotherapy and mastectomy in the past Patient encouraged to maintain close follow-up with oncology       Overweight (BMI 25.0-29.9)    Weight: 189 lb (85.7 kg)  Wt Readings from  Last 3 Encounters:  03/01/22 189 lb (85.7 kg)  08/13/21 188 lb 3.2 oz (85.4 kg)  08/10/21 187 lb (84.8 kg)  Patient counseled on low-carb diet encouraged to engage in regular moderate exercises at least 150 minutes weekly      Hyperlipidemia - Primary    Currently on simvastatin 20 mg daily LDL goal is less than 100 continue current medication. Lipid panel ordered      Relevant Orders   Lipid Profile   Need for shingles vaccine    Patient educated on CDC recommendation for the shingles  vaccine. Verbal consent was obtained from the patient, vaccine administered by nurse, no sign of adverse reactions noted at this time. Patient education on arm soreness and use of tylenol  for this patient  was discussed. Patient educated on the signs and symptoms of adverse effect and advise to contact the office if they occur.      Relevant Orders   Varicella-zoster vaccine IM (Completed)   Other Visit Diagnoses     Screening for colon cancer       Relevant Orders   Ambulatory referral to Gastroenterology       Return in about 6 months (around 09/01/2022) for CPE.   Renee Rival, FNP

## 2022-03-01 NOTE — Patient Instructions (Signed)
Please apply Kenalog cream 0.1% cream twice daily to affected site, please avoid hot showers/bath, keep skin well moisturized.    It is important that you exercise regularly at least 30 minutes 5 times a week.  Think about what you will eat, plan ahead. Choose " clean, green, fresh or frozen" over canned, processed or packaged foods which are more sugary, salty and fatty. 70 to 75% of food eaten should be vegetables and fruit. Three meals at set times with snacks allowed between meals, but they must be fruit or vegetables. Aim to eat over a 12 hour period , example 7 am to 7 pm, and STOP after  your last meal of the day. Drink water,generally about 64 ounces per day, no other drink is as healthy. Fruit juice is best enjoyed in a healthy way, by EATING the fruit.  Thanks for choosing [facility], we consider it a privelige to serve you.

## 2022-03-01 NOTE — Assessment & Plan Note (Addendum)
Weight: 189 lb (85.7 kg)  Wt Readings from Last 3 Encounters:  03/01/22 189 lb (85.7 kg)  08/13/21 188 lb 3.2 oz (85.4 kg)  08/10/21 187 lb (84.8 kg)  Patient counseled on low-carb diet encouraged to engage in regular moderate exercises at least 150 minutes weekly

## 2022-03-01 NOTE — Assessment & Plan Note (Addendum)
Rx Kenalog 0.1% cream apply to affected site twice daily Patient encouraged to keep her skin well moisturized and avoid hot showers and Will refer to dermatology if condition does not improve

## 2022-03-03 ENCOUNTER — Other Ambulatory Visit (HOSPITAL_COMMUNITY): Payer: Self-pay | Admitting: Hematology

## 2022-03-04 ENCOUNTER — Encounter: Payer: Self-pay | Admitting: *Deleted

## 2022-03-04 ENCOUNTER — Other Ambulatory Visit (HOSPITAL_COMMUNITY): Payer: Self-pay | Admitting: *Deleted

## 2022-03-04 NOTE — Telephone Encounter (Signed)
Anastrozole refill approved. Per last ovn, patient tolerating and is to continue therapy.

## 2022-03-05 LAB — LIPID PANEL
Chol/HDL Ratio: 3.3 ratio (ref 0.0–4.4)
Cholesterol, Total: 156 mg/dL (ref 100–199)
HDL: 47 mg/dL (ref >39)
LDL Chol Calc (NIH): 93 mg/dL (ref 0–99)
Triglycerides: 84 mg/dL (ref 0–149)
VLDL Cholesterol Cal: 16 mg/dL (ref 5–40)

## 2022-06-19 ENCOUNTER — Encounter: Payer: Self-pay | Admitting: *Deleted

## 2022-06-19 ENCOUNTER — Ambulatory Visit (INDEPENDENT_AMBULATORY_CARE_PROVIDER_SITE_OTHER): Payer: 59 | Admitting: Family Medicine

## 2022-06-19 ENCOUNTER — Encounter: Payer: Self-pay | Admitting: Family Medicine

## 2022-06-19 VITALS — BP 118/72 | HR 75 | Ht 68.0 in | Wt 188.0 lb

## 2022-06-19 DIAGNOSIS — Z1211 Encounter for screening for malignant neoplasm of colon: Secondary | ICD-10-CM | POA: Diagnosis not present

## 2022-06-19 DIAGNOSIS — Z23 Encounter for immunization: Secondary | ICD-10-CM

## 2022-06-19 DIAGNOSIS — E663 Overweight: Secondary | ICD-10-CM

## 2022-06-19 DIAGNOSIS — Z853 Personal history of malignant neoplasm of breast: Secondary | ICD-10-CM

## 2022-06-19 NOTE — Progress Notes (Signed)
New Patient Office Visit  Subjective    Patient ID: Lydia Stevens, female    DOB: 26-Aug-1971  Age: 51 y.o. MRN: [id number]  CC:  Chief Complaint  Patient presents with   Establish Care    Would like flu vaccine today.    HPI Lydia Stevens is a pleasant 51 year old female who presents today to establish care with a new provider. She was oriented to practice routines and expectations. Her past medical history includes breast cancer, osteopenia, hyperlipidemia, GERD, and Vitamin D deficiency. She is interested in getting colon cancer screening, other screenings UTD.  Has tried several diets for weight loss- Keto, Golo, phentermine; she does not have a current exercise routine.  No tobacco, drugs, or alcohol use. Does not need STI testing.   Outpatient Encounter Medications as of 06/19/2022  Medication Sig   anastrozole (ARIMIDEX) 1 MG tablet TAKE 1 TABLET(1 MG) BY MOUTH DAILY   BIOTIN PO Take by mouth. Daily   calcium carbonate (CALCIUM 600) 600 MG TABS tablet Take 1 tablet (600 mg total) by mouth 2 (two) times daily with a meal.   Probiotic Product (DIGESTIVE ADVANTAGE PO) Take by mouth. Once daily   simvastatin (ZOCOR) 20 MG tablet Take 1 tablet (20 mg total) by mouth daily at 6 PM.   triamcinolone cream (KENALOG) 0.1 % Apply 1 Application topically 2 (two) times daily.   [DISCONTINUED] cholecalciferol (VITAMIN D3) 25 MCG (1000 UNIT) tablet Take 1 tablet (1,000 Units total) by mouth daily. (Patient not taking: Reported on 03/01/2022)   No facility-administered encounter medications on file as of 06/19/2022.    Past Medical History:  Diagnosis Date   Anemia    before    Blood transfusion without reported diagnosis    Breast cancer ([place])    2015   Cancer ([place])    Phreesia 07/02/2020   GERD (gastroesophageal reflux disease)    History of breast cancer 12/29/2014   Hives Mar 3rd 2016   Hyperlipidemia    Osteoporosis 08/18/2015   Scoliosis     Past Surgical  History:  Procedure Laterality Date   BREAST RECONSTRUCTION Right 12/06/2014   Procedure: RIGHT NIPPLE AREOLAR RECONSTRUCTION WITH FULL THICKNESS SKIN GRAFT FROM RIGHT THIGH;  Surgeon: Irene Limbo, MD;  Location: [facility];  Service: Plastics;  Laterality: Right;   BREAST REDUCTION SURGERY Left 12/06/2014   Procedure: LEFT BREAST REDUCTION FOR ASYMMETRY;  Surgeon: Irene Limbo, MD;  Location: [facility];  Service: Plastics;  Laterality: Left;   BREAST SURGERY N/A    Phreesia 07/02/2020   CESAREAN SECTION  1996   CESAREAN SECTION N/A    Phreesia 07/02/2020   LATISSIMUS FLAP TO BREAST Right 07/27/2014   Procedure: TRAM FLAP RECONSTUCTION RIGHT CHEST;  Surgeon: Irene Limbo, MD;  Location: [facility];  Service: Plastics;  Laterality: Right;   MASTECTOMY Right    MASTECTOMY W/ SENTINEL NODE BIOPSY Right 07/27/2014   Procedure: RIGHT MASTECTOMY WITH RIGHT AXILLARY SENTINEL LYMPH NODE BIOPSY;  Surgeon: Alphonsa Overall, MD;  Location: [facility];  Service: General;  Laterality: Right;   OOPHORECTOMY  2016   PORT-A-CATH REMOVAL Left 12/06/2014   Procedure: REMOVAL PORT-A-CATH;  Surgeon: Irene Limbo, MD;  Location: [facility];  Service: Plastics;  Laterality: Left;   PORTACATH PLACEMENT Left 09/05/2013   REDUCTION MAMMAPLASTY Left    WISDOM TOOTH EXTRACTION      Family History  Problem Relation Age of Onset   Heart attack Father  Arthritis Father    Heart disease Father        Massive MI   Breast cancer Maternal Aunt 41   Breast cancer Sister 72   Cancer Sister    Breast cancer Maternal Aunt 74   Arthritis Mother    Hypertension Mother    Asthma Sister    Alcohol abuse Brother    Hypertension Brother    Cancer Sister        1/2 - breast cancer   Breast cancer Paternal Aunt 5    Social History   Socioeconomic History   Marital status: Married    Spouse name: Not on file   Number of children: 1   Years of education: Not on file    Highest education level: Not on file  Occupational History   Not on file  Tobacco Use   Smoking status: Never   Smokeless tobacco: Never  Vaping Use   Vaping Use: Never used  Substance and Sexual Activity   Alcohol use: No    Alcohol/week: 0.0 standard drinks of alcohol   Drug use: No   Sexual activity: Yes    Birth control/protection: None  Other Topics Concern   Not on file  Social History Narrative   Right Handed   Lives in a one story home    Drinks Caffeine Occasionally    Social Determinants of Health   Financial Resource Strain: Not on file  Food Insecurity: Not on file  Transportation Needs: Not on file  Physical Activity: Not on file  Stress: Not on file  Social Connections: Not on file  Intimate Partner Violence: Not on file    Review of Systems  Constitutional: Negative.   HENT: Negative.    Eyes: Negative.   Respiratory: Negative.    Cardiovascular: Negative.   Gastrointestinal: Negative.   Genitourinary: Negative.   Musculoskeletal: Negative.   Skin: Negative.   Neurological: Negative.   Endo/Heme/Allergies: Negative.   Psychiatric/Behavioral: Negative.    All other systems reviewed and are negative.       Objective    BP 118/72   Pulse 75   Ht '5\' 8"'$  (1.727 m)   Wt 188 lb (85.3 kg)   SpO2 99%   BMI 28.59 kg/m   Physical Exam Vitals and nursing note reviewed.  Constitutional:      Appearance: Normal appearance.  HENT:     Head: Normocephalic and atraumatic.  Cardiovascular:     Rate and Rhythm: Normal rate and regular rhythm.     Pulses: Normal pulses.     Heart sounds: Normal heart sounds.  Pulmonary:     Effort: Pulmonary effort is normal.     Breath sounds: Normal breath sounds.  Skin:    General: Skin is warm and dry.  Neurological:     General: No focal deficit present.     Mental Status: She is alert and oriented to person, place, and time. Mental status is at baseline.  Psychiatric:        Mood and Affect: Mood normal.         Behavior: Behavior normal.        Thought Content: Thought content normal.        Judgment: Judgment normal.        Assessment & Plan:   1. Screening for colon cancer Declines Cologuard, agrees to go ahead with colonoscopy - Ambulatory referral to Gastroenterology  2. History of breast cancer Followed by Oncology with annual visits.  3. Need  for immunization against influenza - Flu Vaccine QUAD 90moIM (Fluarix, Fluzone & Alfiuria Quad PF)  4. Encounter for herpes zoster vaccination - Varicella-zoster vaccine IM  5. Overweight (BMI 25.0-29.9) Discussed the importance of maintaining a healthy weight for overall health and options for weight loss. She would like to start with diet modifications focusing on cutting out sugar and limiting portions. I am very proud of her for being so motivated! I also encouraged 150 minutes of exercise weekly and resuming her daily walks.    Return in about 6 weeks (around 08/02/2022) for annual physical.   ARubie Maid FNP

## 2022-07-05 ENCOUNTER — Encounter: Payer: Self-pay | Admitting: *Deleted

## 2022-07-05 NOTE — Patient Instructions (Signed)
  Procedure: colonoscopy  Estimated body mass index is 29.44 kg/m as calculated from the following:   Height as of this encounter: '5\' 7"'$  (1.702 m).   Weight as of this encounter: 188 lb (85.3 kg).   Have you had a colonoscopy before?  No  Do you have family history of colon cancer  No  Do you have a family history of polyps? No  Previous colonoscopy with polyps removed? No  Do you have a history colorectal cancer?   No  Are you diabetic?  No  Do you have a prosthetic or mechanical heart valve? No  Do you have a pacemaker/defibrillator?   No  Have you had endocarditis/atrial fibrillation?  No  Do you use supplemental oxygen/CPAP?  No  Have you had joint replacement within the last 12 months?  No  Do you tend to be constipated or have to use laxatives?  No   Do you have history of alcohol use? If yes, how much and how often.  No  Do you have history or are you using drugs? If yes, what do are you  using?  No  Have you ever had a stroke/heart attack?  No  Have you ever had a heart or other vascular stent placed,?No  Do you take weight loss medication? No  female patients,: have you had a hysterectomy? Yes, partial                              are you post menopausal?  yes                              do you still have your menstrual cycle? No    Date of last menstrual period. unknown  Do you take any blood-thinning medications such as: (Plavix, aspirin, Coumadin, Aggrenox, Brilinta, Xarelto, Eliquis, Pradaxa, Savaysa or Effient) No  If yes we need the name, milligram, dosage and who is prescribing doctor:  n/a             Current Outpatient Medications  Medication Sig Dispense Refill   anastrozole (ARIMIDEX) 1 MG tablet TAKE 1 TABLET(1 MG) BY MOUTH DAILY 30 tablet 5   BIOTIN PO Take by mouth. Daily     Lactobacillus-Inulin ([place]) CHEW Chew by mouth.     Probiotic Product (DIGESTIVE ADVANTAGE PO) Take by mouth. Once daily     simvastatin  (ZOCOR) 20 MG tablet Take 1 tablet (20 mg total) by mouth daily at 6 PM. 90 tablet 3   No current facility-administered medications for this visit.    Allergies  Allergen Reactions   Oxycodone Nausea Only

## 2022-07-05 NOTE — Progress Notes (Signed)
ASA 2, okay to schedule TCS.

## 2022-07-08 ENCOUNTER — Encounter (INDEPENDENT_AMBULATORY_CARE_PROVIDER_SITE_OTHER): Payer: Self-pay | Admitting: *Deleted

## 2022-07-08 MED ORDER — CLENPIQ 10-3.5-12 MG-GM -GM/175ML PO SOLN: 1.0000 | ORAL | 0 refills | Status: DC

## 2022-07-08 NOTE — Progress Notes (Signed)
Called pt. Scheduled with Dr. Abbey Chatters 12/29 at [facility] will send instructions. Rx for prep to the pharmacy

## 2022-07-08 NOTE — Progress Notes (Unsigned)
PA submitted via [place]. NO PA is required. Decision ID #:[id number]

## 2022-07-08 NOTE — Addendum Note (Signed)
Addended by: Cheron Every on: 07/08/2022 03:20 PM   Modules accepted: Orders

## 2022-08-04 ENCOUNTER — Other Ambulatory Visit: Payer: Self-pay | Admitting: Nurse Practitioner

## 2022-08-04 DIAGNOSIS — E785 Hyperlipidemia, unspecified: Secondary | ICD-10-CM

## 2022-08-05 ENCOUNTER — Encounter: Payer: Self-pay | Admitting: Family Medicine

## 2022-08-05 ENCOUNTER — Ambulatory Visit (INDEPENDENT_AMBULATORY_CARE_PROVIDER_SITE_OTHER): Payer: 59 | Admitting: Family Medicine

## 2022-08-05 VITALS — BP 112/62 | HR 74 | Ht 67.0 in | Wt 183.0 lb

## 2022-08-05 DIAGNOSIS — Z853 Personal history of malignant neoplasm of breast: Secondary | ICD-10-CM | POA: Diagnosis not present

## 2022-08-05 DIAGNOSIS — E559 Vitamin D deficiency, unspecified: Secondary | ICD-10-CM | POA: Diagnosis not present

## 2022-08-05 DIAGNOSIS — Z0000 Encounter for general adult medical examination without abnormal findings: Secondary | ICD-10-CM | POA: Diagnosis not present

## 2022-08-05 DIAGNOSIS — E663 Overweight: Secondary | ICD-10-CM

## 2022-08-05 NOTE — Progress Notes (Signed)
Complete physical exam  Patient: Lydia Stevens   DOB: August 04, 1971   51 y.o. Female  MRN: [id number]  Subjective:    Chief Complaint  Patient presents with   Annual Exam    Pt here for annual exam and pap smear.    Lydia Stevens is a 51 y.o. female who presents today for a complete physical exam. She reports consuming a general diet. The patient does not participate in regular exercise at present. She generally feels well. She reports sleeping well. She does not have additional problems to discuss today.     Most recent fall risk assessment:    06/19/2022    8:05 AM  [place] in the past year? 0  Number falls in past yr: 0  Injury with Fall? 0  Risk for fall due to : No Fall Risks  Follow up Falls prevention discussed     Most recent depression screenings:    08/05/2022    8:19 AM 06/19/2022    8:05 AM  PHQ 2/9 Scores  PHQ - 2 Score 0 0  PHQ- 9 Score 0     Dental: No current dental problems  Past Medical History:  Diagnosis Date   Anemia    before    Blood transfusion without reported diagnosis    Breast cancer ([place])    2015   Cancer ([place])    Phreesia 07/02/2020   GERD (gastroesophageal reflux disease)    History of breast cancer 12/29/2014   Hives Mar 3rd 2016   Hyperlipidemia    Osteoporosis 08/18/2015   Scoliosis    Past Surgical History:  Procedure Laterality Date   BREAST RECONSTRUCTION Right 12/06/2014   Procedure: RIGHT NIPPLE AREOLAR RECONSTRUCTION WITH FULL THICKNESS SKIN GRAFT FROM RIGHT THIGH;  Surgeon: Irene Limbo, MD;  Location: [facility];  Service: Plastics;  Laterality: Right;   BREAST REDUCTION SURGERY Left 12/06/2014   Procedure: LEFT BREAST REDUCTION FOR ASYMMETRY;  Surgeon: Irene Limbo, MD;  Location: [facility];  Service: Plastics;  Laterality: Left;   BREAST SURGERY N/A    Phreesia 07/02/2020   CESAREAN SECTION  1996   CESAREAN SECTION N/A    Phreesia 07/02/2020    LATISSIMUS FLAP TO BREAST Right 07/27/2014   Procedure: TRAM FLAP RECONSTUCTION RIGHT CHEST;  Surgeon: Irene Limbo, MD;  Location: [facility];  Service: Plastics;  Laterality: Right;   MASTECTOMY Right    MASTECTOMY W/ SENTINEL NODE BIOPSY Right 07/27/2014   Procedure: RIGHT MASTECTOMY WITH RIGHT AXILLARY SENTINEL LYMPH NODE BIOPSY;  Surgeon: Alphonsa Overall, MD;  Location: [facility];  Service: General;  Laterality: Right;   OOPHORECTOMY  2016   PORT-A-CATH REMOVAL Left 12/06/2014   Procedure: REMOVAL PORT-A-CATH;  Surgeon: Irene Limbo, MD;  Location: [facility];  Service: Plastics;  Laterality: Left;   PORTACATH PLACEMENT Left 09/05/2013   REDUCTION MAMMAPLASTY Left    WISDOM TOOTH EXTRACTION     Family History  Problem Relation Age of Onset   Heart attack Father    Arthritis Father    Heart disease Father        Massive MI   Breast cancer Maternal Aunt 13   Breast cancer Sister 60   Cancer Sister    Breast cancer Maternal Aunt 53   Arthritis Mother    Hypertension Mother    Asthma Sister    Alcohol abuse Brother    Hypertension Brother    Cancer Sister  1/2 - breast cancer   Breast cancer Paternal Aunt 16   Allergies  Allergen Reactions   Oxycodone Nausea Only      Patient Care Team: Rubie Maid, FNP as PCP - General (Family Medicine) Alphonsa Overall, MD as Consulting Physician (General Surgery) Nicholas Lose, MD as Consulting Physician (Hematology and Oncology) Gery Pray, MD as Consulting Physician (Radiation Oncology) Alda Berthold, DO as Consulting Physician (Neurology)   Outpatient Medications Prior to Visit  Medication Sig   anastrozole (ARIMIDEX) 1 MG tablet TAKE 1 TABLET(1 MG) BY MOUTH DAILY   BIOTIN PO Take by mouth. Daily   Lactobacillus-Inulin ([place]) CHEW Chew by mouth.   Probiotic Product (DIGESTIVE ADVANTAGE PO) Take by mouth. Once daily   simvastatin (ZOCOR) 20 MG tablet Take 1 tablet (20 mg total) by mouth  daily at 6 PM.   Sod Picosulfate-Mag Ox-Cit Acd (CLENPIQ) 10-3.5-12 MG-GM -GM/175ML SOLN Take 1 kit by mouth as directed.   No facility-administered medications prior to visit.    Review of Systems  Constitutional: Negative.   HENT: Negative.    Eyes: Negative.   Respiratory: Negative.    Cardiovascular: Negative.   Gastrointestinal: Negative.   Genitourinary: Negative.   Musculoskeletal: Negative.   Skin: Negative.   Neurological: Negative.   Endo/Heme/Allergies: Negative.   Psychiatric/Behavioral: Negative.    All other systems reviewed and are negative.         Objective:     BP 112/62   Pulse 74   Ht _0  (1.702 m)   Wt 183 lb (83 kg)   LMP 08/26/2001   SpO2 99%   BMI 28.66 kg/m  BP Readings from Last 3 Encounters:  08/05/22 112/62  06/19/22 118/72  03/01/22 136/82      Physical Exam Vitals and nursing note reviewed.  Constitutional:      Appearance: Normal appearance. She is normal weight.  HENT:     Head: Normocephalic and atraumatic.     Right Ear: Tympanic membrane, ear canal and external ear normal.     Left Ear: Tympanic membrane, ear canal and external ear normal.     Nose: Nose normal.     Mouth/Throat:     Mouth: Mucous membranes are moist.     Pharynx: Oropharynx is clear.  Eyes:     Extraocular Movements: Extraocular movements intact.     Conjunctiva/sclera: Conjunctivae normal.     Pupils: Pupils are equal, round, and reactive to light.  Cardiovascular:     Rate and Rhythm: Normal rate and regular rhythm.     Pulses: Normal pulses.     Heart sounds: Normal heart sounds.  Pulmonary:     Effort: Pulmonary effort is normal.     Breath sounds: Normal breath sounds.  Abdominal:     General: Bowel sounds are normal.     Palpations: Abdomen is soft.  Musculoskeletal:        General: Normal range of motion.     Cervical back: Normal range of motion and neck supple.  Skin:    General: Skin is warm and dry.     Capillary Refill:  Capillary refill takes less than 2 seconds.  Neurological:     General: No focal deficit present.     Mental Status: She is alert and oriented to person, place, and time. Mental status is at baseline.  Psychiatric:        Mood and Affect: Mood normal.        Behavior: Behavior  normal.        Thought Content: Thought content normal.        Judgment: Judgment normal.      No results found for any visits on 08/05/22. Last CBC Lab Results  Component Value Date   WBC 7.8 08/06/2021   HGB 11.9 (L) 08/06/2021   HCT 35.1 (L) 08/06/2021   MCV 85.6 08/06/2021   MCH 29.0 08/06/2021   RDW 15.3 08/06/2021   PLT 245 86[birth year]1950   Last metabolic panel Lab Results  Component Value Date   GLUCOSE 92 08/06/2021   NA 137 08/06/2021   K 3.8 08/06/2021   CL 105 08/06/2021   CO2 28 08/06/2021   BUN 19 08/06/2021   CREATININE 0.95 08/06/2021   GFRNONAA >60 08/06/2021   CALCIUM 8.9 08/06/2021   PROT 7.5 08/06/2021   ALBUMIN 3.9 08/06/2021   BILITOT 0.9 08/06/2021   ALKPHOS 92 08/06/2021   AST 20 08/06/2021   ALT 16 08/06/2021   ANIONGAP 4 (L) 08/06/2021   Last lipids Lab Results  Component Value Date   CHOL 156 03/04/2022   HDL 47 03/04/2022   LDLCALC 93 03/04/2022   TRIG 84 03/04/2022   CHOLHDL 3.3 03/04/2022   Last hemoglobin A1c Lab Results  Component Value Date   HGBA1C 5.5 07/05/2020   Last thyroid functions Lab Results  Component Value Date   TSH 1.01 05/11/2018   Last vitamin D Lab Results  Component Value Date   VD25OH 101.67 (H) 08/06/2021   Last vitamin B12 and Folate Lab Results  Component Value Date   VITAMINB12 512 07/05/2020        Assessment & Plan:    Routine Health Maintenance and Physical Exam  Immunization History  Administered Date(s) Administered   Influenza,inj,Quad PF,6+ Mos 06/19/2022   Influenza-Unspecified 06/20/2021   MMR 06/07/1998   Moderna SARS-COV2 Booster Vaccination 06/20/2021   PFIZER(Purple Top)SARS-COV-2 Vaccination  11/08/2019, 11/30/2019   Td 12/08/2001   Tdap 01/17/2015   Zoster Recombinat (Shingrix) 03/01/2022, 06/19/2022    Health Maintenance  Topic Date Due   COVID-19 Vaccine (3 - Pfizer risk series) 07/18/2021   COLONOSCOPY (Pts 45-38yr Insurance coverage will need to be confirmed)  06/20/2023 (Originally 01/24/2017)   MAMMOGRAM  10/26/2023   PAP SMEAR-Modifier  08/10/2024   DTaP/Tdap/Td (3 - Td or Tdap) 01/16/2025   INFLUENZA VACCINE  Completed   Hepatitis C Screening  Completed   HIV Screening  Completed   Zoster Vaccines- Shingrix  Completed   HPV VACCINES  Aged Out    Discussed health benefits of physical activity, and encouraged her to engage in regular exercise appropriate for her age and condition.  Problem List Items Addressed This Visit       Other   Physical exam, annual - Primary    Today your medical history and labs were reviewed and routine physical exam was performed. Recommend 150 minutes of moderate intensity exercise weekly and consuming a well-balanced diet. Vaccine maintenance discussed. Appropriate health maintenance items reviewed. Return to office in 1 year for annual physical exam.       History of breast cancer   Vitamin D deficiency   Overweight (BMI 25.0-29.9)   Return in about 1 year (around 08/06/2023) for annual physical.     ARubie Maid FNP

## 2022-08-05 NOTE — Assessment & Plan Note (Signed)
Today your medical history and labs were reviewed and routine physical exam was performed. Recommend 150 minutes of moderate intensity exercise weekly and consuming a well-balanced diet. Vaccine maintenance discussed. Appropriate health maintenance items reviewed. Return to office in 1 year for annual physical exam.

## 2022-08-06 ENCOUNTER — Inpatient Hospital Stay: Payer: 59 | Attending: Hematology

## 2022-08-06 DIAGNOSIS — C50311 Malignant neoplasm of lower-inner quadrant of right female breast: Secondary | ICD-10-CM

## 2022-08-06 DIAGNOSIS — Z79811 Long term (current) use of aromatase inhibitors: Secondary | ICD-10-CM | POA: Diagnosis not present

## 2022-08-06 DIAGNOSIS — Z170 Estrogen receptor positive status [ER+]: Secondary | ICD-10-CM | POA: Diagnosis not present

## 2022-08-06 DIAGNOSIS — D0511 Intraductal carcinoma in situ of right breast: Secondary | ICD-10-CM | POA: Insufficient documentation

## 2022-08-06 LAB — CBC WITH DIFFERENTIAL/PLATELET
Abs Immature Granulocytes: 0.02 10*3/uL (ref 0.00–0.07)
Basophils Absolute: 0 10*3/uL (ref 0.0–0.1)
Basophils Relative: 1 %
Eosinophils Absolute: 0.2 10*3/uL (ref 0.0–0.5)
Eosinophils Relative: 2 %
HCT: 38.3 % (ref 36.0–46.0)
Hemoglobin: 12.2 g/dL (ref 12.0–15.0)
Immature Granulocytes: 0 %
Lymphocytes Relative: 25 %
Lymphs Abs: 1.7 10*3/uL (ref 0.7–4.0)
MCH: 27.8 pg (ref 26.0–34.0)
MCHC: 31.9 g/dL (ref 30.0–36.0)
MCV: 87.2 fL (ref 80.0–100.0)
Monocytes Absolute: 0.3 10*3/uL (ref 0.1–1.0)
Monocytes Relative: 5 %
Neutro Abs: 4.6 10*3/uL (ref 1.7–7.7)
Neutrophils Relative %: 67 %
Platelets: 246 10*3/uL (ref 150–400)
RBC: 4.39 MIL/uL (ref 3.87–5.11)
RDW: 15.3 % (ref 11.5–15.5)
WBC: 6.9 10*3/uL (ref 4.0–10.5)
nRBC: 0 % (ref 0.0–0.2)

## 2022-08-06 LAB — COMPREHENSIVE METABOLIC PANEL
ALT: 9 U/L (ref 0–44)
AST: 13 U/L — ABNORMAL LOW (ref 15–41)
Albumin: 3.9 g/dL (ref 3.5–5.0)
Alkaline Phosphatase: 92 U/L (ref 38–126)
Anion gap: 9 (ref 5–15)
BUN: 17 mg/dL (ref 6–20)
CO2: 27 mmol/L (ref 22–32)
Calcium: 9.6 mg/dL (ref 8.9–10.3)
Chloride: 107 mmol/L (ref 98–111)
Creatinine, Ser: 0.98 mg/dL (ref 0.44–1.00)
GFR, Estimated: >60 mL/min (ref >60)
Glucose, Bld: 83 mg/dL (ref 70–99)
Potassium: 3.8 mmol/L (ref 3.5–5.1)
Sodium: 143 mmol/L (ref 135–145)
Total Bilirubin: 0.4 mg/dL (ref 0.3–1.2)
Total Protein: 7.8 g/dL (ref 6.5–8.1)

## 2022-08-06 LAB — VITAMIN D 25 HYDROXY (VIT D DEFICIENCY, FRACTURES): Vit D, 25-Hydroxy: 64.63 ng/mL (ref 30–100)

## 2022-08-13 ENCOUNTER — Inpatient Hospital Stay (HOSPITAL_BASED_OUTPATIENT_CLINIC_OR_DEPARTMENT_OTHER): Payer: 59 | Admitting: Hematology

## 2022-08-13 VITALS — BP 115/80 | HR 87 | Temp 99.4°F | Resp 16 | Ht 67.32 in | Wt 185.7 lb

## 2022-08-13 DIAGNOSIS — C50311 Malignant neoplasm of lower-inner quadrant of right female breast: Secondary | ICD-10-CM

## 2022-08-13 DIAGNOSIS — Z170 Estrogen receptor positive status [ER+]: Secondary | ICD-10-CM

## 2022-08-13 DIAGNOSIS — E559 Vitamin D deficiency, unspecified: Secondary | ICD-10-CM

## 2022-08-13 DIAGNOSIS — D0511 Intraductal carcinoma in situ of right breast: Secondary | ICD-10-CM | POA: Diagnosis not present

## 2022-08-13 NOTE — Progress Notes (Signed)
[facility] [address] [place] [address]   Patient Care Team: Rubie Maid, FNP as PCP - General (Family Medicine) Alphonsa Overall, MD as Consulting Physician (General Surgery) Nicholas Lose, MD as Consulting Physician (Hematology and Oncology) Gery Pray, MD as Consulting Physician (Radiation Oncology) Alda Berthold, DO as Consulting Physician (Neurology)  SUMMARY OF ONCOLOGIC HISTORY: Oncology History  Breast cancer of lower-inner quadrant of right female breast ([place])  06/07/2014 Initial Biopsy   Right breast needle biopsy 5:00 position: Invasive ductal carcinoma with DCIS, ER 100%, PR 71%, Ki-67 33%, HER-2 negative ratio 1.03   06/17/2014 Breast MRI   Right breast: 10 x 7 x 5 mm biopsy-proven IDC with DCIS, left breast 7 x 7 x 7 mm fibroadenoma, left upper quadrant of the abdomen abutting the peritoneum 3 x 1.1 cm oval soft tissue mass   07/27/2014 Surgery   Right breast mastectomy: Invasive ductal carcinoma grade 3, 2 cm, intermediate grade DCIS, lymphovascular invasion identified, 2 SLN negative, T1 C. N0 M0 stage IA, ER positive, PR 7%, HER-2 negative, Ki-67 33%   07/27/2014 Oncotype testing   Recurrence Score of 24, placing patient in the intermediate risk group   09/06/2014 -  Chemotherapy   Taxotere/Cytoxan    09/08/2014 Adverse Reaction   Nasuea and voming three times x 2 days.  Added Aloxi and Emend to anti-emetic regimen.   10/18/2014 -  Chemotherapy   Zoladex   10/27/2014 Adverse Reaction   Hives, secondary to Zoladex?   02/16/2015 Surgery   Laparoscopic BSO with Dr. Alycia Rossetti     CHIEF COMPLIANT: Follow-up for right breast cancer ER+/PR+/HER 2-   INTERVAL HISTORY: Ms. Lydia Stevens is a 51 y.o. female seen for follow-up of right breast cancer.  She is tolerating anastrozole reasonably well.  She has occasional hot flashes.  Reports energy levels of 100%.  She has chronic right lower back pain occasionally goes down her right  leg.  REVIEW OF SYSTEMS:   Review of Systems  Constitutional:  Negative for appetite change and fatigue.  Endocrine: Positive for hot flashes (mild).  Neurological:  Negative for numbness.  All other systems reviewed and are negative.   I have reviewed the past medical history, past surgical history, social history and family history with the patient and they are unchanged from previous note.   ALLERGIES:   is allergic to oxycodone.   MEDICATIONS:  Current Outpatient Medications  Medication Sig Dispense Refill   anastrozole (ARIMIDEX) 1 MG tablet TAKE 1 TABLET(1 MG) BY MOUTH DAILY 30 tablet 5   BIOTIN PO Take by mouth. Daily     Lactobacillus-Inulin ([place]) CHEW Chew by mouth.     Probiotic Product (DIGESTIVE ADVANTAGE PO) Take by mouth. Once daily     simvastatin (ZOCOR) 20 MG tablet TAKE 1 TABLET(20 MG) BY MOUTH DAILY AT 6 PM 90 tablet 3   Sod Picosulfate-Mag Ox-Cit Acd (CLENPIQ) 10-3.5-12 MG-GM -GM/175ML SOLN Take 1 kit by mouth as directed. 350 mL 0   No current facility-administered medications for this visit.     PHYSICAL EXAMINATION: Performance status (ECOG): 1 - Symptomatic but completely ambulatory  Vitals:   08/13/22 0757  BP: 115/80  Pulse: 87  Resp: 16  Temp: 99.4 F (37.4 C)  SpO2: 99%   Wt Readings from Last 3 Encounters:  08/13/22 185 lb 11.2 oz (84.2 kg)  08/05/22 183 lb (83 kg)  07/05/22 188 lb (85.3 kg)   Physical Exam Vitals  reviewed.  Constitutional:      Appearance: Normal appearance.  Cardiovascular:     Rate and Rhythm: Normal rate and regular rhythm.     Pulses: Normal pulses.     Heart sounds: Normal heart sounds.  Pulmonary:     Effort: Pulmonary effort is normal.     Breath sounds: Normal breath sounds.  Chest:  Breasts:    Right: No swelling, bleeding, mass, skin change (reconsruction within normal limits) or tenderness.     Left: Normal. No swelling, bleeding, mass, skin change or tenderness.   Lymphadenopathy:     Upper Body:     Right upper body: No supraclavicular, axillary or pectoral adenopathy.     Left upper body: No supraclavicular, axillary or pectoral adenopathy.  Neurological:     General: No focal deficit present.     Mental Status: She is alert and oriented to person, place, and time.  Psychiatric:        Mood and Affect: Mood normal.        Behavior: Behavior normal.    Breast Exam Chaperone: Thana Ates     LABORATORY DATA:  I have reviewed the data as listed    Latest Ref Rng & Units 08/06/2022    8:45 AM 08/06/2021    9:17 AM 07/16/2021    8:10 AM  CMP  Glucose 70 - 99 mg/dL 83  92  82   BUN 6 - 20 mg/dL _0 Creatinine 0.44 - 1.00 mg/dL 0.98  0.95  0.98   Sodium 135 - 145 mmol/L 143  137  142   Potassium 3.5 - 5.1 mmol/L 3.8  3.8  4.3   Chloride 98 - 111 mmol/L 107  105  104   CO2 22 - 32 mmol/L _1 Calcium 8.9 - 10.3 mg/dL 9.6  8.9  9.9   Total Protein 6.5 - 8.1 g/dL 7.8  7.5  7.0   Total Bilirubin 0.3 - 1.2 mg/dL 0.4  0.9  0.3   Alkaline Phos 38 - 126 U/L 92  92    AST 15 - 41 U/L _2 ALT 0 - 44 U/L _3 No results found for: "CAN153" Lab Results  Component Value Date   WBC 6.9 08/06/2022   HGB 12.2 08/06/2022   HCT 38.3 08/06/2022   MCV 87.2 08/06/2022   PLT 246 08/06/2022   NEUTROABS 4.6 08/06/2022    ASSESSMENT:  1.  Stage I (PT1CPN0) right breast IDC: -Status post right mastectomy followed by TRAM flap in December 2015, 2 cm IDC, grade 3, 2 sentinel lymph node negative, ER/PR positive, HER-2 negative, Ki-67 33%. -Oncotype DX recurrence score of 24. -4 cycles of TC from 09/06/2014 through 11/08/2014. - Anastrozole was started around April 2016.  Anastrozole being continued beyond 5 years.    2.  Bone health: - DEXA scan on 07/29/2017 with T score -0.8. - She was started on Prolia every 6 months on 08/18/2015.  Last dose was on 02/17/2019.  Prolia was discontinued secondary to high  co-pays.    PLAN:  1.  Stage I (PT1CPN0) right breast IDC: - Screening mammogram left breast (10/25/2021): BI-RADS Category 1 - Physical examination did not reveal any suspicious nodules at the reconstructed right breast.  No palpable adenopathy.  No mass in the left breast. - Reviewed labs from 08/06/2022 which showed normal LFTs.  CBC  was normal. - Continue anastrozole for total of 10 years. - RTC 1 year for follow-up with repeat labs.  Will also schedule her for left breast mammogram in March 2024.     2.  Bone health: - Vitamin D level is 64. - Continue calcium and vitamin D supplements. - Recommend repeating bone density test prior to next visit.   Breast Cancer therapy associated bone loss: I have recommended calcium, Vitamin D and weight bearing exercises.  Orders placed this encounter:  No orders of the defined types were placed in this encounter.   The patient has a good understanding of the overall plan. She agrees with it. She will call with any problems that may develop before the next visit here.  Derek Jack, MD [place] [phone]

## 2022-08-13 NOTE — Patient Instructions (Addendum)
[facility]  Discharge Instructions  You were seen and examined today by Dr. Delton Coombes.  Please continue Anastrozole as prescribed. Your labs are normal.  Your next mammogram is due in March. It is also time for another bone density scan.  Follow-up as scheduled.  Thank you for choosing [facility] to provide your oncology and hematology care.   To afford each patient quality time with our provider, please arrive at least 15 minutes before your scheduled appointment time. You may need to reschedule your appointment if you arrive late (10 or more minutes). Arriving late affects you and other patients whose appointments are after yours.  Also, if you miss three or more appointments without notifying the office, you may be dismissed from the clinic at the provider's discretion.    Again, thank you for choosing [facility].  Our hope is that these requests will decrease the amount of time that you wait before being seen by our physicians.   If you have a lab appointment with the [facility] please come in thru the Main Entrance and check in at the main information desk.           _____________________________________________________________  Should you have questions after your visit to [facility], please contact our office at [phone] and follow the prompts.  Our office hours are 8:00 a.m. to 4:30 p.m. Monday - Thursday and 8:00 a.m. to 2:30 p.m. Friday.  Please note that voicemails left after 4:00 p.m. may not be returned until the following business day.  We are closed weekends and all major holidays.  You do have access to a nurse 24-7, just call the main number to the clinic [phone] and do not press any options, hold on the line and a nurse will answer the phone.    For prescription refill requests, have your pharmacy contact our office and allow 72 hours.    Masks are optional in the cancer  centers. If you would like for your care team to wear a mask while they are taking care of you, please let them know. You may have one support person who is at least 50 years old accompany you for your appointments.

## 2022-08-23 ENCOUNTER — Ambulatory Visit (HOSPITAL_COMMUNITY): Payer: 59 | Admitting: Anesthesiology

## 2022-08-23 ENCOUNTER — Other Ambulatory Visit: Payer: Self-pay

## 2022-08-23 ENCOUNTER — Encounter (HOSPITAL_COMMUNITY)
Admission: RE | Disposition: A | Discharge status: Home / Self Care | Payer: Self-pay | Source: Home / Self Care | Attending: Internal Medicine

## 2022-08-23 ENCOUNTER — Ambulatory Visit (HOSPITAL_COMMUNITY)
Admission: RE | Admit: 2022-08-23 | Discharge: 2022-08-23 | Disposition: A | Discharge status: Home / Self Care | Payer: 59 | Attending: Internal Medicine | Admitting: Internal Medicine

## 2022-08-23 ENCOUNTER — Encounter (HOSPITAL_COMMUNITY): Payer: Self-pay

## 2022-08-23 DIAGNOSIS — D649 Anemia, unspecified: Secondary | ICD-10-CM | POA: Diagnosis not present

## 2022-08-23 DIAGNOSIS — Z1211 Encounter for screening for malignant neoplasm of colon: Secondary | ICD-10-CM | POA: Diagnosis not present

## 2022-08-23 DIAGNOSIS — M419 Scoliosis, unspecified: Secondary | ICD-10-CM | POA: Insufficient documentation

## 2022-08-23 DIAGNOSIS — Z79899 Other long term (current) drug therapy: Secondary | ICD-10-CM | POA: Insufficient documentation

## 2022-08-23 DIAGNOSIS — K648 Other hemorrhoids: Secondary | ICD-10-CM

## 2022-08-23 DIAGNOSIS — K219 Gastro-esophageal reflux disease without esophagitis: Secondary | ICD-10-CM | POA: Insufficient documentation

## 2022-08-23 HISTORY — PX: COLONOSCOPY WITH PROPOFOL: SHX5780

## 2022-08-23 SURGERY — COLONOSCOPY WITH PROPOFOL
Anesthesia: General

## 2022-08-23 MED ORDER — LIDOCAINE HCL (CARDIAC) PF 100 MG/5ML IV SOSY
PREFILLED_SYRINGE | INTRAVENOUS | Status: DC | PRN
  Administered 2022-08-23: 50 mg via INTRATRACHEAL

## 2022-08-23 MED ORDER — PROPOFOL 10 MG/ML IV BOLUS
INTRAVENOUS | Status: DC | PRN
  Administered 2022-08-23: 100 mg via INTRAVENOUS

## 2022-08-23 MED ORDER — PROPOFOL 500 MG/50ML IV EMUL
INTRAVENOUS | Status: DC | PRN
  Administered 2022-08-23: 200 ug/kg/min via INTRAVENOUS

## 2022-08-23 MED ORDER — LACTATED RINGERS IV SOLN
INTRAVENOUS | Status: DC
  Administered 2022-08-23: 1000 mL via INTRAVENOUS

## 2022-08-23 NOTE — Op Note (Signed)
[facility] Patient Name: Lydia Stevens Procedure Date: 08/23/2022 7:45 AM MRN: [id number] Date of Birth: 10/31/1970 Attending MD: Elon Alas. Abbey Chatters , [place], [id number] CSN: [id number] Age: 51 Admit Type: Outpatient Procedure:                Colonoscopy Indications:              Screening for colorectal malignant neoplasm Providers:                Elon Alas. Abbey Chatters, DO, Tammy Vaught, RN, Everardo Pacific Referring MD:              Medicines:                See the Anesthesia note for documentation of the                            administered medications Complications:            No immediate complications. Estimated Blood Loss:     Estimated blood loss: none. Procedure:                Pre-Anesthesia Assessment:                           - The anesthesia plan was to use monitored                            anesthesia care (MAC).                           After obtaining informed consent, the colonoscope                            was passed under direct vision. Throughout the                            procedure, the patient's blood pressure, pulse, and                            oxygen saturations were monitored continuously. The                            [id number]-HQ190L ([id number]) scope was introduced through                            the anus and advanced to the the terminal ileum,                            with identification of the appendiceal orifice and                            IC valve. The colonoscopy was performed without                            difficulty. The patient tolerated the procedure  well. The quality of the bowel preparation was                            evaluated using the BBPS [facility] Bowel Preparation                            Scale) with scores of: Right Colon = 3, Transverse                            Colon = 3 and Left Colon = 3 (entire mucosa seen                            well with no  residual staining, small fragments of                            stool or opaque liquid). The total BBPS score                            equals 9. Scope In: 7:54:22 AM Scope Out: 8:05:11 AM Scope Withdrawal Time: 0 hours 6 minutes 17 seconds  Total Procedure Duration: 0 hours 10 minutes 49 seconds  Findings:      The perianal and digital rectal examinations were normal.      Non-bleeding internal hemorrhoids were found during retroflexion.      The terminal ileum appeared normal.      The exam was otherwise without abnormality on direct and retroflexion       views. Impression:               - Non-bleeding internal hemorrhoids.                           - The examined portion of the ileum was normal.                           - The examination was otherwise normal on direct                            and retroflexion views.                           - No specimens collected. Moderate Sedation:      Per Anesthesia Care Recommendation:           - Patient has a contact number available for                            emergencies. The signs and symptoms of potential                            delayed complications were discussed with the                            patient. Return to normal activities tomorrow.  Written discharge instructions were provided to the                            patient.                           - Resume previous diet.                           - Continue present medications.                           - Repeat colonoscopy in 10 years for screening                            purposes.                           - Return to GI clinic PRN. Procedure Code(s):        --- Professional ---                           D6222, Colorectal cancer screening; colonoscopy on                            individual not meeting criteria for high risk Diagnosis Code(s):        --- Professional ---                           Z12.11, Encounter for screening  for malignant                            neoplasm of colon                           K64.8, Other hemorrhoids CPT copyright 2022 American Medical Association. All rights reserved. The codes documented in this report are preliminary and upon coder review may  be revised to meet current compliance requirements. Elon Alas. Abbey Chatters, DO [place] Abbey Chatters, DO 08/23/2022 8:08:15 AM This report has been signed electronically. Number of Addenda: 0

## 2022-08-23 NOTE — Discharge Instructions (Addendum)

## 2022-08-23 NOTE — Anesthesia Postprocedure Evaluation (Signed)
Anesthesia Post Note  Patient: Lydia Stevens  Procedure(s) Performed: COLONOSCOPY WITH PROPOFOL  Patient location during evaluation: Phase II Anesthesia Type: General Level of consciousness: awake and alert and oriented Pain management: pain level controlled Vital Signs Assessment: post-procedure vital signs reviewed and stable Respiratory status: spontaneous breathing, nonlabored ventilation and respiratory function stable Cardiovascular status: blood pressure returned to baseline and stable Postop Assessment: no apparent nausea or vomiting Anesthetic complications: no  No notable events documented.   Last Vitals:  Vitals:   08/23/22 0725 08/23/22 0808  BP: 113/76 104/60  Pulse: 74 77  Resp: 12 18  Temp: 37.3 C 36.6 C  SpO2: 100% 99%    Last Pain:  Vitals:   08/23/22 0808  TempSrc: Oral  PainSc: 0-No pain                 Berry Godsey C Lannis Lichtenwalner

## 2022-08-23 NOTE — Anesthesia Preprocedure Evaluation (Signed)
Anesthesia Evaluation  Patient identified by MRN, date of birth, ID band Patient awake    Reviewed: Allergy & Precautions, H&P , NPO status , Patient's Chart, lab work & pertinent test results  Airway Mallampati: II  TM Distance: >3 FB Neck ROM: Full    Dental  (+) Dental Advisory Given, Teeth Intact   Pulmonary neg pulmonary ROS   Pulmonary exam normal breath sounds clear to auscultation       Cardiovascular negative cardio ROS Normal cardiovascular exam Rhythm:Regular Rate:Normal     Neuro/Psych  PSYCHIATRIC DISORDERS  Depression    negative neurological ROS     GI/Hepatic Neg liver ROS,GERD  Medicated and Controlled,,  Endo/Other  negative endocrine ROS    Renal/GU negative Renal ROS  negative genitourinary   Musculoskeletal Scoliosis     Abdominal   Peds negative pediatric ROS (+)  Hematology  (+) Blood dyscrasia, anemia   Anesthesia Other Findings   Reproductive/Obstetrics negative OB ROS                             Anesthesia Physical Anesthesia Plan  ASA: 2  Anesthesia Plan: General   Post-op Pain Management: Minimal or no pain anticipated   Induction: Intravenous  PONV Risk Score and Plan:   Airway Management Planned: Natural Airway and Nasal Cannula  Additional Equipment:   Intra-op Plan:   Post-operative Plan:   Informed Consent: I have reviewed the patients History and Physical, chart, labs and discussed the procedure including the risks, benefits and alternatives for the proposed anesthesia with the patient or authorized representative who has indicated his/her understanding and acceptance.     Dental advisory given  Plan Discussed with: CRNA and Surgeon  Anesthesia Plan Comments:        Anesthesia Quick Evaluation

## 2022-08-23 NOTE — H&P (Signed)
Primary Care Physician:  Rubie Maid, FNP Primary Gastroenterologist:  Dr. Abbey Chatters  Pre-Procedure History & Physical: HPI:  Lydia Stevens is a 51 y.o. female is here for first ever colonoscopy for colon cancer screening purposes.  Patient denies any family history of colorectal cancer.  No melena or hematochezia.  No abdominal pain or unintentional weight loss.  No change in bowel habits.  Overall feels well from a GI standpoint.  Past Medical History:  Diagnosis Date   Anemia    before    Blood transfusion without reported diagnosis    Breast cancer ([place])    2015   Cancer ([place])    Phreesia 07/02/2020   GERD (gastroesophageal reflux disease)    History of breast cancer 12/29/2014   Hives Mar 3rd 2016   Hyperlipidemia    Osteoporosis 08/18/2015   Scoliosis     Past Surgical History:  Procedure Laterality Date   BREAST RECONSTRUCTION Right 12/06/2014   Procedure: RIGHT NIPPLE AREOLAR RECONSTRUCTION WITH FULL THICKNESS SKIN GRAFT FROM RIGHT THIGH;  Surgeon: Irene Limbo, MD;  Location: [facility];  Service: Plastics;  Laterality: Right;   BREAST REDUCTION SURGERY Left 12/06/2014   Procedure: LEFT BREAST REDUCTION FOR ASYMMETRY;  Surgeon: Irene Limbo, MD;  Location: [facility];  Service: Plastics;  Laterality: Left;   BREAST SURGERY N/A    Phreesia 07/02/2020   CESAREAN SECTION  1996   CESAREAN SECTION N/A    Phreesia 07/02/2020   LATISSIMUS FLAP TO BREAST Right 07/27/2014   Procedure: TRAM FLAP RECONSTUCTION RIGHT CHEST;  Surgeon: Irene Limbo, MD;  Location: [facility];  Service: Plastics;  Laterality: Right;   MASTECTOMY Right    MASTECTOMY W/ SENTINEL NODE BIOPSY Right 07/27/2014   Procedure: RIGHT MASTECTOMY WITH RIGHT AXILLARY SENTINEL LYMPH NODE BIOPSY;  Surgeon: Alphonsa Overall, MD;  Location: [facility];  Service: General;  Laterality: Right;   OOPHORECTOMY  2016   PORT-A-CATH REMOVAL Left 12/06/2014   Procedure: REMOVAL PORT-A-CATH;   Surgeon: Irene Limbo, MD;  Location: [facility];  Service: Plastics;  Laterality: Left;   PORTACATH PLACEMENT Left 09/05/2013   REDUCTION MAMMAPLASTY Left    WISDOM TOOTH EXTRACTION      Prior to Admission medications   Medication Sig Start Date End Date Taking? Authorizing Provider  anastrozole (ARIMIDEX) 1 MG tablet TAKE 1 TABLET(1 MG) BY MOUTH DAILY 03/04/22  Yes Derek Jack, MD  BIOTIN PO Take 2 tablets by mouth daily.   Yes [provider]  Lactobacillus-Inulin (VIACTIV DIGESTIVE HEALTH) CHEW Chew 2 tablets by mouth daily. Soft chew   Yes [provider]  Probiotic Product (DIGESTIVE ADVANTAGE PO) Take 1 capsule by mouth daily.   Yes [provider]  simvastatin (ZOCOR) 20 MG tablet TAKE 1 TABLET(20 MG) BY MOUTH DAILY AT 6 PM 08/05/22  Yes Rubie Maid, FNP  Sod Picosulfate-Mag Ox-Cit Acd (CLENPIQ) 10-3.5-12 MG-GM -GM/175ML SOLN Take 1 kit by mouth as directed. 07/08/22  Yes Eloise Harman, DO  triamcinolone cream (KENALOG) 0.1 % Apply 1 Application topically daily as needed (Rash).   Yes [provider]    Allergies as of 07/08/2022 - Review Complete 07/05/2022  Allergen Reaction Noted   Oxycodone Nausea Only 11/29/2014    Family History  Problem Relation Age of Onset   Heart attack Father    Arthritis Father    Heart disease Father        Massive MI   Breast cancer Maternal Aunt 27  Breast cancer Sister 48   Cancer Sister    Breast cancer Maternal Aunt 64   Arthritis Mother    Hypertension Mother    Asthma Sister    Alcohol abuse Brother    Hypertension Brother    Cancer Sister        1/2 - breast cancer   Breast cancer Paternal Aunt 76    Social History   Socioeconomic History   Marital status: Married    Spouse name: Not on file   Number of children: 1   Years of education: Not on file   Highest education level: Not on file  Occupational History   Not on file  Tobacco Use   Smoking  status: Never   Smokeless tobacco: Never  Vaping Use   Vaping Use: Never used  Substance and Sexual Activity   Alcohol use: No    Alcohol/week: 0.0 standard drinks of alcohol   Drug use: No   Sexual activity: Yes    Birth control/protection: None  Other Topics Concern   Not on file  Social History Narrative   Right Handed   Lives in a one story home    Drinks Caffeine Occasionally    Social Determinants of Health   Financial Resource Strain: Not on file  Food Insecurity: Not on file  Transportation Needs: Not on file  Physical Activity: Not on file  Stress: Not on file  Social Connections: Not on file  Intimate Partner Violence: Not on file    Review of Systems: See HPI, otherwise negative ROS  Physical Exam: Vital signs in last 24 hours: Temp:  [99.1 F (37.3 C)] 99.1 F (37.3 C) (12/29 0725) Pulse Rate:  [74] 74 (12/29 0725) Resp:  [12] 12 (12/29 0725) BP: (113)/(76) 113/76 (12/29 0725) SpO2:  [100 %] 100 % (12/29 0725) Weight:  [83.9 kg] 83.9 kg (12/29 0725)   General:   Alert,  Well-developed, well-nourished, pleasant and cooperative in NAD Head:  Normocephalic and atraumatic. Eyes:  Sclera clear, no icterus.   Conjunctiva pink. Ears:  Normal auditory acuity. Nose:  No deformity, discharge,  or lesions. Msk:  Symmetrical without gross deformities. Normal posture. Extremities:  Without clubbing or edema. Neurologic:  Alert and  oriented x4;  grossly normal neurologically. Skin:  Intact without significant lesions or rashes. Psych:  Alert and cooperative. Normal mood and affect.  Impression/Plan: Lydia Stevens is here for a colonoscopy to be performed for colon cancer screening purposes.  The risks of the procedure including infection, bleed, or perforation as well as benefits, limitations, alternatives and imponderables have been reviewed with the patient. Questions have been answered. All parties agreeable.

## 2022-08-23 NOTE — Transfer of Care (Signed)
Immediate Anesthesia Transfer of Care Note  Patient: Lydia Stevens  Procedure(s) Performed: COLONOSCOPY WITH PROPOFOL  Patient Location: Endoscopy Unit  Anesthesia Type:General  Level of Consciousness: awake, alert , oriented, and patient cooperative  Airway & Oxygen Therapy: Patient Spontanous Breathing  Post-op Assessment: Report given to RN, Post -op Vital signs reviewed and stable, and Patient moving all extremities  Post vital signs: Reviewed and stable  Last Vitals:  Vitals Value Taken Time  BP    Temp    Pulse    Resp    SpO2      Last Pain:  Vitals:   08/23/22 0750  TempSrc:   PainSc: 0-No pain      Patients Stated Pain Goal: 8 (09/81/19 1478)  Complications: No notable events documented.

## 2022-08-30 ENCOUNTER — Encounter (HOSPITAL_COMMUNITY): Payer: Self-pay | Admitting: Internal Medicine

## 2022-08-30 ENCOUNTER — Other Ambulatory Visit (HOSPITAL_COMMUNITY): Payer: Self-pay | Admitting: Hematology

## 2022-09-03 ENCOUNTER — Ambulatory Visit: Payer: 59 | Admitting: Nurse Practitioner

## 2022-09-10 ENCOUNTER — Ambulatory Visit: Payer: 59 | Admitting: Nurse Practitioner

## 2022-10-31 ENCOUNTER — Other Ambulatory Visit (HOSPITAL_COMMUNITY): Payer: 59

## 2022-10-31 ENCOUNTER — Ambulatory Visit (HOSPITAL_COMMUNITY): Payer: 59

## 2022-11-04 ENCOUNTER — Other Ambulatory Visit (HOSPITAL_COMMUNITY): Payer: 59

## 2022-11-04 ENCOUNTER — Ambulatory Visit (HOSPITAL_COMMUNITY)
Admission: RE | Admit: 2022-11-04 | Discharge: 2022-11-04 | Disposition: A | Discharge status: Home / Self Care | Payer: 59 | Source: Ambulatory Visit | Attending: Hematology | Admitting: Hematology

## 2022-11-04 DIAGNOSIS — Z853 Personal history of malignant neoplasm of breast: Secondary | ICD-10-CM | POA: Insufficient documentation

## 2022-11-04 DIAGNOSIS — Z1231 Encounter for screening mammogram for malignant neoplasm of breast: Secondary | ICD-10-CM | POA: Insufficient documentation

## 2022-11-04 DIAGNOSIS — C50311 Malignant neoplasm of lower-inner quadrant of right female breast: Secondary | ICD-10-CM

## 2022-11-06 ENCOUNTER — Ambulatory Visit (HOSPITAL_COMMUNITY)
Admission: RE | Admit: 2022-11-06 | Discharge: 2022-11-06 | Disposition: A | Discharge status: Home / Self Care | Payer: 59 | Source: Ambulatory Visit | Attending: Hematology | Admitting: Hematology

## 2022-11-06 DIAGNOSIS — E559 Vitamin D deficiency, unspecified: Secondary | ICD-10-CM

## 2022-11-06 DIAGNOSIS — C50311 Malignant neoplasm of lower-inner quadrant of right female breast: Secondary | ICD-10-CM

## 2022-11-06 DIAGNOSIS — Z170 Estrogen receptor positive status [ER+]: Secondary | ICD-10-CM | POA: Diagnosis present

## 2023-02-25 ENCOUNTER — Other Ambulatory Visit: Payer: Self-pay | Admitting: Hematology

## 2023-05-29 ENCOUNTER — Telehealth: Payer: Self-pay

## 2023-05-29 NOTE — Telephone Encounter (Signed)
Pt called in to inquire about a biopsy she had performed at this office.Pt would like a copy of this and the results. Pt would like a cb from nurse please. Please advise  CB#: [phone]

## 2023-05-30 NOTE — Telephone Encounter (Signed)
Called spoke w/pt re biopsy. Per pt stated that it was like a biopsy, it was rash that she had, per pt NP just took a piece of skin. Per pt pt it was done about 2022 w/Jessica Martinez,NP. However, I tol pt, I didn't see anything under lab or procedure regarding biopsy. Pt voiced understanding and per pt will just have her dermatologist look her her skin.

## 2023-07-03 ENCOUNTER — Other Ambulatory Visit: Payer: 59

## 2023-07-03 DIAGNOSIS — E663 Overweight: Secondary | ICD-10-CM

## 2023-07-03 DIAGNOSIS — E782 Mixed hyperlipidemia: Secondary | ICD-10-CM

## 2023-07-03 DIAGNOSIS — E559 Vitamin D deficiency, unspecified: Secondary | ICD-10-CM

## 2023-07-04 ENCOUNTER — Other Ambulatory Visit: Payer: 59

## 2023-07-04 LAB — COMPLETE METABOLIC PANEL WITH GFR
AG Ratio: 1.2 (calc) (ref 1.0–2.5)
ALT: 11 U/L (ref 6–29)
AST: 14 U/L (ref 10–35)
Albumin: 3.9 g/dL (ref 3.6–5.1)
Alkaline phosphatase (APISO): 102 U/L (ref 37–153)
BUN/Creatinine Ratio: 15 (calc) (ref 6–22)
BUN: 16 mg/dL (ref 7–25)
CO2: 25 mmol/L (ref 20–32)
Calcium: 9.7 mg/dL (ref 8.6–10.4)
Chloride: 105 mmol/L (ref 98–110)
Creat: 1.05 mg/dL — ABNORMAL HIGH (ref 0.50–1.03)
Globulin: 3.3 g/dL (ref 1.9–3.7)
Glucose, Bld: 83 mg/dL (ref 65–99)
Potassium: 4.9 mmol/L (ref 3.5–5.3)
Sodium: 142 mmol/L (ref 135–146)
Total Bilirubin: 0.5 mg/dL (ref 0.2–1.2)
Total Protein: 7.2 g/dL (ref 6.1–8.1)
eGFR: 64 mL/min/{1.73_m2} (ref >=60)

## 2023-07-04 LAB — CBC WITH DIFFERENTIAL/PLATELET
Absolute Lymphocytes: 2249 {cells}/uL (ref 850–3900)
Absolute Monocytes: 498 {cells}/uL (ref 200–950)
Basophils Absolute: 50 {cells}/uL (ref 0–200)
Basophils Relative: 0.6 %
Eosinophils Absolute: 174 {cells}/uL (ref 15–500)
Eosinophils Relative: 2.1 %
HCT: 39.1 % (ref 35.0–45.0)
Hemoglobin: 12.6 g/dL (ref 11.7–15.5)
MCH: 28.1 pg (ref 27.0–33.0)
MCHC: 32.2 g/dL (ref 32.0–36.0)
MCV: 87.1 fL (ref 80.0–100.0)
MPV: 10.6 fL (ref 7.5–12.5)
Monocytes Relative: 6 %
Neutro Abs: 5329 {cells}/uL (ref 1500–7800)
Neutrophils Relative %: 64.2 %
Platelets: 295 10*3/uL (ref 140–400)
RBC: 4.49 10*6/uL (ref 3.80–5.10)
RDW: 13.5 % (ref 11.0–15.0)
Total Lymphocyte: 27.1 %
WBC: 8.3 10*3/uL (ref 3.8–10.8)

## 2023-07-04 LAB — LIPID PANEL
Cholesterol: 183 mg/dL (ref <200)
HDL: 56 mg/dL (ref >=50)
LDL Cholesterol (Calc): 111 mg/dL — ABNORMAL HIGH
Non-HDL Cholesterol (Calc): 127 mg/dL (ref <130)
Total CHOL/HDL Ratio: 3.3 (calc) (ref <5.0)
Triglycerides: 71 mg/dL (ref <150)

## 2023-07-07 ENCOUNTER — Ambulatory Visit: Payer: 59 | Admitting: Family Medicine

## 2023-07-07 ENCOUNTER — Encounter: Payer: Self-pay | Admitting: Family Medicine

## 2023-07-07 VITALS — BP 100/68 | HR 72 | Temp 98.3°F | Ht 67.0 in | Wt 190.0 lb

## 2023-07-07 DIAGNOSIS — Z0001 Encounter for general adult medical examination with abnormal findings: Secondary | ICD-10-CM

## 2023-07-07 DIAGNOSIS — Z0000 Encounter for general adult medical examination without abnormal findings: Secondary | ICD-10-CM

## 2023-07-07 DIAGNOSIS — E7800 Pure hypercholesterolemia, unspecified: Secondary | ICD-10-CM | POA: Diagnosis not present

## 2023-07-07 DIAGNOSIS — E663 Overweight: Secondary | ICD-10-CM | POA: Diagnosis not present

## 2023-07-07 NOTE — Progress Notes (Signed)
Complete physical exam  Patient: Lydia Stevens   DOB: September 16, 1970   52 y.o. Female  MRN: [id number]  Subjective:    Chief Complaint  Patient presents with   Follow-up    cpe and fasting labs (last cpe 08/05/2022)    Lydia Stevens is a 52 y.o. female who presents today for a complete physical exam. She reports consuming a general diet. The patient does not participate in regular exercise at present. She generally feels well. She reports sleeping well. She does not have additional problems to discuss today. [name] has had a recent fungal rash and this is being treated with cream as prescribed by dermatology and is improving.  The 10-year ASCVD risk score (Arnett DK, et al., 2019) is: 0.7%   Values used to calculate the score:     Age: 21 years     Sex: Female     Is Non-Hispanic African American: Yes     Diabetic: No     Tobacco smoker: No     Systolic Blood Pressure: 100 mmHg     Is BP treated: No     HDL Cholesterol: 56 mg/dL     Total Cholesterol: 183 mg/dL   Most recent fall risk assessment:    07/07/2023    3:13 PM  Fall Risk   Falls in the past year? 0  Number falls in past yr: 0  Injury with Fall? 0  Risk for fall due to : No Fall Risks     Most recent depression screenings:    07/07/2023    3:14 PM 08/05/2022    8:19 AM  PHQ 2/9 Scores  PHQ - 2 Score 0 0  PHQ- 9 Score 0 0    Vision:Not within last year  and Dental: No current dental problems and Receives regular dental care  Patient Active Problem List   Diagnosis Date Noted   Elevated low density lipoprotein (LDL) cholesterol level 07/07/2023   Eczema 03/01/2022   Hyperlipidemia 05/18/2018   Overweight (BMI 25.0-29.9) 05/02/2017   De Quervain's tenosynovitis 03/05/2017   Osteopenia 08/18/2015   Vitamin D deficiency 08/05/2015   Scoliosis of lumbar spine 04/18/2015   Situational depression 01/17/2015   History of breast cancer 12/29/2014   Acquired absence of breast and nipple  12/06/2014   Physical exam, annual 07/15/2014   Breast cancer of lower-inner quadrant of right female breast (HCC) 06/09/2014   Past Medical History:  Diagnosis Date   Anemia    before    Blood transfusion without reported diagnosis    Breast cancer (HCC)    2015   Cancer (HCC)    Phreesia 07/02/2020   GERD (gastroesophageal reflux disease)    History of breast cancer 12/29/2014   Hives Mar 3rd 2016   Hyperlipidemia    Osteoporosis 08/18/2015   Scoliosis    Past Surgical History:  Procedure Laterality Date   BREAST RECONSTRUCTION Right 12/06/2014   Procedure: RIGHT NIPPLE AREOLAR RECONSTRUCTION WITH FULL THICKNESS SKIN GRAFT FROM RIGHT THIGH;  Surgeon: Glenna Fellows, MD;  Location: [facility];  Service: Plastics;  Laterality: Right;   BREAST REDUCTION SURGERY Left 12/06/2014   Procedure: LEFT BREAST REDUCTION FOR ASYMMETRY;  Surgeon: Glenna Fellows, MD;  Location:  SUR[facility]ERY [facility];  Service: Plastics;  Laterality: Left;   BREAST SURGERY N/A    Phreesia 07/02/2020   CESAREAN SECTION  1996   CESAREAN SECTION N/A    Phreesia 07/02/2020   COLONOSCOPY WITH PROPOFOL N/A 08/23/2022  Procedure: COLONOSCOPY WITH PROPOFOL;  Surgeon: Lanelle Bal, DO;  [facility];  Service: Endoscopy;  Laterality: N/A;  8:00am, asa 2   LATISSIMUS FLAP TO BREAST Right 07/27/2014   Procedure: TRAM FLAP RECONSTUCTION RIGHT CHEST;  Surgeon: Glenna Fellows, MD;  [facility];  Service: Plastics;  Laterality: Right;   MASTECTOMY Right    MASTECTOMY W/ SENTINEL NODE BIOPSY Right 07/27/2014   Procedure: RIGHT MASTECTOMY WITH RIGHT AXILLARY SENTINEL LYMPH NODE BIOPSY;  Surgeon: Ovidio Kin, MD;  [facility];  Service: General;  Laterality: Right;   OOPHORECTOMY  2016   PORT-A-CATH REMOVAL Left 12/06/2014   Procedure: REMOVAL PORT-A-CATH;  Surgeon: Glenna Fellows, MD;  Location: [facility];  Service: Plastics;  Laterality: Left;   PORTACATH  PLACEMENT Left 09/05/2013   REDUCTION MAMMAPLASTY Left    WISDOM TOOTH EXTRACTION     Social History   Tobacco Use   Smoking status: Never   Smokeless tobacco: Never  Vaping Use   Vaping status: Never Used  Substance Use Topics   Alcohol use: No    Alcohol/week: 0.0 standard drinks of alcohol   Drug use: No   Family History  Problem Relation Age of Onset   Heart attack Father    Arthritis Father    Heart disease Father        Massive MI   Breast cancer Maternal Aunt 22   Breast cancer Sister 58   Cancer Sister    Breast cancer Maternal Aunt 56   Arthritis Mother    Hypertension Mother    Asthma Sister    Alcohol abuse Brother    Hypertension Brother    Cancer Sister        1/2 - breast cancer   Breast cancer Paternal Aunt 28   Allergies  Allergen Reactions   Oxycodone Nausea Only      Patient Care Team: Park Meo, FNP as PCP - General (Family Medicine) Ovidio Kin, MD as Consulting Physician (General Surgery) Antony Blackbird, MD as Consulting Physician (Radiation Oncology) Glendale Chard, DO as Consulting Physician (Neurology) Doreatha Massed, MD as Consulting Physician (Hematology)   Outpatient Medications Prior to Visit  Medication Sig   anastrozole (ARIMIDEX) 1 MG tablet TAKE 1 TABLET(1 MG) BY MOUTH DAILY   BIOTIN PO Take 2 tablets by mouth daily.   Lactobacillus-Inulin (VIACTIV DIGESTIVE HEALTH) CHEW Chew 2 tablets by mouth daily. Soft chew   Probiotic Product (DIGESTIVE ADVANTAGE PO) Take 1 capsule by mouth daily.   simvastatin (ZOCOR) 20 MG tablet TAKE 1 TABLET(20 MG) BY MOUTH DAILY AT 6 PM   triamcinolone cream (KENALOG) 0.1 % Apply 1 Application topically daily as needed (Rash).   No facility-administered medications prior to visit.    Review of Systems  Constitutional: Negative.   HENT: Negative.    Eyes: Negative.   Respiratory: Negative.    Cardiovascular: Negative.   Gastrointestinal: Negative.   Genitourinary: Negative.    Skin:  Positive for rash.  Neurological: Negative.   Endo/Heme/Allergies: Negative.   Psychiatric/Behavioral: Negative.    All other systems reviewed and are negative.         Objective:     BP 100/68   Pulse 72   Temp 98.3 F (36.8 C) (Oral)   Wt 190 lb (86.2 kg)   LMP  (Approximate)   SpO2 99%   BMI 29.32 kg/m  BP Readings from Last 3 Encounters:  07/07/23 100/68  08/23/22 104/60  08/13/22 115/80  Wt Readings from Last 3 Encounters:  07/07/23 190 lb (86.2 kg)  08/23/22 185 lb (83.9 kg)  08/13/22 185 lb 11.2 oz (84.2 kg)      Physical Exam Vitals and nursing note reviewed.  Constitutional:      Appearance: Normal appearance. She is normal weight.  HENT:     Head: Normocephalic and atraumatic.     Right Ear: Tympanic membrane, ear canal and external ear normal.     Left Ear: Tympanic membrane, ear canal and external ear normal.     Nose: Nose normal.     Mouth/Throat:     Mouth: Mucous membranes are moist.     Pharynx: Oropharynx is clear.  Eyes:     Extraocular Movements: Extraocular movements intact.     Conjunctiva/sclera: Conjunctivae normal.     Pupils: Pupils are equal, round, and reactive to light.  Cardiovascular:     Rate and Rhythm: Normal rate and regular rhythm.     Pulses: Normal pulses.     Heart sounds: Normal heart sounds.  Pulmonary:     Effort: Pulmonary effort is normal.     Breath sounds: Normal breath sounds.  Abdominal:     General: Bowel sounds are normal.     Palpations: Abdomen is soft.  Musculoskeletal:        General: Normal range of motion.     Cervical back: Normal range of motion and neck supple.  Skin:    General: Skin is warm and dry.     Capillary Refill: Capillary refill takes less than 2 seconds.  Neurological:     General: No focal deficit present.     Mental Status: She is alert and oriented to person, place, and time. Mental status is at baseline.  Psychiatric:        Mood and Affect: Mood normal.         Behavior: Behavior normal.        Thought Content: Thought content normal.        Judgment: Judgment normal.      No results found for any visits on 07/07/23. Last CBC Lab Results  Component Value Date   WBC 8.3 07/03/2023   HGB 12.6 07/03/2023   HCT 39.1 07/03/2023   MCV 87.1 07/03/2023   MCH 28.1 07/03/2023   RDW 13.5 07/03/2023   PLT 295 07/03/2023   Last metabolic panel Lab Results  Component Value Date   GLUCOSE 83 07/03/2023   NA 142 07/03/2023   K 4.9 07/03/2023   CL 105 07/03/2023   CO2 25 07/03/2023   BUN 16 07/03/2023   CREATININE 1.05 (H) 07/03/2023   EGFR 64 07/03/2023   CALCIUM 9.7 07/03/2023   PROT 7.2 07/03/2023   ALBUMIN 3.9 08/06/2022   BILITOT 0.5 07/03/2023   ALKPHOS 92 08/06/2022   AST 14 07/03/2023   ALT 11 07/03/2023   ANIONGAP 9 08/06/2022   Last lipids Lab Results  Component Value Date   CHOL 183 07/03/2023   HDL 56 07/03/2023   LDLCALC 111 (H) 07/03/2023   TRIG 71 07/03/2023   CHOLHDL 3.3 07/03/2023   Last hemoglobin A1c Lab Results  Component Value Date   HGBA1C 5.5 07/05/2020   Last thyroid functions Lab Results  Component Value Date   TSH 1.01 05/11/2018   Last vitamin D Lab Results  Component Value Date   VD25OH 64.63 08/06/2022   Last vitamin B12 and Folate Lab Results  Component Value Date   VITAMINB12 512 07/05/2020  Assessment & Plan:    Routine Health Maintenance and Physical Exam  Immunization History  Administered Date(s) Administered   Influenza,inj,Quad PF,6+ Mos 06/19/2022   Influenza-Unspecified 06/20/2021   MMR 06/07/1998   Moderna SARS-COV2 Booster Vaccination 06/20/2021   PFIZER(Purple Top)SARS-COV-2 Vaccination 11/08/2019, 11/30/2019   Td 12/08/2001   Tdap 01/17/2015   Zoster Recombinant(Shingrix) 03/01/2022, 06/19/2022    Health Maintenance  Topic Date Due   COVID-19 Vaccine (3 - Pfizer risk series) 07/18/2021   INFLUENZA VACCINE  03/27/2023   MAMMOGRAM  11/03/2024    DTaP/Tdap/Td (3 - Td or Tdap) 01/16/2025   Cervical Cancer Screening (HPV/Pap Cotest)  08/10/2026   Colonoscopy  08/23/2032   Hepatitis C Screening  Completed   HIV Screening  Completed   Zoster Vaccines- Shingrix  Completed   HPV VACCINES  Aged Out    Discussed health benefits of physical activity, and encouraged her to engage in regular exercise appropriate for her age and condition.  Problem List Items Addressed This Visit     Physical exam, annual - Primary    Today your medical history was reviewed and routine physical exam with labs was performed. Recommend 150 minutes of moderate intensity exercise weekly and consuming a well-balanced diet. Advised to stop smoking if a smoker, avoid smoking if a non-smoker, limit alcohol consumption to 1 drink per day for women and 2 drinks per day for men, and avoid illicit drug use. Counseled in mental health awareness and when to seek medical care. Vaccine maintenance discussed. Appropriate health maintenance items reviewed. Return to office in 1 year for annual physical exam.       Overweight (BMI 25.0-29.9)    BMI 29.32. Counseled on importance of weight management for overall health. Encouraged low calorie, heart healthy diet and moderate intensity exercise 150 minutes weekly. This is 3-5 times weekly for 30-50 minutes each session. Goal should be pace of 3 miles/hours, or walking 1.5 miles in 30 minutes and include strength training.       Elevated low density lipoprotein (LDL) cholesterol level    LDL 111. I recommend consuming a heart healthy diet such as Mediterranean diet or DASH diet with whole grains, fruits, vegetable, fish, lean meats, nuts, and olive oil. Limit sweets and processed foods. I also encourage moderate intensity exercise 150 minutes weekly. This is 3-5 times weekly for 30-50 minutes each session. Goal should be pace of 3 miles/hours, or walking 1.5 miles in 30 minutes. The 10-year ASCVD risk score (Arnett DK, et al., 2019)  is: 0.7%       Return in about 1 year (around 07/06/2024) for annual physical with labs 1 week prior.     Park Meo, FNP

## 2023-07-07 NOTE — Assessment & Plan Note (Signed)
BMI 29.32. Counseled on importance of weight management for overall health. Encouraged low calorie, heart healthy diet and moderate intensity exercise 150 minutes weekly. This is 3-5 times weekly for 30-50 minutes each session. Goal should be pace of 3 miles/hours, or walking 1.5 miles in 30 minutes and include strength training.

## 2023-07-07 NOTE — Assessment & Plan Note (Signed)
LDL 111. I recommend consuming a heart healthy diet such as Mediterranean diet or DASH diet with whole grains, fruits, vegetable, fish, lean meats, nuts, and olive oil. Limit sweets and processed foods. I also encourage moderate intensity exercise 150 minutes weekly. This is 3-5 times weekly for 30-50 minutes each session. Goal should be pace of 3 miles/hours, or walking 1.5 miles in 30 minutes. The 10-year ASCVD risk score (Arnett DK, et al., 2019) is: 0.7%

## 2023-07-07 NOTE — Assessment & Plan Note (Signed)

## 2023-07-30 ENCOUNTER — Inpatient Hospital Stay: Payer: 59 | Attending: Physician Assistant

## 2023-07-30 DIAGNOSIS — C50311 Malignant neoplasm of lower-inner quadrant of right female breast: Secondary | ICD-10-CM | POA: Insufficient documentation

## 2023-07-30 DIAGNOSIS — Z9011 Acquired absence of right breast and nipple: Secondary | ICD-10-CM | POA: Insufficient documentation

## 2023-07-30 DIAGNOSIS — Z79811 Long term (current) use of aromatase inhibitors: Secondary | ICD-10-CM | POA: Insufficient documentation

## 2023-07-30 DIAGNOSIS — Z170 Estrogen receptor positive status [ER+]: Secondary | ICD-10-CM | POA: Insufficient documentation

## 2023-08-01 ENCOUNTER — Inpatient Hospital Stay: Payer: 59

## 2023-08-01 DIAGNOSIS — E559 Vitamin D deficiency, unspecified: Secondary | ICD-10-CM

## 2023-08-01 DIAGNOSIS — C50311 Malignant neoplasm of lower-inner quadrant of right female breast: Secondary | ICD-10-CM | POA: Diagnosis present

## 2023-08-01 DIAGNOSIS — Z79811 Long term (current) use of aromatase inhibitors: Secondary | ICD-10-CM | POA: Diagnosis not present

## 2023-08-01 DIAGNOSIS — Z9011 Acquired absence of right breast and nipple: Secondary | ICD-10-CM | POA: Diagnosis not present

## 2023-08-01 DIAGNOSIS — Z170 Estrogen receptor positive status [ER+]: Secondary | ICD-10-CM

## 2023-08-01 LAB — COMPREHENSIVE METABOLIC PANEL
ALT: 15 U/L (ref 0–44)
AST: 15 U/L (ref 15–41)
Albumin: 4.1 g/dL (ref 3.5–5.0)
Alkaline Phosphatase: 90 U/L (ref 38–126)
Anion gap: 10 (ref 5–15)
BUN: 12 mg/dL (ref 6–20)
CO2: 26 mmol/L (ref 22–32)
Calcium: 9.8 mg/dL (ref 8.9–10.3)
Chloride: 102 mmol/L (ref 98–111)
Creatinine, Ser: 0.9 mg/dL (ref 0.44–1.00)
GFR, Estimated: >60 mL/min (ref >60)
Glucose, Bld: 99 mg/dL (ref 70–99)
Potassium: 3.9 mmol/L (ref 3.5–5.1)
Sodium: 138 mmol/L (ref 135–145)
Total Bilirubin: 0.6 mg/dL (ref <1.2)
Total Protein: 7.9 g/dL (ref 6.5–8.1)

## 2023-08-01 LAB — CBC WITH DIFFERENTIAL/PLATELET
Abs Immature Granulocytes: 0.02 10*3/uL (ref 0.00–0.07)
Basophils Absolute: 0.1 10*3/uL (ref 0.0–0.1)
Basophils Relative: 1 %
Eosinophils Absolute: 0.2 10*3/uL (ref 0.0–0.5)
Eosinophils Relative: 2 %
HCT: 38.6 % (ref 36.0–46.0)
Hemoglobin: 12.7 g/dL (ref 12.0–15.0)
Immature Granulocytes: 0 %
Lymphocytes Relative: 25 %
Lymphs Abs: 2.1 10*3/uL (ref 0.7–4.0)
MCH: 28.5 pg (ref 26.0–34.0)
MCHC: 32.9 g/dL (ref 30.0–36.0)
MCV: 86.7 fL (ref 80.0–100.0)
Monocytes Absolute: 0.5 10*3/uL (ref 0.1–1.0)
Monocytes Relative: 6 %
Neutro Abs: 5.5 10*3/uL (ref 1.7–7.7)
Neutrophils Relative %: 66 %
Platelets: 281 10*3/uL (ref 150–400)
RBC: 4.45 MIL/uL (ref 3.87–5.11)
RDW: 15 % (ref 11.5–15.5)
WBC: 8.3 10*3/uL (ref 4.0–10.5)
nRBC: 0 % (ref 0.0–0.2)

## 2023-08-01 LAB — VITAMIN D 25 HYDROXY (VIT D DEFICIENCY, FRACTURES): Vit D, 25-Hydroxy: 63.1 ng/mL (ref 30–100)

## 2023-08-05 NOTE — Progress Notes (Unsigned)
[facility] [address] [place], [place] [address]   CLINIC:  Medical Oncology/Hematology  PCP:  Park Meo, FNP [address] [place] [address] [phone]   REASON FOR VISIT:  Follow-up for right-sided breast cancer, s/p right mastectomy  PRIOR THERAPY: - Right mastectomy (07/27/2014) - Chemotherapy with Taxotere/Cytoxan followed by Zoladex  NGS Results: Not done  CURRENT THERAPY: Anastrozole  BRIEF ONCOLOGIC HISTORY:   Oncology History  Breast cancer of lower-inner quadrant of right female breast (HCC)  06/07/2014 Initial Biopsy   Right breast needle biopsy 5:00 position: Invasive ductal carcinoma with DCIS, ER 100%, PR 71%, Ki-67 33%, HER-2 negative ratio 1.03   06/17/2014 Breast MRI   Right breast: 10 x 7 x 5 mm biopsy-proven IDC with DCIS, left breast 7 x 7 x 7 mm fibroadenoma, left upper quadrant of the abdomen abutting the peritoneum 3 x 1.1 cm oval soft tissue mass   07/27/2014 Surgery   Right breast mastectomy: Invasive ductal carcinoma grade 3, 2 cm, intermediate grade DCIS, lymphovascular invasion identified, 2 SLN negative, T1 C. N0 M0 stage IA, ER positive, PR 7%, HER-2 negative, Ki-67 33%   07/27/2014 Oncotype testing   Recurrence Score of 24, placing patient in the intermediate risk group   09/06/2014 -  Chemotherapy   Taxotere/Cytoxan    09/08/2014 Adverse Reaction   Nasuea and voming three times x 2 days.  Added Aloxi and Emend to anti-emetic regimen.   10/18/2014 -  Chemotherapy   Zoladex   10/27/2014 Adverse Reaction   Hives, secondary to Zoladex?   02/16/2015 Surgery   Laparoscopic BSO with Dr. Duard Brady     CANCER STAGING: Cancer Staging  Breast cancer of lower-inner quadrant of right female breast [facility]) Staging form: Breast, AJCC 7th Edition - Clinical: Stage IA (T1b, N0, cM0) - Unsigned - Pathologic: Stage IA (T1c, N0, cM0) - Signed by Nile Riggs, MD on 09/13/2014   INTERVAL HISTORY:   Ms. Lydia Stevens, a 52 y.o. female, returns for routine follow-up of her history of right-sided breast cancer. Evelette was last seen on 08/13/2022 by Dr. Ellin Saba.   At today's visit, she  reports feeling ***.  She denies any recent hospitalizations, surgeries, or changes in her  baseline health status.  She denies any symptoms of recurrence such as new lumps, bone pain, chest pain, dyspnea, or abdominal pain.  *** *** She has no new headaches, seizures, or focal neurologic deficits. *** No B symptoms such as fever, chills, night sweats, unintentional weight loss.  She continues to take *** ARIMIDEX:  Hot flashes, mood swings, weight gain, alopecia, and musculoskeletal pain *** Calcium and vitamin D  She reports ***% energy and ***% appetite.  She is maintaining stable weight at this time.   ASSESSMENT & PLAN:  1.  Stage I (PT1CPN0) right breast IDC: - Status post right mastectomy followed by TRAM flap in December 2015, 2 cm IDC, grade 3, 2 sentinel lymph node negative, ER/PR positive, HER-2 negative, Ki-67 33%. - Oncotype DX recurrence score of 24. - 4 cycles of TC from 09/06/2014 through 11/08/2014. - Anastrozole was started around April 2016.  Anastrozole being continued x 10 years (through April 2026) - Most recent unilateral LEFT breast mammogram (11/04/2022) BI-RADS Category 1, negative - Physical exam today (08/06/2023): Negative for any suspicious nodules at the reconstructed right breast.  No palpable adenopathy.  No mass in left breast.  *** - Most recent labs (08/01/2023) with  normal CBC, normal CMP/LFTs - History/ROS negative for any "red flag" symptoms of recurrence *** - PLAN: Schedule for unilateral LEFT breast screening mammogram in March 2025 - Labs and RTC for office visit in December 2025  2.   Bone health: - DEXA scan on 07/29/2017 with T score -0.8. - She was started on Prolia every 6 months on 08/18/2015.  Last dose was on 02/17/2019.  Prolia was discontinued secondary to  high co-pays. - Most recent bone density scan (11/06/2022): T-score stable and normal at -0.8 - Vitamin D (08/01/2023) normal at 63.10.  Normal calcium 9.8. - PLAN: Continue calcium, vitamin D, and weightbearing exercises - Repeat bone density scan in 2 years (March 2026)  PLAN SUMMARY: >> Unilateral LEFT breast mammogram due March 2025 >> Labs in December 2025 = CBC/D, CMP, vitamin D >> OFFICE visit in December 2025 (1 week after labs)    REVIEW OF SYSTEMS: ***  Review of Systems - Oncology  PHYSICAL EXAM:   Performance status (ECOG): {CHL ONC ZO:[id number]} *** There were no vitals filed for this visit. Wt Readings from Last 3 Encounters:  07/07/23 190 lb (86.2 kg)  08/23/22 185 lb (83.9 kg)  08/13/22 185 lb 11.2 oz (84.2 kg)   Physical Exam   PAST MEDICAL/SURGICAL HISTORY:  Past Medical History:  Diagnosis Date   Anemia    before    Blood transfusion without reported diagnosis    Breast cancer (HCC)    2015   Cancer (HCC)    Phreesia 07/02/2020   GERD (gastroesophageal reflux disease)    History of breast cancer 12/29/2014   Hives Mar 3rd 2016   Hyperlipidemia    Osteoporosis 08/18/2015   Scoliosis    Past Surgical History:  Procedure Laterality Date   BREAST RECONSTRUCTION Right 12/06/2014   Procedure: RIGHT NIPPLE AREOLAR RECONSTRUCTION WITH FULL THICKNESS SKIN GRAFT FROM RIGHT THIGH;  Surgeon: Glenna Fellows, MD;  Location: [facility];  Service: Plastics;  Laterality: Right;   BREAST REDUCTION SURGERY Left 12/06/2014   Procedure: LEFT BREAST REDUCTION FOR ASYMMETRY;  Surgeon: Glenna Fellows, MD;  Location: [facility];  Service: Plastics;  Laterality: Left;   BREAST SURGERY N/A    Phreesia 07/02/2020   CESAREAN SECTION  1996   CESAREAN SECTION N/A    Phreesia 07/02/2020   COLONOSCOPY WITH PROPOFOL N/A 08/23/2022   Procedure: COLONOSCOPY WITH PROPOFOL;  Surgeon: Lanelle Bal, DO;  [facility];  Service: Endoscopy;   Laterality: N/A;  8:00am, asa 2   LATISSIMUS FLAP TO BREAST Right 07/27/2014   Procedure: TRAM FLAP RECONSTUCTION RIGHT CHEST;  Surgeon: Glenna Fellows, MD;  Location: [facility];  Service: Plastics;  Laterality: Right;   MASTECTOMY Right    MASTECTOMY W/ SENTINEL NODE BIOPSY Right 07/27/2014   Procedure: RIGHT MASTECTOMY WITH RIGHT AXILLARY SENTINEL LYMPH NODE BIOPSY;  Surgeon: Ovidio Kin, MD;  Location: [facility];  Service: General;  Laterality: Right;   OOPHORECTOMY  2016   PORT-A-CATH REMOVAL Left 12/06/2014   Procedure: REMOVAL PORT-A-CATH;  Surgeon: Glenna Fellows, MD;  Location: [facility];  Service: Plastics;  Laterality: Left;   PORTACATH PLACEMENT Left 09/05/2013   REDUCTION MAMMAPLASTY Left    WISDOM TOOTH EXTRACTION      SOCIAL HISTORY:  Social History   Socioeconomic History   Marital status: Married    Spouse name: Not on file   Number of children: 1   Years of education: Not on file   Highest education  level: Not on file  Occupational History   Not on file  Tobacco Use   Smoking status: Never   Smokeless tobacco: Never  Vaping Use   Vaping status: Never Used  Substance and Sexual Activity   Alcohol use: No    Alcohol/week: 0.0 standard drinks of alcohol   Drug use: No   Sexual activity: Yes    Birth control/protection: None  Other Topics Concern   Not on file  Social History Narrative   Right Handed   Lives in a one story home    Drinks Caffeine Occasionally    Social Determinants of Health   Financial Resource Strain: Not on file  Food Insecurity: Not on file  Transportation Needs: Not on file  Physical Activity: Not on file  Stress: Not on file  Social Connections: Not on file  Intimate Partner Violence: Not on file    FAMILY HISTORY:  Family History  Problem Relation Age of Onset   Heart attack Father    Arthritis Father    Heart disease Father        Massive MI   Breast cancer Maternal Aunt 29   Breast cancer Sister 29    Cancer Sister    Breast cancer Maternal Aunt 67   Arthritis Mother    Hypertension Mother    Asthma Sister    Alcohol abuse Brother    Hypertension Brother    Cancer Sister        1/2 - breast cancer   Breast cancer Paternal Aunt 59    CURRENT MEDICATIONS:  Current Outpatient Medications  Medication Sig Dispense Refill   anastrozole (ARIMIDEX) 1 MG tablet TAKE 1 TABLET(1 MG) BY MOUTH DAILY 30 tablet 5   BIOTIN PO Take 2 tablets by mouth daily.     Lactobacillus-Inulin (VIACTIV DIGESTIVE HEALTH) CHEW Chew 2 tablets by mouth daily. Soft chew     Probiotic Product (DIGESTIVE ADVANTAGE PO) Take 1 capsule by mouth daily.     simvastatin (ZOCOR) 20 MG tablet TAKE 1 TABLET(20 MG) BY MOUTH DAILY AT 6 PM 90 tablet 3   triamcinolone cream (KENALOG) 0.1 % Apply 1 Application topically daily as needed (Rash).     No current facility-administered medications for this visit.    ALLERGIES:  Allergies  Allergen Reactions   Oxycodone Nausea Only    LABORATORY DATA:  I have reviewed the labs as listed.     Latest Ref Rng & Units 08/01/2023   10:29 AM 07/03/2023    7:56 AM 08/06/2022    8:45 AM  CBC  WBC 4.0 - 10.5 K/uL 8.3  8.3  6.9   Hemoglobin 12.0 - 15.0 g/dL 09.8  11.9  14.7   Hematocrit 36.0 - 46.0 % 38.6  39.1  38.3   Platelets 150 - 400 K/uL 281  295  246       Latest Ref Rng & Units 08/01/2023   10:29 AM 07/03/2023    7:56 AM 08/06/2022    8:45 AM  CMP  Glucose 70 - 99 mg/dL 99  83  83   BUN 6 - 20 mg/dL 12  16  17    Creatinine 0.44 - 1.00 mg/dL 8.29  5.62  1.30   Sodium 135 - 145 mmol/L 138  142  143   Potassium 3.5 - 5.1 mmol/L 3.9  4.9  3.8   Chloride 98 - 111 mmol/L 102  105  107   CO2 22 - 32 mmol/L 26  25  27  Calcium 8.9 - 10.3 mg/dL 9.8  9.7  9.6   Total Protein 6.5 - 8.1 g/dL 7.9  7.2  7.8   Total Bilirubin <1.2 mg/dL 0.6  0.5  0.4   Alkaline Phos 38 - 126 U/L 90   92   AST 15 - 41 U/L 15  14  13    ALT 0 - 44 U/L 15  11  9      DIAGNOSTIC IMAGING:  I have  independently reviewed the scans and discussed with the patient. No results found.   WRAP UP:  All questions were answered. The patient knows to call the clinic with any problems, questions or concerns.  Medical decision making: ***  Time spent on visit: I spent {CHL ONC TIME VISIT - [id number]} counseling the patient face to face. The total time spent in the appointment was {CHL ONC TIME VISIT - ZHYQM:[id number]} and more than 50% was on counseling.  Carnella Guadalajara, PA-C  ***

## 2023-08-06 ENCOUNTER — Inpatient Hospital Stay (HOSPITAL_BASED_OUTPATIENT_CLINIC_OR_DEPARTMENT_OTHER): Payer: 59 | Admitting: Physician Assistant

## 2023-08-06 ENCOUNTER — Inpatient Hospital Stay: Payer: 59 | Admitting: Hematology

## 2023-08-06 VITALS — BP 104/77 | HR 76 | Temp 96.6°F | Resp 18 | Wt 188.7 lb

## 2023-08-06 DIAGNOSIS — C50311 Malignant neoplasm of lower-inner quadrant of right female breast: Secondary | ICD-10-CM

## 2023-08-06 DIAGNOSIS — Z1231 Encounter for screening mammogram for malignant neoplasm of breast: Secondary | ICD-10-CM

## 2023-08-06 DIAGNOSIS — E559 Vitamin D deficiency, unspecified: Secondary | ICD-10-CM | POA: Diagnosis not present

## 2023-08-06 DIAGNOSIS — Z170 Estrogen receptor positive status [ER+]: Secondary | ICD-10-CM

## 2023-08-06 NOTE — Patient Instructions (Signed)
[facility] at [facility] **VISIT SUMMARY & IMPORTANT INSTRUCTIONS **   You were seen today by Rojelio Brenner PA-C for your history of breast cancer.    HISTORY OF BREAST CANCER Your most recent labs, mammogram, and physical exam did not show any evidence of recurrent breast cancer. Continue taking anastrozole (Arimidex) daily. You will be due for mammogram of your left breast in March 2025. We will see you for office visit and physical exam in 1 year, but do not hesitate to reach out sooner if you have any questions or concerns.  BONE HEALTH: Since your breast cancer pill (anastrozole/Arimidex) can cause weakened bones, we checked bone density scan in March 2024.  The scan showed strong and healthy bones. Continue taking calcium and vitamin D to improve bone health. Continue weightbearing exercises to strengthen your bones.  ** Thank you for trusting me with your healthcare!  I strive to provide all of my patients with quality care at each visit.  If you receive a survey for this visit, I would be so grateful to you for taking the time to provide feedback.  Thank you in advance!  ~ Matea Stanard                   Dr. Doreatha Massed   &   Rojelio Brenner, PA-C   - - - - - - - - - - - - - - - - - -    Thank you for choosing [facility] at [facility] to provide your oncology and hematology care.  To afford each patient quality time with our provider, please arrive at least 15 minutes before your scheduled appointment time.   If you have a lab appointment with the Cancer Center please come in thru the Main Entrance and check in at the main information desk.  You need to re-schedule your appointment should you arrive 10 or more minutes late.  We strive to give you quality time with our providers, and arriving late affects you and other patients whose appointments are after yours.  Also, if you no show three or more times for appointments you  may be dismissed from the clinic at the providers discretion.     Again, thank you for choosing [facility].  Our hope is that these requests will decrease the amount of time that you wait before being seen by our physicians.       _____________________________________________________________  Should you have questions after your visit to [facility], please contact our office at [phone] and follow the prompts.  Our office hours are 8:00 a.m. and 4:30 p.m. Monday - Friday.  Please note that voicemails left after 4:00 p.m. may not be returned until the following business day.  We are closed weekends and major holidays.  You do have access to a nurse 24-7, just call the main number to the clinic [phone] and do not press any options, hold on the line and a nurse will answer the phone.    For prescription refill requests, have your pharmacy contact our office and allow 72 hours.

## 2023-08-07 ENCOUNTER — Encounter: Payer: 59 | Admitting: Family Medicine

## 2023-08-25 ENCOUNTER — Other Ambulatory Visit: Payer: Self-pay | Admitting: Hematology

## 2023-08-28 ENCOUNTER — Other Ambulatory Visit: Payer: Self-pay | Admitting: Family Medicine

## 2023-08-28 DIAGNOSIS — E785 Hyperlipidemia, unspecified: Secondary | ICD-10-CM

## 2023-09-04 ENCOUNTER — Ambulatory Visit: Payer: Self-pay | Admitting: Family Medicine

## 2023-09-04 NOTE — Telephone Encounter (Signed)
 Called patient for first triage attempt. No answer, LVM. Will call back.   Copied from CRM [id number]. Topic: Clinical - Medical Advice >> Sep 04, 2023 12:33 PM Graeme ORN wrote: Reason for CRM: Patient has symptoms of sore throat and drainage. She does not feel bad but she sounds bad. Would like to know if anything can be suggested to help. Thank You

## 2023-09-04 NOTE — Telephone Encounter (Signed)
 Chief Complaint: Sore throat Symptoms: Sore throat, hoarse voice, and productive cough Frequency: Since Monday Pertinent Negatives: Patient denies Fever Disposition: [] ED /[] Urgent Care (no appt availability in office) / [] Appointment(In office/virtual)/ []  [place] [facility]/ [x] Home Care/ [] Refused Recommended Disposition /[] [facility]/ []  Follow-up with PCP Additional Notes: Patient called in to report a sore throat and hoarse voice. Patient's voice sounded extremely hoarse on the phone. Patient stated that her symptoms started on Monday of this week. Patient stated she also has a productive cough. Patient denies fever and SOB at this time. Patient is currently taking Mucinex to help with congestion. This RN advised home care at this time. This RN advised patient to call back on Monday if symptoms are not resolved over the weekend. This RN also advised patient to continue home remedies and call back if symptoms worsen. Patient complied.   Reason for Disposition  [1] Sore throat with cough/cold symptoms AND [2] present < 5 days  Answer Assessment - Initial Assessment Questions 1. ONSET: When did the throat start hurting? (Hours or days ago)      Patient states symptoms started Monday morning   2. SEVERITY: How bad is the sore throat? (Scale 1-10; mild, moderate or severe)   - MILD (1-3):  Doesn't interfere with eating or normal activities.   - MODERATE (4-7): Interferes with eating some solids and normal activities.   - SEVERE (8-10):  Excruciating pain, interferes with most normal activities.   - SEVERE WITH DYSPHAGIA (10): Can't swallow liquids, drooling.     Patient rates sore throat about a 2 or 3, states it does not feel not bad at all  3. STREP EXPOSURE: Has there been any exposure to strep within the past week? If Yes, ask: What type of contact occurred?      Patient denies  4.  VIRAL SYMPTOMS: Are there any symptoms of a cold, such as a runny nose,  cough, hoarse voice or red eyes?      Patient's voice is extremely hoarse  5. FEVER: Do you have a fever? If Yes, ask: What is your temperature, how was it measured, and when did it start?     Patient denies  6. PUS ON THE TONSILS: Is there pus on the tonsils in the back of your throat?     Patient denies  7. OTHER SYMPTOMS: Do you have any other symptoms? (e.g., difficulty breathing, headache, rash)     Productive cough  Protocols used: Sore Throat-A-AH

## 2023-10-16 ENCOUNTER — Ambulatory Visit: Payer: Self-pay | Admitting: Family Medicine

## 2023-10-16 NOTE — Telephone Encounter (Signed)
Chief Complaint: Back pain Symptoms: Back pain radiating into left leg with numbness, weakness Frequency: since Monday Pertinent Negatives: Patient denies loss of bladder/bowel control Disposition: [] ED /[] Urgent Care (no appt availability in office) / [x] Appointment(In office/virtual)/ []  [place] [facility]/ [] Home Care/ [] Refused Recommended Disposition /[] [facility]/ []  Follow-up with PCP Additional Notes: Patient called in stating she has been experiencing back pain since Monday that has been radiating down her left leg. Patient states it is causing numbness and weakness in both legs, but moreso in the left leg. Patient states she has scoliosis and is unsure if this is the cause, or sciatica, but patient's pain is not resolved at all with pain medication. Patient rating pain 9/10. Patient appt made for tomorrow for further evaluation.    Copied from CRM [id number]. Topic: Clinical - Red Word Triage >> Oct 16, 2023  3:01 PM Dennison Nancy wrote: Red Word that prompted transfer to Nurse Triage: started monday and getting worse , lower back shooting pain going down left leg  rate pain 9 Reason for Disposition  Numbness in a leg or foot (i.e., loss of sensation)  Answer Assessment - Initial Assessment Questions 1. ONSET: "When did the pain begin?"      Monday 2. LOCATION: "Where does it hurt?" (upper, mid or lower back)     3 3. SEVERITY: "How bad is the pain?"  (e.g., Scale 1-10; mild, moderate, or severe)   - MILD (1-3): Doesn't interfere with normal activities.    - MODERATE (4-7): Interferes with normal activities or awakens from sleep.    - SEVERE (8-10): Excruciating pain, unable to do any normal activities.      9 4. PATTERN: "Is the pain constant?" (e.g., yes, no; constant, intermittent)      Depends on movement  5. RADIATION: "Does the pain shoot into your legs or somewhere else?"     Down left leg 6. CAUSE:  "What do you think is causing the back pain?"      Unsure,  but could be scoliosis or sciatica  7. BACK OVERUSE:  "Any recent lifting of heavy objects, strenuous work or exercise?"     No 8. MEDICINES: "What have you taken so far for the pain?" (e.g., nothing, acetaminophen, NSAIDS)     Bayer back and body 9. NEUROLOGIC SYMPTOMS: "Do you have any weakness, numbness, or problems with bowel/bladder control?"     Weakness and numbness in left leg 10. OTHER SYMPTOMS: "Do you have any other symptoms?" (e.g., fever, abdomen pain, burning with urination, blood in urine)       No  Protocols used: Back Pain-A-AH

## 2023-10-17 ENCOUNTER — Encounter: Payer: Self-pay | Admitting: Family Medicine

## 2023-10-17 ENCOUNTER — Ambulatory Visit (INDEPENDENT_AMBULATORY_CARE_PROVIDER_SITE_OTHER): Payer: 59 | Admitting: Family Medicine

## 2023-10-17 VITALS — BP 130/72 | HR 89 | Ht 67.0 in | Wt 188.0 lb

## 2023-10-17 DIAGNOSIS — M5442 Lumbago with sciatica, left side: Secondary | ICD-10-CM

## 2023-10-17 MED ORDER — HYDROMORPHONE HCL 2 MG PO TABS: 2.0000 mg | ORAL_TABLET | ORAL | 0 refills | Status: DC | PRN

## 2023-10-17 MED ORDER — PREDNISONE 20 MG PO TABS: 60.0000 mg | ORAL_TABLET | Freq: Every day | ORAL | 0 refills | Status: DC

## 2023-10-17 NOTE — Progress Notes (Signed)
Subjective:    Patient ID: Lydia Stevens, female    DOB: 10/04/1970, 53 y.o.   MRN: [id number]  Back Pain  Patient is in tears.  She reports severe unrelenting back pain.  It began on Monday.  The pain starts in her lower back radiates into her left gluteus and then radiates down her left hamstring to her knee.  She also complains of some numbness in the left leg.  She is unable to sit down due to the severity of the pain.  She denies any falls or injuries.  She denies any saddle anesthesia.  She denies any bowel or bladder incontinence.  There is no tenderness to palpation of the lumbar spinous processes.  There is no tenderness to palpation at the sciatic notch.  There is no tenderness to palpation of the gluteus or in the hamstring area.  Patient is crying due to the pain.  Pain is a 9 to a 10 on the pain scale.  She denies any fevers or chills. Past Medical History:  Diagnosis Date   Anemia    before    Blood transfusion without reported diagnosis    Breast cancer (HCC)    2015   Cancer (HCC)    Phreesia 07/02/2020   GERD (gastroesophageal reflux disease)    History of breast cancer 12/29/2014   Hives Mar 3rd 2016   Hyperlipidemia    Osteoporosis 08/18/2015   Scoliosis    Past Surgical History:  Procedure Laterality Date   BREAST RECONSTRUCTION Right 12/06/2014   Procedure: RIGHT NIPPLE AREOLAR RECONSTRUCTION WITH FULL THICKNESS SKIN GRAFT FROM RIGHT THIGH;  Surgeon: Glenna Fellows, MD;  Location: [facility];  Service: Plastics;  Laterality: Right;   BREAST REDUCTION SURGERY Left 12/06/2014   Procedure: LEFT BREAST REDUCTION FOR ASYMMETRY;  Surgeon: Glenna Fellows, MD;  Location: [facility];  Service: Plastics;  Laterality: Left;   BREAST SURGERY N/A    Phreesia 07/02/2020   CESAREAN SECTION  1996   CESAREAN SECTION N/A    Phreesia 07/02/2020   COLONOSCOPY WITH PROPOFOL N/A 08/23/2022   Procedure: COLONOSCOPY WITH PROPOFOL;  Surgeon: Lanelle Bal, DO;  Location: [facility];  Service: Endoscopy;  Laterality: N/A;  8:00am, asa 2   LATISSIMUS FLAP TO BREAST Right 07/27/2014   Procedure: TRAM FLAP RECONSTUCTION RIGHT CHEST;  Surgeon: Glenna Fellows, MD;  Location: [facility];  Service: Plastics;  Laterality: Right;   MASTECTOMY Right    MASTECTOMY W/ SENTINEL NODE BIOPSY Right 07/27/2014   Procedure: RIGHT MASTECTOMY WITH RIGHT AXILLARY SENTINEL LYMPH NODE BIOPSY;  Surgeon: Ovidio Kin, MD;  Location: [facility];  Service: General;  Laterality: Right;   OOPHORECTOMY  2016   PORT-A-CATH REMOVAL Left 12/06/2014   Procedure: REMOVAL PORT-A-CATH;  Surgeon: Glenna Fellows, MD;  Location: [facility];  Service: Plastics;  Laterality: Left;   PORTACATH PLACEMENT Left 09/05/2013   REDUCTION MAMMAPLASTY Left    WISDOM TOOTH EXTRACTION     Current Outpatient Medications on File Prior to Visit  Medication Sig Dispense Refill   anastrozole (ARIMIDEX) 1 MG tablet TAKE 1 TABLET(1 MG) BY MOUTH DAILY 30 tablet 5   Lactobacillus-Inulin (VIACTIV DIGESTIVE HEALTH) CHEW Chew 2 tablets by mouth daily. Soft chew     Probiotic Product (DIGESTIVE ADVANTAGE PO) Take 1 capsule by mouth daily.     simvastatin (ZOCOR) 20 MG tablet TAKE 1 TABLET(20 MG) BY MOUTH DAILY AT 6 PM 90 tablet 3   BIOTIN PO  Take 2 tablets by mouth daily. (Patient not taking: Reported on 10/17/2023)     ketoconazole (NIZORAL) 2 % cream Apply topically 2 (two) times daily. (Patient not taking: Reported on 10/17/2023)     triamcinolone cream (KENALOG) 0.1 % Apply 1 Application topically daily as needed (Rash). (Patient not taking: Reported on 10/17/2023)     No current facility-administered medications on file prior to visit.   Allergies  Allergen Reactions   Oxycodone Nausea Only   Social History   Socioeconomic History   Marital status: Married    Spouse name: Not on file   Number of children: 1   Years of education: Not on file   Highest education level: Not on file   Occupational History   Not on file  Tobacco Use   Smoking status: Never   Smokeless tobacco: Never  Vaping Use   Vaping status: Never Used  Substance and Sexual Activity   Alcohol use: No    Alcohol/week: 0.0 standard drinks of alcohol   Drug use: No   Sexual activity: Yes    Birth control/protection: None  Other Topics Concern   Not on file  Social History Narrative   Right Handed   Lives in a one story home    Drinks Caffeine Occasionally    Social Drivers of Corporate investment banker Strain: Not on file  Food Insecurity: Not on file  Transportation Needs: Not on file  Physical Activity: Not on file  Stress: Not on file  Social Connections: Not on file  Intimate Partner Violence: Not on file     Review of Systems  Musculoskeletal:  Positive for back pain.  All other systems reviewed and are negative.      Objective:   Physical Exam Vitals reviewed.  Constitutional:      General: She is in acute distress.     Appearance: Normal appearance. She is not ill-appearing or toxic-appearing.  Cardiovascular:     Heart sounds: Normal heart sounds. No murmur heard. Pulmonary:     Effort: Pulmonary effort is normal.     Breath sounds: Normal breath sounds.  Musculoskeletal:     Lumbar back: Deformity present. No spasms, tenderness or bony tenderness. Decreased range of motion.       Legs:  Neurological:     Mental Status: She is alert.           Assessment & Plan:  Acute left-sided low back pain with left-sided sciatica Patient is having intense sciatica and left low back pain.  She can take Dilaudid 2 mg every 6 hours as needed for pain and start prednisone 60 mg a day for 5 days straight due to the severity of the pain.  We discussed symptoms of cauda equina syndrome and when to go to the emergency room.  At the present time however there is no evidence of cauda equina syndrome.  Reassess next week or sooner if worsening.

## 2023-11-07 ENCOUNTER — Encounter (HOSPITAL_COMMUNITY): Payer: Self-pay

## 2023-11-07 ENCOUNTER — Ambulatory Visit (HOSPITAL_COMMUNITY)
Admission: RE | Admit: 2023-11-07 | Discharge: 2023-11-07 | Disposition: A | Discharge status: Home / Self Care | Payer: 59 | Source: Ambulatory Visit | Attending: Physician Assistant | Admitting: Physician Assistant

## 2023-11-07 DIAGNOSIS — Z170 Estrogen receptor positive status [ER+]: Secondary | ICD-10-CM | POA: Diagnosis present

## 2023-11-07 DIAGNOSIS — C50311 Malignant neoplasm of lower-inner quadrant of right female breast: Secondary | ICD-10-CM | POA: Insufficient documentation

## 2023-11-07 DIAGNOSIS — Z1231 Encounter for screening mammogram for malignant neoplasm of breast: Secondary | ICD-10-CM | POA: Insufficient documentation

## 2024-02-16 ENCOUNTER — Other Ambulatory Visit: Payer: Self-pay | Admitting: Hematology

## 2024-07-09 ENCOUNTER — Other Ambulatory Visit: Payer: 59

## 2024-07-09 DIAGNOSIS — Z0000 Encounter for general adult medical examination without abnormal findings: Secondary | ICD-10-CM

## 2024-07-12 ENCOUNTER — Ambulatory Visit: Payer: 59 | Admitting: Family Medicine

## 2024-07-12 ENCOUNTER — Encounter: Payer: Self-pay | Admitting: Family Medicine

## 2024-07-12 VITALS — BP 118/62 | HR 70 | Temp 98.7°F | Ht 67.0 in | Wt 189.4 lb

## 2024-07-12 DIAGNOSIS — Z23 Encounter for immunization: Secondary | ICD-10-CM

## 2024-07-12 DIAGNOSIS — Z0000 Encounter for general adult medical examination without abnormal findings: Secondary | ICD-10-CM

## 2024-07-12 DIAGNOSIS — E559 Vitamin D deficiency, unspecified: Secondary | ICD-10-CM

## 2024-07-12 DIAGNOSIS — E785 Hyperlipidemia, unspecified: Secondary | ICD-10-CM | POA: Diagnosis not present

## 2024-07-12 DIAGNOSIS — Z0001 Encounter for general adult medical examination with abnormal findings: Secondary | ICD-10-CM | POA: Diagnosis not present

## 2024-07-12 DIAGNOSIS — Z170 Estrogen receptor positive status [ER+]: Secondary | ICD-10-CM

## 2024-07-12 DIAGNOSIS — E663 Overweight: Secondary | ICD-10-CM

## 2024-07-12 DIAGNOSIS — C50311 Malignant neoplasm of lower-inner quadrant of right female breast: Secondary | ICD-10-CM

## 2024-07-12 NOTE — Assessment & Plan Note (Signed)
 BMI 29.66. Counseled on importance of weight management for overall health. Encouraged low calorie, heart healthy diet and moderate intensity exercise 150 minutes weekly. This is 3-5 times weekly for 30-50 minutes each session. Goal should be pace of 3 miles/hours, or walking 1.5 miles in 30 minutes and include strength training.

## 2024-07-12 NOTE — Assessment & Plan Note (Signed)
Continue vitamin d and calcium.

## 2024-07-12 NOTE — Assessment & Plan Note (Signed)
 Continue Simvastatin  20mg  daily. I recommend consuming a heart healthy diet such as Mediterranean diet or DASH diet with whole grains, fruits, vegetable, fish, lean meats, nuts, and olive oil. Limit sweets and processed foods. I also encourage moderate intensity exercise 150 minutes weekly. This is 3-5 times weekly for 30-50 minutes each session. Goal should be pace of 3 miles/hours, or walking 1.5 miles in 30 minutes. The 10-year ASCVD risk score (Arnett DK, et al., 2019) is: 1.5%

## 2024-07-12 NOTE — Addendum Note (Signed)
 Addended by: CORINNA JESUSA SAUNDERS on: 07/12/2024 09:23 AM   Modules accepted: Orders

## 2024-07-12 NOTE — Assessment & Plan Note (Signed)
 Followed by Oncology, on Anastrazole, annual mammograms without recurrence.   Right breast invasive ductal carcinoma status post mastectomy 12 2015 followed by reconstruction 2 cm tumor with intermediate grade DCIS, lymphovascular invasion noted, 2 SLN negative, grade 3, T1 C. N0 M0 stage IA

## 2024-07-12 NOTE — Progress Notes (Signed)
 Complete physical exam  Patient: Lydia Stevens   DOB: December 06, 1970   53 y.o. Female  MRN: [id number]  Subjective:    Chief Complaint  Patient presents with   Annual Exam    Flu vaccine today.    Lydia Stevens is a 53 y.o. female who presents today for a complete physical exam. She reports consuming a general diet. The patient does not participate in regular exercise at present. She generally feels well. She reports sleeping well. She does not have additional problems to discuss today.   PMH includes breast cancer, osteopenia, hyperlipidemia, GERD, and Vitamin D  deficiency.   History of breast cancer: on Anastrozole , s/p right mastectomy, followed by Oncology  Osteopenia: last DEXA 2024 was normal with T -0.8  HLD: on Simvastatin  20mg  daily Lipid Panel     Component Value Date/Time   CHOL 170 07/09/2024 0756   CHOL 156 03/04/2022 0840   TRIG 77 07/09/2024 0756   HDL 56 07/09/2024 0756   HDL 47 03/04/2022 0840   CHOLHDL 3.0 07/09/2024 0756   VLDL 19 08/17/2018 1336   LDLCALC 97 07/09/2024 0756   LABVLDL 16 03/04/2022 0840   GERD: diet controlled  Vitamin D  deficiency: taking daily with Calcium   Discussed the use of AI scribe software for clinical note transcription with the patient, who gave verbal consent to proceed.  History of Present Illness Lydia Stevens is a 53 year old female who presents for a routine follow-up visit.  She has a history of breast cancer and osteopenia. Her recent oncology visit revealed no new findings, and her mammogram results were normal. She continues to take anastrozole  and plans to discontinue it next month after completing ten years of therapy. She also takes simvastatin  for cholesterol management, with her LDL levels currently well-controlled below 100 mg/dL.  She takes vitamin D  with calcium , specifically two Viactiv calcium  chews daily, due to osteopenia. She reports a history of osteopenia, but her last DEXA scan in 2024  was normal.  Her acid reflux symptoms have resolved after she and her husband identified and eliminated a dietary trigger, specifically hamburgers with gravy and onions. She no longer experiences heartburn or related symptoms.  She does not engage in regular exercise, citing a busy work schedule and caregiving responsibilities for her mother as barriers. She is considering starting Tai Chi walking to incorporate physical activity into her routine.  She experiences hot flashes, which she attributes to menopause, but they do not significantly bother her.  She reports a history of numbness in her face when consuming hot tamales, which she has since avoided, leading to resolution of the symptoms.  No issues with vision, hearing, dental health, or other systemic symptoms such as chest pain, shortness of breath, or dizziness. No anxiety or depression, although she notes stress related to caregiving for her mother.     Most recent fall risk assessment:    07/12/2024    7:51 AM  Fall Risk   Falls in the past year? 0  Number falls in past yr: 0  Injury with Fall? 0  Risk for fall due to : No Fall Risks  Follow up Falls evaluation completed     Most recent depression screenings:    07/12/2024    7:51 AM 07/07/2023    3:14 PM  PHQ 2/9 Scores  PHQ - 2 Score 0 0  PHQ- 9 Score  0      Data saved with a previous flowsheet row definition  Vision:Not within last year  and Dental: No current dental problems and Receives regular dental care  Patient Active Problem List   Diagnosis Date Noted   Elevated low density lipoprotein (LDL) cholesterol level 07/07/2023   Eczema 03/01/2022   Hyperlipidemia 05/18/2018   Overweight (BMI 25.0-29.9) 05/02/2017   De Quervain's tenosynovitis 03/05/2017   Osteopenia 08/18/2015   Vitamin D  deficiency 08/05/2015   Scoliosis of lumbar spine 04/18/2015   Situational depression 01/17/2015   History of breast cancer 12/29/2014   Acquired absence of  breast and nipple 12/06/2014   Physical exam, annual 07/15/2014   Breast cancer of lower-inner quadrant of right female breast (HCC) 06/09/2014   Past Medical History:  Diagnosis Date   Anemia    before    Blood transfusion without reported diagnosis    Breast cancer (HCC)    2015   Cancer (HCC)    Phreesia 07/02/2020   GERD (gastroesophageal reflux disease)    History of breast cancer 12/29/2014   Hives Mar 3rd 2016   Hyperlipidemia    Osteoporosis 08/18/2015   Personal history of chemotherapy 2015   Scoliosis    Past Surgical History:  Procedure Laterality Date   BREAST BIOPSY Left 06/07/2014   fibroadenoma   BREAST BIOPSY Right 06/07/2014   INVASIVE DUCTAL CARCINOMA. - DUCTAL CARCINOMA IN SITU.   BREAST RECONSTRUCTION Right 12/06/2014   Procedure: RIGHT NIPPLE AREOLAR RECONSTRUCTION WITH FULL THICKNESS SKIN GRAFT FROM RIGHT THIGH;  Surgeon: Earlis Ranks, MD;  Location: [facility];  Service: Plastics;  Laterality: Right;   BREAST REDUCTION SURGERY Left 12/06/2014   Procedure: LEFT BREAST REDUCTION FOR ASYMMETRY;  Surgeon: Earlis Ranks, MD;  Location: [facility];  Service: Plastics;  Laterality: Left;   BREAST SURGERY N/A    Phreesia 07/02/2020   CESAREAN SECTION  1996   CESAREAN SECTION N/A    Phreesia 07/02/2020   COLONOSCOPY WITH PROPOFOL  N/A 08/23/2022   Procedure: COLONOSCOPY WITH PROPOFOL ;  Surgeon: Cindie Carlin POUR, DO;  Location: [facility];  Service: Endoscopy;  Laterality: N/A;  8:00am, asa 2   LATISSIMUS FLAP TO BREAST Right 07/27/2014   Procedure: TRAM FLAP RECONSTUCTION RIGHT CHEST;  Surgeon: Earlis Ranks, MD;  Location: [facility];  Service: Plastics;  Laterality: Right;   MASTECTOMY Right    MASTECTOMY W/ SENTINEL NODE BIOPSY Right 07/27/2014   Procedure: RIGHT MASTECTOMY WITH RIGHT AXILLARY SENTINEL LYMPH NODE BIOPSY;  Surgeon: Alm Angle, MD;  Location: [facility];  Service: General;  Laterality: Right;   OOPHORECTOMY   2016   PORT-A-CATH REMOVAL Left 12/06/2014   Procedure: REMOVAL PORT-A-CATH;  Surgeon: Earlis Ranks, MD;  Location: [facility];  Service: Plastics;  Laterality: Left;   PORTACATH PLACEMENT Left 09/05/2013   REDUCTION MAMMAPLASTY Left    WISDOM TOOTH EXTRACTION     Social History   Tobacco Use   Smoking status: Never   Smokeless tobacco: Never  Vaping Use   Vaping status: Never Used  Substance Use Topics   Alcohol use: No    Alcohol/week: 0.0 standard drinks of alcohol   Drug use: No   Family History  Problem Relation Age of Onset   Heart attack Father    Arthritis Father    Heart disease Father        Massive MI   Breast cancer Maternal Aunt 50   Breast cancer Sister 8   Cancer Sister    Breast cancer Maternal Aunt 31   Arthritis Mother  Hypertension Mother    Asthma Sister    Alcohol abuse Brother    Hypertension Brother    Cancer Sister        1/2 - breast cancer   Breast cancer Paternal Aunt 81   Allergies  Allergen Reactions   Oxycodone  Nausea Only    Patient Care Team: Kayla Jeoffrey RAMAN, FNP as PCP - General (Family Medicine) Ethyl Lenis, MD as Consulting Physician (General Surgery) Shannon Agent, MD as Consulting Physician (Radiation Oncology) Patel, Donika K, DO as Consulting Physician (Neurology)   Outpatient Medications Prior to Visit  Medication Sig   anastrozole  (ARIMIDEX ) 1 MG tablet TAKE 1 TABLET(1 MG) BY MOUTH DAILY   Lactobacillus-Inulin (VIACTIV DIGESTIVE HEALTH) CHEW Chew 2 tablets by mouth daily. Soft chew   Probiotic Product (DIGESTIVE ADVANTAGE PO) Take 1 capsule by mouth daily.   simvastatin  (ZOCOR ) 20 MG tablet TAKE 1 TABLET(20 MG) BY MOUTH DAILY AT 6 PM   [DISCONTINUED] BIOTIN PO Take 2 tablets by mouth daily. (Patient not taking: Reported on 10/17/2023)   [DISCONTINUED] HYDROmorphone  (DILAUDID ) 2 MG tablet Take 1 tablet (2 mg total) by mouth every 4 (four) hours as needed for severe pain (pain score 7-10). (Patient  not taking: Reported on 07/12/2024)   [DISCONTINUED] ketoconazole (NIZORAL) 2 % cream Apply topically 2 (two) times daily. (Patient not taking: Reported on 10/17/2023)   [DISCONTINUED] predniSONE  (DELTASONE ) 20 MG tablet Take 3 tablets (60 mg total) by mouth daily with breakfast. (Patient not taking: Reported on 07/12/2024)   [DISCONTINUED] triamcinolone  cream (KENALOG ) 0.1 % Apply 1 Application topically daily as needed (Rash). (Patient not taking: Reported on 07/12/2024)   No facility-administered medications prior to visit.    Review of Systems  Constitutional: Negative.   HENT: Negative.    Eyes: Negative.   Respiratory: Negative.    Cardiovascular: Negative.   Gastrointestinal: Negative.   Genitourinary: Negative.   Musculoskeletal: Negative.   Skin: Negative.   Neurological: Negative.   Endo/Heme/Allergies: Negative.   Psychiatric/Behavioral: Negative.    All other systems reviewed and are negative.         Objective:     BP 118/62   Pulse 70   Temp 98.7 F (37.1 C)   Ht 5' 7 (1.702 m)   Wt 189 lb 6.4 oz (85.9 kg)   SpO2 99%   BMI 29.66 kg/m  BP Readings from Last 3 Encounters:  07/12/24 118/62  10/17/23 130/72  08/06/23 104/77   Wt Readings from Last 3 Encounters:  07/12/24 189 lb 6.4 oz (85.9 kg)  10/17/23 188 lb (85.3 kg)  08/06/23 188 lb 11.4 oz (85.6 kg)      Physical Exam Vitals and nursing note reviewed.  Constitutional:      Appearance: Normal appearance. She is normal weight.  HENT:     Head: Normocephalic and atraumatic.     Right Ear: Tympanic membrane, ear canal and external ear normal.     Left Ear: Tympanic membrane, ear canal and external ear normal.     Nose: Nose normal.     Mouth/Throat:     Mouth: Mucous membranes are moist.     Pharynx: Oropharynx is clear.  Eyes:     Extraocular Movements: Extraocular movements intact.     Conjunctiva/sclera: Conjunctivae normal.     Pupils: Pupils are equal, round, and reactive to light.   Cardiovascular:     Rate and Rhythm: Normal rate and regular rhythm.     Pulses: Normal pulses.  Heart sounds: Normal heart sounds.  Pulmonary:     Effort: Pulmonary effort is normal.     Breath sounds: Normal breath sounds.  Abdominal:     General: Bowel sounds are normal.     Palpations: Abdomen is soft.  Musculoskeletal:        General: Normal range of motion.     Cervical back: Normal range of motion and neck supple.  Skin:    General: Skin is warm and dry.     Capillary Refill: Capillary refill takes less than 2 seconds.  Neurological:     General: No focal deficit present.     Mental Status: She is alert and oriented to person, place, and time. Mental status is at baseline.  Psychiatric:        Mood and Affect: Mood normal.        Behavior: Behavior normal.        Thought Content: Thought content normal.        Judgment: Judgment normal.      No results found for any visits on 07/12/24. Last CBC Lab Results  Component Value Date   WBC 7.4 07/09/2024   HGB 12.7 07/09/2024   HCT 39.4 07/09/2024   MCV 85.8 07/09/2024   MCH 27.7 07/09/2024   RDW 14.1 07/09/2024   PLT 256 07/09/2024   Last metabolic panel Lab Results  Component Value Date   GLUCOSE 85 07/09/2024   NA 140 07/09/2024   K 4.5 07/09/2024   CL 104 07/09/2024   CO2 28 07/09/2024   BUN 12 07/09/2024   CREATININE 0.96 07/09/2024   EGFR 71 07/09/2024   CALCIUM  9.9 07/09/2024   PROT 7.0 07/09/2024   ALBUMIN 4.1 08/01/2023   BILITOT 0.6 07/09/2024   ALKPHOS 90 08/01/2023   AST 10 07/09/2024   ALT 8 07/09/2024   ANIONGAP 10 08/01/2023   Last lipids Lab Results  Component Value Date   CHOL 170 07/09/2024   HDL 56 07/09/2024   LDLCALC 97 07/09/2024   TRIG 77 07/09/2024   CHOLHDL 3.0 07/09/2024   Last hemoglobin A1c Lab Results  Component Value Date   HGBA1C 5.5 07/05/2020   Last thyroid functions Lab Results  Component Value Date   TSH 1.87 07/09/2024   Last vitamin D  Lab Results   Component Value Date   VD25OH 63.10 08/01/2023   Last vitamin B12 and Folate Lab Results  Component Value Date   VITAMINB12 512 07/05/2020        Assessment & Plan:    Routine Health Maintenance and Physical Exam  Immunization History  Administered Date(s) Administered   Influenza,inj,Quad PF,6+ Mos 06/19/2022   Influenza-Unspecified 06/20/2021   MMR 06/07/1998   Moderna SARS-COV2 Booster Vaccination 06/20/2021   PFIZER(Purple Top)SARS-COV-2 Vaccination 11/08/2019, 11/30/2019   Td 12/08/2001   Tdap 01/17/2015   Zoster Recombinant(Shingrix ) 03/01/2022, 06/19/2022    Health Maintenance  Topic Date Due   Pneumococcal Vaccine: 50+ Years (1 of 1 - PCV) Never done   Influenza Vaccine  03/26/2024   COVID-19 Vaccine (3 - Pfizer risk series) 07/28/2024 (Originally 07/18/2021)   Hepatitis B Vaccines 19-59 Average Risk (1 of 3 - 19+ 3-dose series) 07/12/2025 (Originally 01/25/1991)   Mammogram  11/06/2024   DTaP/Tdap/Td (3 - Td or Tdap) 01/16/2025   Cervical Cancer Screening (HPV/Pap Cotest)  08/10/2026   Colonoscopy  08/23/2032   Hepatitis C Screening  Completed   HIV Screening  Completed   Zoster Vaccines- Shingrix   Completed   HPV VACCINES  Aged Out   Meningococcal B Vaccine  Aged Out    Discussed health benefits of physical activity, and encouraged her to engage in regular exercise appropriate for her age and condition.  Problem List Items Addressed This Visit     Breast cancer of lower-inner quadrant of right female breast (HCC)   Followed by Oncology, on Anastrazole, annual mammograms without recurrence.   Right breast invasive ductal carcinoma status post mastectomy 12 2015 followed by reconstruction 2 cm tumor with intermediate grade DCIS, lymphovascular invasion noted, 2 SLN negative, grade 3, T1 C. N0 M0 stage IA       Physical exam, annual - Primary   Today your medical history was reviewed and routine physical exam with labs was performed. Recommend 150 minutes  of moderate intensity exercise weekly and consuming a well-balanced diet. Advised to stop smoking if a smoker, avoid smoking if a non-smoker, limit alcohol consumption to 1 drink per day for women and 2 drinks per day for men, and avoid illicit drug use. Vaccine maintenance discussed. Appropriate health maintenance items reviewed. Return to office in 1 year for annual physical exam.       Vitamin D  deficiency   Continue vitamin d  and calcium       Overweight (BMI 25.0-29.9)   BMI 29.66. Counseled on importance of weight management for overall health. Encouraged low calorie, heart healthy diet and moderate intensity exercise 150 minutes weekly. This is 3-5 times weekly for 30-50 minutes each session. Goal should be pace of 3 miles/hours, or walking 1.5 miles in 30 minutes and include strength training.       Hyperlipidemia   Continue Simvastatin  20mg  daily. I recommend consuming a heart healthy diet such as Mediterranean diet or DASH diet with whole grains, fruits, vegetable, fish, lean meats, nuts, and olive oil. Limit sweets and processed foods. I also encourage moderate intensity exercise 150 minutes weekly. This is 3-5 times weekly for 30-50 minutes each session. Goal should be pace of 3 miles/hours, or walking 1.5 miles in 30 minutes. The 10-year ASCVD risk score (Arnett DK, et al., 2019) is: 1.5%       Other Visit Diagnoses       Need for hepatitis vaccination          Return in about 1 year (around 07/12/2025) for annual physical with labs 1 week prior.     Jeoffrey GORMAN Barrio, FNP

## 2024-07-12 NOTE — Assessment & Plan Note (Signed)
Today your medical history was reviewed and routine physical exam with labs was performed. Recommend 150 minutes of moderate intensity exercise weekly and consuming a well-balanced diet. Advised to stop smoking if a smoker, avoid smoking if a non-smoker, limit alcohol consumption to 1 drink per day for women and 2 drinks per day for men, and avoid illicit drug use. Vaccine maintenance discussed. Appropriate health maintenance items reviewed. Return to office in 1 year for annual physical exam.

## 2024-07-13 LAB — COMPREHENSIVE METABOLIC PANEL WITH GFR
AG Ratio: 1.5 (calc) (ref 1.0–2.5)
ALT: 8 U/L (ref 6–29)
AST: 10 U/L (ref 10–35)
Albumin: 4.2 g/dL (ref 3.6–5.1)
Alkaline phosphatase (APISO): 111 U/L (ref 37–153)
BUN: 12 mg/dL (ref 7–25)
CO2: 28 mmol/L (ref 20–32)
Calcium: 9.9 mg/dL (ref 8.6–10.4)
Chloride: 104 mmol/L (ref 98–110)
Creat: 0.96 mg/dL (ref 0.50–1.03)
Globulin: 2.8 g/dL (ref 1.9–3.7)
Glucose, Bld: 85 mg/dL (ref 65–99)
Potassium: 4.5 mmol/L (ref 3.5–5.3)
Sodium: 140 mmol/L (ref 135–146)
Total Bilirubin: 0.6 mg/dL (ref 0.2–1.2)
Total Protein: 7 g/dL (ref 6.1–8.1)
eGFR: 71 mL/min/1.73m2 (ref >=60)

## 2024-07-13 LAB — CBC WITH DIFFERENTIAL/PLATELET
Absolute Lymphocytes: 1968 {cells}/uL (ref 850–3900)
Absolute Monocytes: 429 {cells}/uL (ref 200–950)
Basophils Absolute: 37 {cells}/uL (ref 0–200)
Basophils Relative: 0.5 %
Eosinophils Absolute: 118 {cells}/uL (ref 15–500)
Eosinophils Relative: 1.6 %
HCT: 39.4 % (ref 35.0–45.0)
Hemoglobin: 12.7 g/dL (ref 11.7–15.5)
MCH: 27.7 pg (ref 27.0–33.0)
MCHC: 32.2 g/dL (ref 32.0–36.0)
MCV: 85.8 fL (ref 80.0–100.0)
MPV: 10.3 fL (ref 7.5–12.5)
Monocytes Relative: 5.8 %
Neutro Abs: 4847 {cells}/uL (ref 1500–7800)
Neutrophils Relative %: 65.5 %
Platelets: 256 Thousand/uL (ref 140–400)
RBC: 4.59 Million/uL (ref 3.80–5.10)
RDW: 14.1 % (ref 11.0–15.0)
Total Lymphocyte: 26.6 %
WBC: 7.4 Thousand/uL (ref 3.8–10.8)

## 2024-07-13 LAB — LIPID PANEL
Cholesterol: 170 mg/dL (ref <200)
HDL: 56 mg/dL (ref >=50)
LDL Cholesterol (Calc): 97 mg/dL
Non-HDL Cholesterol (Calc): 114 mg/dL (ref <130)
Total CHOL/HDL Ratio: 3 (calc) (ref <5.0)
Triglycerides: 77 mg/dL (ref <150)

## 2024-07-13 LAB — TEST AUTHORIZATION

## 2024-07-13 LAB — VITAMIN D 25 HYDROXY (VIT D DEFICIENCY, FRACTURES): Vit D, 25-Hydroxy: 60 ng/mL (ref 30–100)

## 2024-07-13 LAB — TSH: TSH: 1.87 m[IU]/L

## 2024-07-14 ENCOUNTER — Ambulatory Visit: Payer: Self-pay | Admitting: Family Medicine

## 2024-08-04 ENCOUNTER — Inpatient Hospital Stay: Payer: 59 | Attending: Physician Assistant

## 2024-08-04 DIAGNOSIS — Z9221 Personal history of antineoplastic chemotherapy: Secondary | ICD-10-CM | POA: Diagnosis not present

## 2024-08-04 DIAGNOSIS — C50311 Malignant neoplasm of lower-inner quadrant of right female breast: Secondary | ICD-10-CM

## 2024-08-04 DIAGNOSIS — M810 Age-related osteoporosis without current pathological fracture: Secondary | ICD-10-CM | POA: Insufficient documentation

## 2024-08-04 DIAGNOSIS — Z17411 Hormone receptor positive with human epidermal growth factor receptor 2 negative status: Secondary | ICD-10-CM | POA: Diagnosis not present

## 2024-08-04 DIAGNOSIS — Z79899 Other long term (current) drug therapy: Secondary | ICD-10-CM | POA: Diagnosis not present

## 2024-08-04 DIAGNOSIS — Z170 Estrogen receptor positive status [ER+]: Secondary | ICD-10-CM | POA: Diagnosis not present

## 2024-08-04 DIAGNOSIS — Z79811 Long term (current) use of aromatase inhibitors: Secondary | ICD-10-CM | POA: Insufficient documentation

## 2024-08-04 DIAGNOSIS — Z1721 Progesterone receptor positive status: Secondary | ICD-10-CM | POA: Diagnosis not present

## 2024-08-04 DIAGNOSIS — Z9011 Acquired absence of right breast and nipple: Secondary | ICD-10-CM | POA: Diagnosis not present

## 2024-08-04 DIAGNOSIS — E559 Vitamin D deficiency, unspecified: Secondary | ICD-10-CM

## 2024-08-04 DIAGNOSIS — Z1231 Encounter for screening mammogram for malignant neoplasm of breast: Secondary | ICD-10-CM

## 2024-08-04 LAB — COMPREHENSIVE METABOLIC PANEL WITH GFR
ALT: 9 U/L (ref 0–44)
AST: 16 U/L (ref 15–41)
Albumin: 4.1 g/dL (ref 3.5–5.0)
Alkaline Phosphatase: 123 U/L (ref 38–126)
Anion gap: 11 (ref 5–15)
BUN: 12 mg/dL (ref 6–20)
CO2: 25 mmol/L (ref 22–32)
Calcium: 9.5 mg/dL (ref 8.9–10.3)
Chloride: 105 mmol/L (ref 98–111)
Creatinine, Ser: 0.88 mg/dL (ref 0.44–1.00)
GFR, Estimated: >60 mL/min (ref >60)
Glucose, Bld: 90 mg/dL (ref 70–99)
Potassium: 4.2 mmol/L (ref 3.5–5.1)
Sodium: 141 mmol/L (ref 135–145)
Total Bilirubin: 0.2 mg/dL (ref 0.0–1.2)
Total Protein: 7.4 g/dL (ref 6.5–8.1)

## 2024-08-04 LAB — CBC WITH DIFFERENTIAL/PLATELET
Abs Immature Granulocytes: 0.03 K/uL (ref 0.00–0.07)
Basophils Absolute: 0.1 K/uL (ref 0.0–0.1)
Basophils Relative: 1 %
Eosinophils Absolute: 0.2 K/uL (ref 0.0–0.5)
Eosinophils Relative: 3 %
HCT: 37.4 % (ref 36.0–46.0)
Hemoglobin: 11.9 g/dL — ABNORMAL LOW (ref 12.0–15.0)
Immature Granulocytes: 0 %
Lymphocytes Relative: 25 %
Lymphs Abs: 1.9 K/uL (ref 0.7–4.0)
MCH: 27.8 pg (ref 26.0–34.0)
MCHC: 31.8 g/dL (ref 30.0–36.0)
MCV: 87.4 fL (ref 80.0–100.0)
Monocytes Absolute: 0.5 K/uL (ref 0.1–1.0)
Monocytes Relative: 6 %
Neutro Abs: 5.1 K/uL (ref 1.7–7.7)
Neutrophils Relative %: 65 %
Platelets: 349 K/uL (ref 150–400)
RBC: 4.28 MIL/uL (ref 3.87–5.11)
RDW: 14.7 % (ref 11.5–15.5)
WBC: 7.8 K/uL (ref 4.0–10.5)
nRBC: 0 % (ref 0.0–0.2)

## 2024-08-04 LAB — VITAMIN D 25 HYDROXY (VIT D DEFICIENCY, FRACTURES): Vit D, 25-Hydroxy: 78.1 ng/mL (ref 30–100)

## 2024-08-10 NOTE — Progress Notes (Unsigned)
 [facility] [address], [place] [address]   CLINIC:  Medical Oncology/Hematology  PCP:  Kayla Jeoffrey RAMAN, FNP [address] [place] [address] [phone]   REASON FOR VISIT:  Follow-up for right-sided breast cancer, s/p right mastectomy  PRIOR THERAPY: - Right mastectomy (07/27/2014) - Chemotherapy with Taxotere /Cytoxan  followed by Zoladex   NGS Results: Not done  CURRENT THERAPY: Anastrozole   BRIEF ONCOLOGIC HISTORY:   Oncology History  Breast cancer of lower-inner quadrant of right female breast (HCC)  06/07/2014 Initial Biopsy   Right breast needle biopsy 5:00 position: Invasive ductal carcinoma with DCIS, ER 100%, PR 71%, Ki-67 33%, HER-2 negative ratio 1.03   06/17/2014 Breast MRI   Right breast: 10 x 7 x 5 mm biopsy-proven IDC with DCIS, left breast 7 x 7 x 7 mm fibroadenoma, left upper quadrant of the abdomen abutting the peritoneum 3 x 1.1 cm oval soft tissue mass   07/27/2014 Surgery   Right breast mastectomy: Invasive ductal carcinoma grade 3, 2 cm, intermediate grade DCIS, lymphovascular invasion identified, 2 SLN negative, T1 C. N0 M0 stage IA, ER positive, PR 7%, HER-2 negative, Ki-67 33%   07/27/2014 Oncotype testing   Recurrence Score of 24, placing patient in the intermediate risk group   09/06/2014 -  Chemotherapy   Taxotere /Cytoxan     09/08/2014 Adverse Reaction   Nasuea and voming three times x 2 days.  Added Aloxi  and Emend to anti-emetic regimen.   10/18/2014 -  Chemotherapy   Zoladex    10/27/2014 Adverse Reaction   Hives, secondary to Zoladex ?   02/16/2015 Surgery   Laparoscopic BSO with Dr. Dodie     CANCER STAGING:  Cancer Staging  Breast cancer of lower-inner quadrant of right female breast [facility]) Staging form: Breast, AJCC 7th Edition - Clinical: Stage IA (T1b, N0, cM0) - Unsigned - Pathologic: Stage IA (T1c, N0(sn), cM0) - Signed by Guinevere Fonda NOVAK, MD on 09/13/2014   INTERVAL HISTORY:   Ms. Lydia Stevens, a 53 y.o. female, returns for routine follow-up of her history of right-sided breast cancer. Bralee was last seen on 08/06/2023 by Pleasant Barefoot PA-C.   At today's visit, she  reports feeling well. She denies any recent hospitalizations, surgeries, or changes in her  baseline health status. She reports 100% energy and 100% appetite.   She is maintaining stable weight at this time.  She denies any new breast lumps or axillary lymphadenopathy. No new onset aches or pains, headaches, abdominal pain. She continues to take Arimidex , which she is tolerating fairly well apart from some hot flashes and vaginal dryness.   She continues to take calcium  and vitamin D  and engage in frequent weightbearing exercises.  ASSESSMENT & PLAN:  1.  Stage I (PT1CPN0) right breast IDC: - Status post right mastectomy followed by TRAM flap in December 2015, 2 cm IDC, grade 3, 2 sentinel lymph node negative, ER/PR positive, HER-2 negative, Ki-67 33%. - Oncotype DX recurrence score of 24. - 4 cycles of TC from 09/06/2014 through 11/08/2014. - Anastrozole  was started around April 2016.  Anastrozole  being continued x 10 years (through April 2026) - Most recent unilateral LEFT breast mammogram (11/07/2023) BI-RADS Category 1, negative - Physical exam today (08/11/2024): Negative for any suspicious nodules at the reconstructed right breast.  No palpable adenopathy.  No mass in left breast.   - Most recent labs (08/04/2024) with normal CBC, normal CMP/LFTs - History/ROS negative for any red flag symptoms of recurrence  -  PLAN: Schedule for unilateral LEFT breast screening mammogram in March 2026 - She will discontinue anastrozole  in April 2026. - Labs and RTC for office visit in 6 months.  If stable at that time, we will discharge to PCP.    2.   Bone health: - DEXA scan on 07/29/2017 with T score -0.8. - She was started on Prolia  every 6 months on 08/18/2015.  Last dose was on 02/17/2019.  Prolia  was  discontinued secondary to high co-pays. - Most recent bone density scan (11/06/2022): T-score stable and normal at -0.8 - Vitamin D  (08/04/2024) normal at 78.10.  Normal calcium  9.5. - PLAN: Continue calcium , vitamin D , and weightbearing exercises - Repeat bone density scan in March 2026.  After discontinuation of anastrozole , ongoing monitoring of bone density will be deferred to PCP.  PLAN SUMMARY: >> Unilateral LEFT breast mammogram due around 11/07/2024 >> Bone density/DEXA due around 11/06/2024 >> Labs in 6 months = CBC/D, CMP, vitamin D  >> OFFICE visit in 6 months  (1 week after labs)    REVIEW OF SYSTEMS:   Review of Systems  Constitutional:  Negative for appetite change, chills, diaphoresis, fatigue, fever and unexpected weight change.  HENT:   Negative for lump/mass and nosebleeds.   Eyes:  Negative for eye problems.  Respiratory:  Negative for cough, hemoptysis and shortness of breath.   Cardiovascular:  Negative for chest pain, leg swelling and palpitations.  Gastrointestinal:  Negative for abdominal pain, blood in stool, constipation, diarrhea, nausea and vomiting.  Genitourinary:  Negative for hematuria.   Musculoskeletal:  Negative for arthralgias.  Skin: Negative.   Neurological:  Positive for numbness. Negative for dizziness, headaches and light-headedness.  Hematological:  Does not bruise/bleed easily.  Psychiatric/Behavioral:  Positive for sleep disturbance.     PHYSICAL EXAM:   Performance status (ECOG): 0 - Asymptomatic  Vitals:   08/11/24 0804  BP: 106/72  Pulse: 82  Resp: 16  Temp: 99 F (37.2 C)  SpO2: 100%    Wt Readings from Last 3 Encounters:  08/11/24 190 lb 4.1 oz (86.3 kg)  07/12/24 189 lb 6.4 oz (85.9 kg)  10/17/23 188 lb (85.3 kg)   Physical Exam Constitutional:      Appearance: Normal appearance. She is normal weight.  Cardiovascular:     Heart sounds: Normal heart sounds.  Pulmonary:     Breath sounds: Normal breath sounds.  Chest:      Comments:  Negative for any suspicious nodules at the reconstructed right breast.  No palpable adenopathy.  No mass in left breast.   Lymphadenopathy:     Upper Body:     Right upper body: No supraclavicular, axillary or pectoral adenopathy.     Left upper body: No supraclavicular, axillary or pectoral adenopathy.  Neurological:     General: No focal deficit present.     Mental Status: Mental status is at baseline.  Psychiatric:        Behavior: Behavior normal. Behavior is cooperative.      PAST MEDICAL/SURGICAL HISTORY:  Past Medical History:  Diagnosis Date   Anemia    before    Blood transfusion without reported diagnosis    Breast cancer (HCC)    2015   Cancer (HCC)    Phreesia 07/02/2020   GERD (gastroesophageal reflux disease)    History of breast cancer 12/29/2014   Hives Mar 3rd 2016   Hyperlipidemia    Osteoporosis 08/18/2015   Personal history of chemotherapy 2015   Scoliosis  Past Surgical History:  Procedure Laterality Date   BREAST BIOPSY Left 06/07/2014   fibroadenoma   BREAST BIOPSY Right 06/07/2014   INVASIVE DUCTAL CARCINOMA. - DUCTAL CARCINOMA IN SITU.   BREAST RECONSTRUCTION Right 12/06/2014   Procedure: RIGHT NIPPLE AREOLAR RECONSTRUCTION WITH FULL THICKNESS SKIN GRAFT FROM RIGHT THIGH;  Surgeon: Earlis Ranks, MD;  Location: [facility];  Service: Plastics;  Laterality: Right;   BREAST REDUCTION SURGERY Left 12/06/2014   Procedure: LEFT BREAST REDUCTION FOR ASYMMETRY;  Surgeon: Earlis Ranks, MD;  Location: [facility];  Service: Plastics;  Laterality: Left;   BREAST SURGERY N/A    Phreesia 07/02/2020   CESAREAN SECTION  1996   CESAREAN SECTION N/A    Phreesia 07/02/2020   COLONOSCOPY WITH PROPOFOL  N/A 08/23/2022   Procedure: COLONOSCOPY WITH PROPOFOL ;  Surgeon: Cindie Carlin POUR, DO;  [facility];  Service: Endoscopy;  Laterality: N/A;  8:00am, asa 2   LATISSIMUS FLAP TO BREAST Right 07/27/2014    Procedure: TRAM FLAP RECONSTUCTION RIGHT CHEST;  Surgeon: Earlis Ranks, MD;  Location: [facility];  Service: Plastics;  Laterality: Right;   MASTECTOMY Right    MASTECTOMY W/ SENTINEL NODE BIOPSY Right 07/27/2014   Procedure: RIGHT MASTECTOMY WITH RIGHT AXILLARY SENTINEL LYMPH NODE BIOPSY;  Surgeon: Alm Angle, MD;  Location: [facility];  Service: General;  Laterality: Right;   OOPHORECTOMY  2016   PORT-A-CATH REMOVAL Left 12/06/2014   Procedure: REMOVAL PORT-A-CATH;  Surgeon: Earlis Ranks, MD;  Location: [facility];  Service: Plastics;  Laterality: Left;   PORTACATH PLACEMENT Left 09/05/2013   REDUCTION MAMMAPLASTY Left    WISDOM TOOTH EXTRACTION      SOCIAL HISTORY:  Social History   Socioeconomic History   Marital status: Married    Spouse name: Not on file   Number of children: 1   Years of education: Not on file   Highest education level: Not on file  Occupational History   Not on file  Tobacco Use   Smoking status: Never   Smokeless tobacco: Never  Vaping Use   Vaping status: Never Used  Substance and Sexual Activity   Alcohol use: No    Alcohol/week: 0.0 standard drinks of alcohol   Drug use: No   Sexual activity: Yes    Birth control/protection: None  Other Topics Concern   Not on file  Social History Narrative   Right Handed   Lives in a one story home    Drinks Caffeine Occasionally    Social Drivers of Health   Tobacco Use: Low Risk (07/12/2024)   Patient History    Smoking Tobacco Use: Never    Smokeless Tobacco Use: Never    Passive Exposure: Not on file  Financial Resource Strain: Not on file  Food Insecurity: Not on file  Transportation Needs: Not on file  Physical Activity: Not on file  Stress: Not on file  Social Connections: Not on file  Intimate Partner Violence: Not on file  Depression (PHQ2-9): Low Risk (08/11/2024)   Depression (PHQ2-9)    PHQ-2 Score: 0  Alcohol Screen: Not on file  Housing: Not on file  Utilities:  Not on file  Health Literacy: Not on file    FAMILY HISTORY:  Family History  Problem Relation Age of Onset   Heart attack Father    Arthritis Father    Heart disease Father        Massive MI   Breast cancer Maternal Aunt 50  Breast cancer Sister 18   Cancer Sister    Breast cancer Maternal Aunt 54   Arthritis Mother    Hypertension Mother    Asthma Sister    Alcohol abuse Brother    Hypertension Brother    Cancer Sister        1/2 - breast cancer   Breast cancer Paternal Aunt 27    CURRENT MEDICATIONS:  Current Outpatient Medications  Medication Sig Dispense Refill   anastrozole  (ARIMIDEX ) 1 MG tablet TAKE 1 TABLET(1 MG) BY MOUTH DAILY 90 tablet 1   Lactobacillus-Inulin (VIACTIV DIGESTIVE HEALTH) CHEW Chew 2 tablets by mouth daily. Soft chew     Probiotic Product (DIGESTIVE ADVANTAGE PO) Take 1 capsule by mouth daily.     simvastatin  (ZOCOR ) 20 MG tablet TAKE 1 TABLET(20 MG) BY MOUTH DAILY AT 6 PM 90 tablet 3   No current facility-administered medications for this visit.    ALLERGIES:  Allergies  Allergen Reactions   Oxycodone  Nausea Only    LABORATORY DATA:  I have reviewed the labs as listed.     Latest Ref Rng & Units 08/04/2024    7:49 AM 07/09/2024    7:56 AM 08/01/2023   10:29 AM  CBC  WBC 4.0 - 10.5 K/uL 7.8  7.4  8.3   Hemoglobin 12.0 - 15.0 g/dL 88.0  87.2  87.2   Hematocrit 36.0 - 46.0 % 37.4  39.4  38.6   Platelets 150 - 400 K/uL 349  256  281       Latest Ref Rng & Units 08/04/2024    7:49 AM 07/09/2024    7:56 AM 08/01/2023   10:29 AM  CMP  Glucose 70 - 99 mg/dL 90  85  99   BUN 6 - 20 mg/dL 12  12  12    Creatinine 0.44 - 1.00 mg/dL 9.11  9.03  9.09   Sodium 135 - 145 mmol/L 141  140  138   Potassium 3.5 - 5.1 mmol/L 4.2  4.5  3.9   Chloride 98 - 111 mmol/L 105  104  102   CO2 22 - 32 mmol/L 25  28  26    Calcium  8.9 - 10.3 mg/dL 9.5  9.9  9.8   Total Protein 6.5 - 8.1 g/dL 7.4  7.0  7.9   Total Bilirubin 0.0 - 1.2 mg/dL 0.2  0.6  0.6    Alkaline Phos 38 - 126 U/L 123   90   AST 15 - 41 U/L 16  10  15    ALT 0 - 44 U/L 9  8  15      DIAGNOSTIC IMAGING:  I have independently reviewed the scans and discussed with the patient. No results found.   WRAP UP:  All questions were answered. The patient knows to call the clinic with any problems, questions or concerns.  Medical decision making: Moderate  Time spent on visit: I spent 20 minutes counseling the patient face to face. The total time spent in the appointment was 30 minutes and more than 50% was on counseling.  Pleasant CHRISTELLA Barefoot, PA-C  08/11/2024 8:46 AM

## 2024-08-11 ENCOUNTER — Inpatient Hospital Stay: Payer: 59 | Admitting: Physician Assistant

## 2024-08-11 VITALS — BP 106/72 | HR 82 | Temp 99.0°F | Resp 16 | Wt 190.3 lb

## 2024-08-11 DIAGNOSIS — C50311 Malignant neoplasm of lower-inner quadrant of right female breast: Secondary | ICD-10-CM

## 2024-08-11 DIAGNOSIS — E559 Vitamin D deficiency, unspecified: Secondary | ICD-10-CM

## 2024-08-11 DIAGNOSIS — Z170 Estrogen receptor positive status [ER+]: Secondary | ICD-10-CM

## 2024-08-11 DIAGNOSIS — Z1231 Encounter for screening mammogram for malignant neoplasm of breast: Secondary | ICD-10-CM

## 2024-08-11 NOTE — Patient Instructions (Signed)
 [facility] at [facility] **VISIT SUMMARY & IMPORTANT INSTRUCTIONS **   You were seen today by Pleasant Barefoot PA-C for your history of breast cancer.    HISTORY OF BREAST CANCER Your most recent labs, mammogram, and physical exam did not show any evidence of recurrent breast cancer. Continue taking anastrozole  (Arimidex ) daily - you can STOP your Arimidex  at the end of April 2026. You will be due for mammogram of your left breast in March 2026. We will see you for your final office visit and physical exam in about 6 months, but do not hesitate to reach out sooner if you have any questions or concerns.  BONE HEALTH: Since your breast cancer pill (anastrozole /Arimidex ) can cause weakened bones, we checked bone density scan in March 2024.  The scan showed strong and healthy bones.  We will recheck one last bone density scan in March 2026. Continue taking calcium  and vitamin D  to improve bone health. Continue weightbearing exercises to strengthen your bones.   ** Thank you for trusting me with your healthcare!  I strive to provide all of my patients with quality care at each visit.  If you receive a survey for this visit, I would be so grateful to you for taking the time to provide feedback.  Thank you in advance!  ~ Mayling Aber                                        Dr. Mickiel Davonna Pleasant Barefoot, PA-C        Delon Hope, NP   - - - - - - - - - - - - - - - - - -     Thank you for choosing [facility] at [facility] to provide your oncology and hematology care.  To afford each patient quality time with our provider, please arrive at least 15 minutes before your scheduled appointment time.   If you have a lab appointment with the Cancer Center please come in thru the Main Entrance and check in at the main information desk.  You need to re-schedule your appointment should you arrive 10 or more minutes late.  We strive to give you  quality time with our providers, and arriving late affects you and other patients whose appointments are after yours.  Also, if you no show three or more times for appointments you may be dismissed from the clinic at the providers discretion.     Again, thank you for choosing [facility].  Our hope is that these requests will decrease the amount of time that you wait before being seen by our physicians.       _____________________________________________________________  Should you have questions after your visit to [facility], please contact our office at [phone] and follow the prompts.  Our office hours are 8:00 a.m. and 4:30 p.m. Monday - Friday.  Please note that voicemails left after 4:00 p.m. may not be returned until the following business day.  We are closed weekends and major holidays.  You do have access to a nurse 24-7, just call the main number to the clinic [phone] and do not press any options, hold on the line and a nurse will answer the phone.    For prescription refill requests, have your pharmacy contact our office and  allow 72 hours.

## 2024-08-23 ENCOUNTER — Encounter: Payer: Self-pay | Admitting: *Deleted

## 2024-08-31 DIAGNOSIS — E785 Hyperlipidemia, unspecified: Secondary | ICD-10-CM

## 2024-09-22 ENCOUNTER — Other Ambulatory Visit: Payer: Self-pay | Admitting: *Deleted

## 2024-09-22 MED ORDER — ANASTROZOLE 1 MG PO TABS: 1.0000 mg | ORAL_TABLET | Freq: Every day | ORAL | 1 refills | Status: AC

## 2024-11-08 ENCOUNTER — Other Ambulatory Visit (HOSPITAL_COMMUNITY)

## 2024-11-08 ENCOUNTER — Ambulatory Visit (HOSPITAL_COMMUNITY)

## 2025-02-02 ENCOUNTER — Inpatient Hospital Stay

## 2025-02-09 ENCOUNTER — Inpatient Hospital Stay: Admitting: Physician Assistant

## 2025-07-13 ENCOUNTER — Encounter: Admitting: Family Medicine
